# Patient Record
Sex: Female | Born: 1945 | Race: White | Hispanic: No | Smoking: Never smoker
Health system: Southern US, Community
[De-identification: ages and names within clinical notes are randomized; demographics above are authoritative.]

## PROBLEM LIST (undated history)

## (undated) DIAGNOSIS — F329 Major depressive disorder, single episode, unspecified: Secondary | ICD-10-CM

## (undated) DIAGNOSIS — R413 Other amnesia: Secondary | ICD-10-CM

## (undated) DIAGNOSIS — M1990 Unspecified osteoarthritis, unspecified site: Secondary | ICD-10-CM

## (undated) DIAGNOSIS — K7460 Unspecified cirrhosis of liver: Secondary | ICD-10-CM

## (undated) DIAGNOSIS — E6609 Other obesity due to excess calories: Secondary | ICD-10-CM

## (undated) DIAGNOSIS — I639 Cerebral infarction, unspecified: Secondary | ICD-10-CM

## (undated) DIAGNOSIS — K760 Fatty (change of) liver, not elsewhere classified: Secondary | ICD-10-CM

## (undated) DIAGNOSIS — D649 Anemia, unspecified: Secondary | ICD-10-CM

## (undated) DIAGNOSIS — F419 Anxiety disorder, unspecified: Secondary | ICD-10-CM

## (undated) DIAGNOSIS — F319 Bipolar disorder, unspecified: Secondary | ICD-10-CM

## (undated) DIAGNOSIS — I209 Angina pectoris, unspecified: Secondary | ICD-10-CM

## (undated) DIAGNOSIS — R188 Other ascites: Secondary | ICD-10-CM

## (undated) DIAGNOSIS — K449 Diaphragmatic hernia without obstruction or gangrene: Secondary | ICD-10-CM

## (undated) DIAGNOSIS — I10 Essential (primary) hypertension: Secondary | ICD-10-CM

## (undated) DIAGNOSIS — R0789 Other chest pain: Secondary | ICD-10-CM

## (undated) DIAGNOSIS — R0602 Shortness of breath: Secondary | ICD-10-CM

## (undated) DIAGNOSIS — F32A Depression, unspecified: Secondary | ICD-10-CM

## (undated) DIAGNOSIS — Z9289 Personal history of other medical treatment: Secondary | ICD-10-CM

## (undated) DIAGNOSIS — K219 Gastro-esophageal reflux disease without esophagitis: Secondary | ICD-10-CM

## (undated) DIAGNOSIS — K254 Chronic or unspecified gastric ulcer with hemorrhage: Secondary | ICD-10-CM

## (undated) HISTORY — DX: Unspecified osteoarthritis, unspecified site: M19.90

## (undated) HISTORY — DX: Bipolar disorder, unspecified: F31.9

## (undated) HISTORY — DX: Other chest pain: R07.89

## (undated) HISTORY — DX: Essential (primary) hypertension: I10

## (undated) HISTORY — DX: Anxiety disorder, unspecified: F41.9

## (undated) HISTORY — DX: Depression, unspecified: F32.A

## (undated) HISTORY — DX: Major depressive disorder, single episode, unspecified: F32.9

## (undated) HISTORY — DX: Other obesity due to excess calories: E66.09

## (undated) HISTORY — PX: KNEE ARTHROSCOPY W/ ACL RECONSTRUCTION: SHX1858

## (undated) HISTORY — DX: Other amnesia: R41.3

## (undated) HISTORY — DX: Diaphragmatic hernia without obstruction or gangrene: K44.9

---

## 1975-12-13 HISTORY — PX: TUBAL LIGATION: SHX77

## 1978-12-12 HISTORY — PX: VAGINAL HYSTERECTOMY: SUR661

## 1988-12-12 HISTORY — PX: CARDIAC CATHETERIZATION: SHX172

## 1989-08-12 HISTORY — PX: SALPINGOOPHORECTOMY: SHX82

## 1989-08-12 HISTORY — PX: CARPAL TUNNEL RELEASE: SHX101

## 1998-04-27 ENCOUNTER — Encounter: Admission: RE | Admit: 1998-04-27 | Discharge: 1998-07-26 | Payer: Self-pay | Admitting: Specialist

## 1998-11-04 ENCOUNTER — Encounter: Payer: Self-pay | Admitting: Emergency Medicine

## 1998-11-05 ENCOUNTER — Inpatient Hospital Stay (HOSPITAL_COMMUNITY): Admission: EM | Admit: 1998-11-05 | Discharge: 1998-11-07 | Payer: Self-pay

## 2000-04-25 ENCOUNTER — Encounter: Payer: Self-pay | Admitting: Cardiology

## 2000-04-25 ENCOUNTER — Encounter: Admission: RE | Admit: 2000-04-25 | Discharge: 2000-04-25 | Payer: Self-pay | Admitting: Cardiology

## 2001-01-21 ENCOUNTER — Encounter: Payer: Self-pay | Admitting: Emergency Medicine

## 2001-01-21 ENCOUNTER — Emergency Department (HOSPITAL_COMMUNITY): Admission: EM | Admit: 2001-01-21 | Discharge: 2001-01-21 | Payer: Self-pay | Admitting: Emergency Medicine

## 2001-01-23 ENCOUNTER — Other Ambulatory Visit: Admission: RE | Admit: 2001-01-23 | Discharge: 2001-01-23 | Payer: Self-pay | Admitting: Obstetrics and Gynecology

## 2001-04-26 ENCOUNTER — Encounter: Payer: Self-pay | Admitting: Obstetrics and Gynecology

## 2001-04-26 ENCOUNTER — Encounter: Admission: RE | Admit: 2001-04-26 | Discharge: 2001-04-26 | Payer: Self-pay | Admitting: Obstetrics and Gynecology

## 2001-07-03 ENCOUNTER — Encounter: Payer: Self-pay | Admitting: Gastroenterology

## 2001-07-03 ENCOUNTER — Ambulatory Visit (HOSPITAL_COMMUNITY): Admission: RE | Admit: 2001-07-03 | Discharge: 2001-07-03 | Payer: Self-pay | Admitting: Gastroenterology

## 2001-07-18 ENCOUNTER — Ambulatory Visit (HOSPITAL_COMMUNITY): Admission: RE | Admit: 2001-07-18 | Discharge: 2001-07-18 | Payer: Self-pay | Admitting: Gastroenterology

## 2001-07-18 ENCOUNTER — Encounter (INDEPENDENT_AMBULATORY_CARE_PROVIDER_SITE_OTHER): Payer: Self-pay | Admitting: Specialist

## 2002-02-12 ENCOUNTER — Other Ambulatory Visit: Admission: RE | Admit: 2002-02-12 | Discharge: 2002-02-12 | Payer: Self-pay | Admitting: Obstetrics and Gynecology

## 2002-07-09 ENCOUNTER — Encounter: Admission: RE | Admit: 2002-07-09 | Discharge: 2002-07-09 | Payer: Self-pay | Admitting: Obstetrics and Gynecology

## 2002-07-09 ENCOUNTER — Encounter: Payer: Self-pay | Admitting: Obstetrics and Gynecology

## 2002-07-12 ENCOUNTER — Ambulatory Visit (HOSPITAL_COMMUNITY): Admission: RE | Admit: 2002-07-12 | Discharge: 2002-07-12 | Payer: Self-pay | Admitting: Gastroenterology

## 2003-02-07 ENCOUNTER — Encounter: Payer: Self-pay | Admitting: Rheumatology

## 2003-02-07 ENCOUNTER — Encounter: Admission: RE | Admit: 2003-02-07 | Discharge: 2003-02-07 | Payer: Self-pay | Admitting: Rheumatology

## 2003-03-13 ENCOUNTER — Other Ambulatory Visit: Admission: RE | Admit: 2003-03-13 | Discharge: 2003-03-13 | Payer: Self-pay | Admitting: Obstetrics and Gynecology

## 2003-06-10 DIAGNOSIS — R0789 Other chest pain: Secondary | ICD-10-CM

## 2003-06-10 HISTORY — DX: Other chest pain: R07.89

## 2003-07-23 ENCOUNTER — Encounter: Payer: Self-pay | Admitting: Internal Medicine

## 2003-07-23 ENCOUNTER — Encounter: Admission: RE | Admit: 2003-07-23 | Discharge: 2003-07-23 | Payer: Self-pay | Admitting: Internal Medicine

## 2004-06-04 ENCOUNTER — Other Ambulatory Visit: Admission: RE | Admit: 2004-06-04 | Discharge: 2004-06-04 | Payer: Self-pay | Admitting: Obstetrics and Gynecology

## 2004-06-28 ENCOUNTER — Encounter: Admission: RE | Admit: 2004-06-28 | Discharge: 2004-06-28 | Payer: Self-pay | Admitting: Cardiology

## 2004-09-20 ENCOUNTER — Encounter: Admission: RE | Admit: 2004-09-20 | Discharge: 2004-09-20 | Payer: Self-pay | Admitting: Internal Medicine

## 2005-04-05 ENCOUNTER — Encounter (INDEPENDENT_AMBULATORY_CARE_PROVIDER_SITE_OTHER): Payer: Self-pay | Admitting: *Deleted

## 2005-04-05 ENCOUNTER — Ambulatory Visit (HOSPITAL_COMMUNITY): Admission: RE | Admit: 2005-04-05 | Discharge: 2005-04-05 | Payer: Self-pay | Admitting: Gastroenterology

## 2006-01-06 ENCOUNTER — Other Ambulatory Visit: Admission: RE | Admit: 2006-01-06 | Discharge: 2006-01-06 | Payer: Self-pay | Admitting: Obstetrics and Gynecology

## 2006-01-10 ENCOUNTER — Encounter: Admission: RE | Admit: 2006-01-10 | Discharge: 2006-01-10 | Payer: Self-pay | Admitting: Internal Medicine

## 2006-04-03 ENCOUNTER — Ambulatory Visit (HOSPITAL_COMMUNITY): Admission: RE | Admit: 2006-04-03 | Discharge: 2006-04-03 | Payer: Self-pay | Admitting: Internal Medicine

## 2006-05-30 ENCOUNTER — Ambulatory Visit (HOSPITAL_BASED_OUTPATIENT_CLINIC_OR_DEPARTMENT_OTHER): Admission: RE | Admit: 2006-05-30 | Discharge: 2006-05-30 | Payer: Self-pay | Admitting: Orthopedic Surgery

## 2007-01-12 ENCOUNTER — Encounter: Admission: RE | Admit: 2007-01-12 | Discharge: 2007-01-12 | Payer: Self-pay | Admitting: Internal Medicine

## 2007-03-22 ENCOUNTER — Ambulatory Visit (HOSPITAL_BASED_OUTPATIENT_CLINIC_OR_DEPARTMENT_OTHER): Admission: RE | Admit: 2007-03-22 | Discharge: 2007-03-22 | Payer: Self-pay | Admitting: Orthopedic Surgery

## 2010-02-09 ENCOUNTER — Encounter: Admission: RE | Admit: 2010-02-09 | Discharge: 2010-02-09 | Payer: Self-pay | Admitting: Gastroenterology

## 2010-03-30 ENCOUNTER — Ambulatory Visit (HOSPITAL_COMMUNITY): Admission: RE | Admit: 2010-03-30 | Discharge: 2010-03-30 | Payer: Self-pay | Admitting: Gastroenterology

## 2010-10-25 ENCOUNTER — Ambulatory Visit: Payer: Self-pay | Admitting: Cardiology

## 2011-03-27 ENCOUNTER — Other Ambulatory Visit: Payer: Self-pay | Admitting: Cardiology

## 2011-03-27 DIAGNOSIS — I10 Essential (primary) hypertension: Secondary | ICD-10-CM

## 2011-03-28 NOTE — Telephone Encounter (Signed)
Refill request

## 2011-04-29 NOTE — Procedures (Signed)
[facility]  Patient:    [name], [name]                  MRN: [id number] Proc. Date: 07/18/01 Adm. Date:  [id number] Attending:  Louie Bun CC:         Clovis Pu Patty Sermons, M.D.   Procedure Report  PROCEDURE:  Esophagogastroduodenoscopy.  SURGEON:  John C. Madilyn Fireman, M.D.  INDICATIONS FOR PROCEDURE:  Chronic diarrhea with negative workup to date with increasing refractory gastroesophageal reflux symptoms in a patient who is also undergoing colonoscopy today for rectal bleeding and as part of her diarrhea workup.  DESCRIPTION OF PROCEDURE:  The patient was placed in the left lateral decubitus position, and placed on the pulse monitor with continuous low flow oxygen delivered by nasal cannula.  She was sedated with ___ mg of IV Demerol and 7 mg of IV Versed.  The Olympus video endoscope was advanced under direct vision into the oropharynx and esophagus.  The esophagus was straight and of normal caliber with the squamocolumnar line at 38 cm.  There was a single erosion extended about 2 cm proximal to the Z-line.  There was no visible hiatal hernia, ring, or stricture.  The stomach was entered and a small amount of liquid secretions were suctioned from the fundus.  Retroflexed view of the cardia was unremarkable.  The fundus and the body appeared normal.  Within the antrum, there were multiple small elevated areas of erythema and granularity and some areas of central dimpling with small central erosions, none more than 2 mm in diameter.  The surrounding erythema was also generally erythematous and granular consistent with antral gastritis.  A CLOtest was obtained.  The duodenum was entered, and both the bulb and second portion were well-inspected, and appeared to be within normal limits.  The scope was advanced as far as possible down into the duodenum, and small bowel biopsies were obtained.  The scope was then withdrawn and the patient  returned to the recovery room in stable condition.  She tolerated the procedure well and there were no immediate complications.  IMPRESSION: 1. Moderate antral gastritis. 2. Mild esophagitis.  PLAN:  Await biopsy results and proceed with colonoscopy. DD:  07/18/01 TD:  07/19/01 Job: [id number]

## 2011-04-29 NOTE — Op Note (Signed)
[name], [name]                    ACCOUNT NO.:  [id number]   MEDICAL RECORD NO.:  [id number]                   PATIENT TYPE:  AMB   LOCATION:  ENDO                                 FACILITY:  [facility]   PHYSICIAN:  Barrie Folk, M.D.                  DATE OF BIRTH:  28-Nov-1946   DATE OF PROCEDURE:  07/12/2002  DATE OF DISCHARGE:                                 OPERATIVE REPORT   PROCEDURE:  Esophagogastroduodenoscopy with esophageal dilatation and  biopsy.   INDICATIONS FOR PROCEDURE:  Acid reflux regurgitation and dysphagia only  partially responsive to a three week trial of proton pump inhibitor.   DESCRIPTION OF PROCEDURE:  The patient was placed in the left lateral  decubitus position and placed on the pulse monitor with continuous low-flow  oxygen delivered by nasal cannula.  She was sedated with 80 mg of IV Demerol  and 8 mg of IV Versed.  The Olympus video endoscope was advanced under  direct vision into the oropharynx and esophagus.  The esophagus was slightly  tortuous but of normal caliber.  The squamocolumnar line at 36 cm above a 3-  cm hiatal hernia.  There were two or three discreet erosions with a small  amount of exudate consistent with a moderate esophagitis.  There did appear  to be a very thin, widely patent lower esophageal ring which did not present  any resistance to passage of the scope beyond it.  The stomach was entered  and a small amount of liquid secretions were suctioned from the fundus.  Retroflex view of the cardia confirmed a hiatal hernia and was otherwise  unremarkable.  The fundus and body appeared normal.  The antrum showed  streaks of erythema and granularity consistent with mild antral gastritis.  A CLOtest was obtained.  The duodenum was entered and both bulb and second  portion well inspected and appeared to be within normal limits.  The scope  was advanced as far as possible down the distal duodenum and a guidewire  passed through  the scope.  The scope was withdrawn and an 18-mm Savary  dilator was passed under fluoroscopic visualization with minimal resistance  and no blood was seen on withdrawal of the dilator.  The patient was then  returned to the recovery room in stable condition.  She tolerated the  procedure well and there were no immediate complications.   IMPRESSION:  1. Moderate-sized hiatal hernia.  2. Moderate esophagitis.  3. Widely patent lower esophageal ring.  4. Mild antral gastritis.   PLAN:  1. Continue double-dose proton pump inhibitor.  2. Await CLOtest and treat for eradication if helicobacter if positive.  3. Advance diet and observe response to dilatation in terms of dysphagia.  4. Follow up with me in a few weeks.  Barrie Folk, M.D.   JCH/MEDQ  D:  07/12/2002  T:  07/18/2002  Job:  [id number]   cc:   Thomas A. Patty Sermons, M.D.

## 2011-04-29 NOTE — Op Note (Signed)
NAMEMEOSHIA, BILLING           ACCOUNT NO.:  [id number]   MEDICAL RECORD NO.:  [id number]          PATIENT TYPE:  AMB   LOCATION:  DSC                          FACILITY:  [facility]   PHYSICIAN:  Cindee Salt, M.D.       DATE OF BIRTH:  10/19/1946   DATE OF PROCEDURE:  05/30/2006  DATE OF DISCHARGE:                                 OPERATIVE REPORT   PREOPERATIVE DIAGNOSIS:  Carpal tunnel syndrome, right hand.   POSTOPERATIVE DIAGNOSIS:  Carpal tunnel syndrome, right hand.   OPERATION:  Decompression, right median nerve.   SURGEON:  Cindee Salt, M.D.   ASSISTANT:  __________.   ANESTHESIA:  General.   HISTORY:  The patient is a 65 year old female with a history of carpal  tunnel syndrome.  EMG nerve conductions are positive.  This is not  responding to conservative treatment.  She is admitted now for carpal tunnel  release.  She is aware of risks and complications.  They have been discussed  at length with her.  Questions are encouraged and answered.   In the preoperative area, the arm was marked by both the patient and  surgeon.   PROCEDURE:  Patient was brought to the operating room, where a general  anesthetic was carried out without difficulty.  She was prepped using  DuraPrep in a supine position, right arm free.  The forearm tourniquet was  used.  The limb was exsanguinated with an esmarch bandage.  The tourniquet  placed on the forearm, was inflated to 250 mmHg.  A longitudinal incision  was made in the palm, carried down through subcutaneous tissue.  The  bleeders were electrocauterized.  Palmar fascia was split.  Superficial  palmar arteries were identified.  The flexor tendon to the ring and little  finger identified to the ulnar side of the median nerve.  The carpal  retinaculum was incised with sharp dissection.  A right angle Sewall  retractor was placed between the skin and forearm fascia.  The fascia was  released for approximately 1.5 cm proximal to the wrist  crease under direct  vision.  The canal was explored.  No further lesions were identified.  The  area of compression to the nerve was immediately apparent.  The wound was  irrigated, and skin was then closed with interrupted 5-0 nylon sutures.  Sterile compressive dressing and a splint was applied.  Patient  tolerated  the procedure well and was taken to the recovery room for observation in  satisfactory condition.  She is discharged home to return to [facility]  in [place] in one week, on Vicodin.           ______________________________  Cindee Salt, M.D.     GK/MEDQ  D:  05/30/2006  T:  05/30/2006  Job:  [id number]

## 2011-04-29 NOTE — Op Note (Signed)
[name], [name]           ACCOUNT NO.:  [id number]   MEDICAL RECORD NO.:  [id number]          PATIENT TYPE:  AMB   LOCATION:  DSC                          FACILITY:  [facility]   PHYSICIAN:  Cindee Salt, M.D.       DATE OF BIRTH:  Apr 21, 1946   DATE OF PROCEDURE:  03/22/2007  DATE OF DISCHARGE:                               OPERATIVE REPORT   PREOPERATIVE DIAGNOSIS:  Carpal tunnel syndrome, left hand.   POSTOPERATIVE DIAGNOSIS:  Carpal tunnel syndrome, left hand.   OPERATION:  Decompression, left median nerve.   SURGEON:  Cindee Salt, MD   ASSISTANT:  None.   ANESTHESIA:  General.   ANESTHESIOLOGIST:  Zenon Mayo, MD.   HISTORY:  The patient is a 65 year old female with a history of carpal  tunnel syndrome, EMG nerve conductions positive, which has not responded  to conservative treatment.  She is desirous of decompression.  Preoperative, perioperative and postoperative course are well-discussed  with the patient.  She is aware there is no guarantee with the surgery,  possibility of infection, recurrence, injury to arteries, nerves,  tendons, incomplete relief of symptoms, dystrophy.  She has elected to  proceed to have this done, questions are encouraged and answered in the  preoperative area.  She is seen, the extremity marked by both the  patient and surgeon, the antibiotic given.   PROCEDURE:  The patient was brought to the operating room, where a  general anesthetic was carried out without difficulty.  She was prepped  using DuraPrep, supine position, left arm free.  After 3-minute dry time  she was draped.  The limb was exsanguinated with an Esmarch bandage.  The tourniquet placed on the forearm was inflated to 220 mmHg.  A  longitudinal incision was made in the palm, carried down through  subcutaneous tissue.  Bleeders were electrocauterized.  Palmar fascia  was split, superficial palmar arch identified, the flexor tendon to the  ring and little finger  identified to the ulnar side of the median nerve.  The carpal retinaculum was incised with sharp dissection.  A right angle  and Sewell retractor were placed between skin and forearm fascia, the  fascia released for approximately a centimeter and half proximal to the  wrist crease under direct vision.  The canal was explored.  Tenosynovial  tissue was thickened.  An area of compression of the nerve was apparent.  No other abnormalities were noted.  The wound was irrigated.  The skin  was then closed with interrupted 5-0 Vicryl Rapide sutures.  A sterile  compressive dressing and splint was applied.  The patient tolerated the  procedure well and was taken to the recovery room for observation in  satisfactory condition.  She is discharged home to return to the [facility] in 1 week on Vicodin.          ______________________________  Cindee Salt, M.D.    GK/MEDQ  D:  03/22/2007  T:  03/22/2007  Job:  [id number]

## 2011-04-29 NOTE — Procedures (Signed)
[facility]  Patient:    [name], [name]                  MRN: [id number] Proc. Date: 07/18/01 Adm. Date:  [id number] Attending:  Louie Bun CC:         Clovis Pu Patty Sermons, M.D.   Procedure Report  PROCEDURE:  Colonoscopy with biopsy.  INDICATION FOR PROCEDURE:  Rectal bleeding and chronic diarrhea in a 65 year old patient with no previous complete colon screening.  DESCRIPTION OF PROCEDURE:  The patient was placed in the left lateral decubitus position and placed on the pulse monitor with continuous low flow oxygen delivered by nasal cannula. She was sedated with 50 mg IV Demerol and 3 mg IV Versed in addition to the 75 mg Demerol and 7 mg Versed given for the previous EGD . The Olympus video colonoscope was inserted into the rectum and advanced to the cecum, confirmed by transillumination at McBurneys point and visualization of the ileocecal valve and appendiceal orifice. The prep was excellent. The terminal ileum was intubated for several centimeters and appeared normal. The cecum, ascending, transverse, descending and sigmoid colon all appeared normal with no masses, polyps, diverticula or other mucosal abnormalities. The rectum likewise appeared normal and retroflexed view of the anus revealed some small internal hemorrhoids. Biopsies were taken to rule out collagenous colitis. The scope was then withdrawn and the patient returned to the recovery room in stable condition. The patient tolerated the procedure well and there were no immediate complications.  IMPRESSION:  Internal hemorrhoids otherwise normal colonoscopy.  PLAN:  Will await biopsies from upper and lower endoscopy and proceed with further workup and treatment as indicated for chronic diarrhea. DD:  07/18/01 TD:  07/19/01 Job: [id number]

## 2011-04-29 NOTE — Op Note (Signed)
[name], [name]           ACCOUNT NO.:  [id number]   MEDICAL RECORD NO.:  [id number]          PATIENT TYPE:  AMB   LOCATION:  ENDO                         FACILITY:  [facility]   PHYSICIAN:  John C. Madilyn Fireman, M.D.    DATE OF BIRTH:  04-29-1946   DATE OF PROCEDURE:  04/05/2005  DATE OF DISCHARGE:                                 OPERATIVE REPORT   PROCEDURE:  Esophagogastroduodenoscopy with biopsy.   INDICATIONS FOR PROCEDURE:  Epigastric abdominal pain and other atypical  dyspeptic symptoms with abdominal cramps and diarrhea.   DESCRIPTION OF PROCEDURE:  The patient was placed in the left lateral  decubitus position then placed on pulse monitor with continuous low-flow  oxygen delivered by nasal cannula. She was sedated with 75 mcg IV fentanyl  and 6 mg IV Versed. The Olympus video endoscope was advanced under direct  vision into the oropharynx and esophagus. The esophagus was straight and of  normal caliber with the squamocolumnar line at 36 cm above a 4 cm fixed  hiatal hernia. There was a little bit of fibrosis at the GE junction but no  significant restriction of the diameter at that point.  I did not appreciate  __________ was entered and a small amount of liquid secretions were  suctioned from the fundus. Retroflexed view of the cardia confirmed a hiatal  hernia and was otherwise unremarkable. The fundus and body appeared normal.  The antrum showed streaks and patches of erythema with 1 or 2 tiny erosions  without any exudate consistent with gastritis. A CLOtest was obtained. The  pylorus was nondeformed and easily allowed passage of the endoscope tip into  the duodenum. Both the bulb and second portion were well inspected appeared  to be within normal limits. Biopsies were taken of the duodenum to rule out  celiac disease light of her diarrhea and gas. The scope was then withdrawn  and the patient returned to the recovery room in stable condition. She  tolerated the procedure  well and there were no immediate complications.   IMPRESSION:  1.  Large hiatal hernia.  2.  Gastritis.   PLAN:  Will double her proton pump inhibitor dose and await CLOtest and  biopsies.      JCH/MEDQ  D:  04/05/2005  T:  04/05/2005  Job:  [id number]   cc:   Cassell Clement, M.D.  [address]  [place]  [place] [address]  Fax: [phone]   Gwen Pounds, MD  Fax: [phone]

## 2011-07-15 ENCOUNTER — Telehealth: Payer: Self-pay | Admitting: Cardiology

## 2011-07-15 NOTE — Telephone Encounter (Signed)
Called patient regarding appointment at [phone].  Number has been disconnected.

## 2011-08-01 ENCOUNTER — Other Ambulatory Visit: Payer: Self-pay | Admitting: Gastroenterology

## 2011-10-14 ENCOUNTER — Other Ambulatory Visit: Payer: Self-pay | Admitting: Cardiology

## 2011-10-14 NOTE — Telephone Encounter (Signed)
Refilled nadolol

## 2011-11-09 ENCOUNTER — Encounter: Payer: Self-pay | Admitting: Cardiology

## 2011-11-09 ENCOUNTER — Ambulatory Visit (INDEPENDENT_AMBULATORY_CARE_PROVIDER_SITE_OTHER): Payer: Medicare Other | Admitting: Cardiology

## 2011-11-09 DIAGNOSIS — R079 Chest pain, unspecified: Secondary | ICD-10-CM | POA: Insufficient documentation

## 2011-11-09 DIAGNOSIS — I119 Hypertensive heart disease without heart failure: Secondary | ICD-10-CM

## 2011-11-09 DIAGNOSIS — F411 Generalized anxiety disorder: Secondary | ICD-10-CM

## 2011-11-09 DIAGNOSIS — I10 Essential (primary) hypertension: Secondary | ICD-10-CM

## 2011-11-09 DIAGNOSIS — F419 Anxiety disorder, unspecified: Secondary | ICD-10-CM

## 2011-11-09 MED ORDER — DILTIAZEM HCL 60 MG PO TABS: 60.0000 mg | ORAL_TABLET | Freq: Two times a day (BID) | ORAL | Status: DC

## 2011-11-09 MED ORDER — NADOLOL 20 MG PO TABS: 20.0000 mg | ORAL_TABLET | Freq: Every day | ORAL | Status: DC

## 2011-11-09 NOTE — Progress Notes (Signed)
Rodolph Bong Date of Birth:  Dec 15, 1945 [facility] [address]   [address] [place], [place]  [address] [phone]           Fax   [phone]  HPI: This pleasant 65 year old woman is seen for a scheduled followup office visit she has a history of atypical chest pain.  She does not have any history of known coronary disease.  He had cardiac catheterization in 1990 showed normal coronary arteries and normal LV function.  Her last nuclear stress test was in 2004 and was a 2 day study showing no evidence of ischemia and showing normal LV function.  Patient has occasional chest tightness relieved by rest.  She has not had to take any sublingual nitroglycerin.  The patient has a history of exogenous obesity, anxiety and depression, and essential hypertension and palpitations.  Current Outpatient Prescriptions  Medication Sig Dispense Refill  . aspirin 81 MG tablet Take 81 mg by mouth daily. Takes three daily       . diltiazem (CARDIZEM) 60 MG tablet Take 1 tablet (60 mg total) by mouth 2 (two) times daily.  180 tablet  3  . doxepin (SINEQUAN) 75 MG capsule Take 75 mg by mouth.        Marland Kitchen FLUoxetine (PROZAC) 40 MG capsule Take 40 mg by mouth 2 (two) times daily.        Marland Kitchen LORAZEPAM PO Take by mouth. As needed       . Melatonin 5 MG TABS Take by mouth daily.        . metoCLOPramide (REGLAN) 10 MG tablet Take 10 mg by mouth daily.        . nadolol (CORGARD) 20 MG tablet Take 1 tablet (20 mg total) by mouth daily.  90 tablet  3  . DISCONTD: diltiazem (CARDIZEM) 60 MG tablet TAKE ONE TABLET BY MOUTH TWICE DAILY  60 tablet  11    Allergies  Allergen Reactions  . Sulfa Antibiotics     Patient Active Problem List  Diagnoses  . Benign hypertensive heart disease without heart failure  . Chest pain  . Anxiety    History  Smoking status  . Never Smoker   Smokeless tobacco  . Not on file    History  Alcohol Use: Not on file    No family history on  file.  Review of Systems: The patient denies any heat or cold intolerance.  No weight gain or weight loss.  The patient denies headaches or blurry vision.  There is no cough or sputum production.  The patient denies dizziness.  There is no hematuria or hematochezia.  The patient denies any muscle aches or arthritis.  The patient denies any rash.  The patient denies frequent falling or instability.  There is no history of depression or anxiety.  All other systems were reviewed and are negative.   Physical Exam: Filed Vitals:   11/09/11 1610  BP: 128/80  Pulse: 77   the general appearance reveals a large woman in no distress.  I rechecked her blood pressure in the right arm with a large cuff and it was 110/70.Pupils equal and reactive.   Extraocular Movements are full.  There is no scleral icterus.  The mouth and pharynx are normal.  The neck is supple.  The carotids reveal no bruits.  The jugular venous pressure is normal.  The thyroid is not enlarged.  There is no lymphadenopathy.  The chest is clear to percussion  and auscultation. There are no rales or rhonchi. Expansion of the chest is symmetrical.  The precordium is quiet.  The first heart sound is normal.  The second heart sound is physiologically split.  There is no murmur gallop rub or click.  There is no abnormal lift or heave.  The abdomen is soft and nontender. Bowel sounds are normal. The liver and spleen are not enlarged. There Are no abdominal masses. There are no bruits.  The pedal pulses are good.  There is no phlebitis or edema.  There is no cyanosis or clubbing. Neurologic is unremarkable.  Patient appears to be quite anxious.The skin is warm and dry.  There is no rash.  EKG shows normal sinus rhythm and is within normal limits    Assessment / Plan:  Continue same medication.  We sent her in new 90 day prescriptions for her diltiazem and her nadolol.  Recheck in 6 months for a followup office visit

## 2011-11-09 NOTE — Assessment & Plan Note (Signed)
The patient is on multiple psychotropic drugs and Dr. Timothy Lasso has been working with the patient to achieve the proper balance of medications.

## 2011-11-09 NOTE — Assessment & Plan Note (Signed)
The patient's blood pressure is remaining stable on current medication.  The patient is on a careful weight reduction diet and has lost 18 pounds since we last saw her.

## 2011-11-09 NOTE — Patient Instructions (Signed)
Your physician recommends that you continue on your current medications as directed. Please refer to the Current Medication list given to you today.  Your physician wants you to follow-up in: 6 months. You will receive a reminder letter in the mail two months in advance. If you don't receive a letter, please call our office to schedule the follow-up appointment.  

## 2011-11-09 NOTE — Assessment & Plan Note (Signed)
The patient has occasional chest tightness relieved by rest.  She has not had any discomfort severe enough to make her want to take her nitroglycerin.  Her electrocardiogram today is within normal limits

## 2012-05-03 ENCOUNTER — Encounter: Payer: Self-pay | Admitting: *Deleted

## 2012-05-03 DIAGNOSIS — F32A Depression, unspecified: Secondary | ICD-10-CM | POA: Insufficient documentation

## 2012-05-03 DIAGNOSIS — R413 Other amnesia: Secondary | ICD-10-CM | POA: Insufficient documentation

## 2012-05-03 DIAGNOSIS — M1990 Unspecified osteoarthritis, unspecified site: Secondary | ICD-10-CM | POA: Insufficient documentation

## 2012-05-03 DIAGNOSIS — F319 Bipolar disorder, unspecified: Secondary | ICD-10-CM | POA: Insufficient documentation

## 2012-05-03 DIAGNOSIS — E6609 Other obesity due to excess calories: Secondary | ICD-10-CM | POA: Insufficient documentation

## 2012-05-03 DIAGNOSIS — K449 Diaphragmatic hernia without obstruction or gangrene: Secondary | ICD-10-CM | POA: Insufficient documentation

## 2012-05-03 DIAGNOSIS — R0789 Other chest pain: Secondary | ICD-10-CM | POA: Insufficient documentation

## 2012-05-03 DIAGNOSIS — F329 Major depressive disorder, single episode, unspecified: Secondary | ICD-10-CM | POA: Insufficient documentation

## 2012-05-17 ENCOUNTER — Ambulatory Visit (INDEPENDENT_AMBULATORY_CARE_PROVIDER_SITE_OTHER): Payer: Medicare Other | Admitting: Cardiology

## 2012-05-17 ENCOUNTER — Encounter: Payer: Self-pay | Admitting: Cardiology

## 2012-05-17 VITALS — BP 118/82 | HR 80 | Ht 66.0 in | Wt 270.0 lb

## 2012-05-17 DIAGNOSIS — F329 Major depressive disorder, single episode, unspecified: Secondary | ICD-10-CM

## 2012-05-17 DIAGNOSIS — F341 Dysthymic disorder: Secondary | ICD-10-CM

## 2012-05-17 DIAGNOSIS — R079 Chest pain, unspecified: Secondary | ICD-10-CM

## 2012-05-17 DIAGNOSIS — I119 Hypertensive heart disease without heart failure: Secondary | ICD-10-CM

## 2012-05-17 NOTE — Patient Instructions (Signed)
Your physician recommends that you continue on your current medications as directed. Please refer to the Current Medication list given to you today.  Your physician wants you to follow-up in: 6 months. You will receive a reminder letter in the mail two months in advance. If you don't receive a letter, please call our office to schedule the follow-up appointment.  

## 2012-05-17 NOTE — Assessment & Plan Note (Signed)
Patient remains extremely anxious.  She is no longer seeing psychiatry.  Dr. Timothy Lasso is helping her with her nerve medication now.

## 2012-05-17 NOTE — Progress Notes (Signed)
[name] Date of Birth:  Dec 17, 1945 [facility] [address] [place], [place]  [address] [phone]  Fax   [phone]  HPI: This pleasant 66 year old woman is seen for a six-month followup office visit.  She has a past history of atypical chest pain.  She does not have any history of known coronary disease.  She did have a normal cardiac catheterization in 1990.  She had a normal nuclear stress test in 2004.  She has normal LV function.  Patient has a problem with severe anxiety and depression as well as exogenous obesity and she has a history of essential hypertension and palpitations.  Current Outpatient Prescriptions  Medication Sig Dispense Refill  . aspirin 81 MG tablet Take 81 mg by mouth daily. Takes three daily       . diltiazem (CARDIZEM) 60 MG tablet Take 1 tablet (60 mg total) by mouth 2 (two) times daily.  180 tablet  3  . doxepin (SINEQUAN) 75 MG capsule Take 75 mg by mouth.        Marland Kitchen FLUoxetine (PROZAC) 40 MG capsule Take 40 mg by mouth 2 (two) times daily. Per Dr.Pulis      . hydrochlorothiazide (HYDRODIURIL) 25 MG tablet Take 25 mg by mouth daily. As needed      . LORAZEPAM PO Take by mouth. As needed       . Melatonin 5 MG TABS Take by mouth daily.        . metoCLOPramide (REGLAN) 10 MG tablet Take 10 mg by mouth daily.        . Multiple Vitamin (MULTIVITAMIN) tablet Take 1 tablet by mouth daily.      . nadolol (CORGARD) 20 MG tablet Take 1 tablet (20 mg total) by mouth daily.  90 tablet  3    Allergies  Allergen Reactions  . Sulfa Antibiotics     Patient Active Problem List  Diagnoses  . Benign hypertensive heart disease without heart failure  . Chest pain  . Anxiety  . Exogenous obesity  . Anxiety and depression  . Hiatal hernia  . Osteoarthritis  . Atypical chest pain  . Memory changes  . Bipolar disorder    History  Smoking status  . Never Smoker   Smokeless tobacco  . Not on file    History  Alcohol Use:  Not on file    Family History  Problem Relation Age of Onset  . Heart attack Mother   . Heart disease Mother   . Heart disease Father   . Heart attack Father     Review of Systems: The patient denies any heat or cold intolerance.  No weight gain or weight loss.  The patient denies headaches or blurry vision.  There is no cough or sputum production.  The patient denies dizziness.  There is no hematuria or hematochezia.  The patient denies any muscle aches or arthritis.  The patient denies any rash.  The patient denies frequent falling or instability.  There is no history of depression or anxiety.  All other systems were reviewed and are negative.   Physical Exam: Filed Vitals:   05/17/12 1634  BP: 118/82  Pulse: 80   the general appearance reveals a large woman in no acute distress.The head and neck exam reveals pupils equal and reactive.  Extraocular movements are full.  There is no scleral icterus.  The mouth and pharynx are normal.  The neck is supple.  The carotids reveal no bruits.  The jugular venous pressure is normal.  The  thyroid is not enlarged.  There is no lymphadenopathy.  The chest is clear to percussion and auscultation.  There are no rales or rhonchi.  Expansion of the chest is symmetrical.  There is no localized chest wall tenderness.  The precordium is quiet.  The first heart sound is normal.  The second heart sound is physiologically split.  There is no murmur gallop rub or click.  There is no abnormal lift or heave.  The abdomen is soft and nontender.  The bowel sounds are normal.  The liver and spleen are not enlarged.  There are no abdominal masses.  There are no abdominal bruits.  Extremities reveal good pedal pulses.  There is no phlebitis or edema.  There is no cyanosis or clubbing.  Strength is normal and symmetrical in all extremities.  There is no lateralizing weakness.  There are no sensory deficits.  The skin is warm and dry.  There is no rash.      Assessment  / Plan: Noncardiac chest wall pain.  Patient was reassured.  She has gained 14 pounds since last visit and is going to try harder with her diet.  She'll return in 6 months for followup office visit.

## 2012-05-17 NOTE — Assessment & Plan Note (Signed)
The patient has a history of atypical chest pain.  Her pains often occur at rest and are not related to any particular activity.  The pain is localized under the left breast.  It lasts anywhere from 1 second to 1 minute and is relieved by lying down.  There is no arm radiation no diaphoresis and there is no increased shortness of breath.  She has not been having any dizziness or syncope.

## 2012-05-17 NOTE — Assessment & Plan Note (Signed)
Pressure has been remaining stable on current therapy.  No headaches.  No change of vision.  No dizziness or syncope.

## 2012-08-20 ENCOUNTER — Telehealth: Payer: Self-pay | Admitting: Cardiology

## 2012-08-20 DIAGNOSIS — I119 Hypertensive heart disease without heart failure: Secondary | ICD-10-CM

## 2012-08-20 NOTE — Telephone Encounter (Signed)
New problem.    Nadolol is too expensive. Please advise on alternative.

## 2012-08-20 NOTE — Telephone Encounter (Signed)
Left msg with pt home number that DR/Nurse out of office and will return call with recommendations, this is not a new med, pt has been on medication for a long time.

## 2012-08-21 ENCOUNTER — Telehealth: Payer: Self-pay | Admitting: Cardiology

## 2012-08-21 ENCOUNTER — Telehealth: Payer: Self-pay | Admitting: *Deleted

## 2012-08-21 MED ORDER — NADOLOL 20 MG PO TABS: 20.0000 mg | ORAL_TABLET | Freq: Every day | ORAL | Status: DC

## 2012-08-21 NOTE — Telephone Encounter (Signed)
Pt would like a return call to discuss possible med alternatives, she can be reached at [phone]

## 2012-08-21 NOTE — Telephone Encounter (Signed)
Left message at [facility], Rx called to another pharmacy

## 2012-08-21 NOTE — Telephone Encounter (Signed)
New Problem:    Patient called in wanting to speak with you about her prescription.  Patient would prefer to have her prescription filled at the [facility] on [address]. Please call back if you have any questions.

## 2012-08-21 NOTE — Telephone Encounter (Signed)
Left message to call [facility] she wants it to be filled and they can get it transferred (not sure which [facility] she wanted it filled at)

## 2012-08-21 NOTE — Telephone Encounter (Signed)
Pt pharmacy called wanting an alternate instead of nadolol please call back this number [phone].

## 2012-11-14 ENCOUNTER — Other Ambulatory Visit: Payer: Self-pay

## 2012-11-14 DIAGNOSIS — I119 Hypertensive heart disease without heart failure: Secondary | ICD-10-CM

## 2012-11-14 DIAGNOSIS — I10 Essential (primary) hypertension: Secondary | ICD-10-CM

## 2012-11-14 MED ORDER — DILTIAZEM HCL 60 MG PO TABS: 60.0000 mg | ORAL_TABLET | Freq: Two times a day (BID) | ORAL | Status: DC

## 2012-11-14 MED ORDER — METOPROLOL TARTRATE 25 MG PO TABS
25.0000 mg | ORAL_TABLET | Freq: Two times a day (BID) | ORAL | Status: DC
Start: 2012-11-14 — End: 2013-04-21

## 2012-11-14 NOTE — Telephone Encounter (Signed)
1) Nadolol is too expensive now would like alternative, will forward to  Dr. Patty Sermons for review  2)Needs refill of diltiazem

## 2012-11-14 NOTE — Telephone Encounter (Signed)
Advised patient

## 2012-11-14 NOTE — Telephone Encounter (Signed)
Stop nadolol and began generic Lopressor 25 mg twice a day.

## 2012-11-15 ENCOUNTER — Other Ambulatory Visit: Payer: Self-pay

## 2013-01-29 ENCOUNTER — Encounter: Payer: Self-pay | Admitting: Cardiology

## 2013-04-21 ENCOUNTER — Emergency Department (HOSPITAL_COMMUNITY): Payer: Medicare Other

## 2013-04-21 ENCOUNTER — Inpatient Hospital Stay (HOSPITAL_COMMUNITY)
Admission: EM | Admit: 2013-04-21 | Discharge: 2013-04-25 | DRG: 378 | Disposition: A | Discharge status: Skilled Nursing Facility | Payer: Medicare Other | Attending: Internal Medicine | Admitting: Internal Medicine

## 2013-04-21 ENCOUNTER — Encounter (HOSPITAL_COMMUNITY): Payer: Self-pay | Admitting: *Deleted

## 2013-04-21 DIAGNOSIS — I119 Hypertensive heart disease without heart failure: Secondary | ICD-10-CM | POA: Diagnosis present

## 2013-04-21 DIAGNOSIS — F319 Bipolar disorder, unspecified: Secondary | ICD-10-CM | POA: Diagnosis present

## 2013-04-21 DIAGNOSIS — K2960 Other gastritis without bleeding: Secondary | ICD-10-CM | POA: Diagnosis present

## 2013-04-21 DIAGNOSIS — M1710 Unilateral primary osteoarthritis, unspecified knee: Secondary | ICD-10-CM | POA: Diagnosis present

## 2013-04-21 DIAGNOSIS — S0990XA Unspecified injury of head, initial encounter: Secondary | ICD-10-CM

## 2013-04-21 DIAGNOSIS — Z6841 Body Mass Index (BMI) 40.0 and over, adult: Secondary | ICD-10-CM

## 2013-04-21 DIAGNOSIS — Z8249 Family history of ischemic heart disease and other diseases of the circulatory system: Secondary | ICD-10-CM

## 2013-04-21 DIAGNOSIS — R296 Repeated falls: Secondary | ICD-10-CM

## 2013-04-21 DIAGNOSIS — R531 Weakness: Secondary | ICD-10-CM

## 2013-04-21 DIAGNOSIS — Z79899 Other long term (current) drug therapy: Secondary | ICD-10-CM

## 2013-04-21 DIAGNOSIS — F329 Major depressive disorder, single episode, unspecified: Secondary | ICD-10-CM | POA: Diagnosis present

## 2013-04-21 DIAGNOSIS — D649 Anemia, unspecified: Secondary | ICD-10-CM

## 2013-04-21 DIAGNOSIS — Z8673 Personal history of transient ischemic attack (TIA), and cerebral infarction without residual deficits: Secondary | ICD-10-CM

## 2013-04-21 DIAGNOSIS — D62 Acute posthemorrhagic anemia: Secondary | ICD-10-CM | POA: Diagnosis present

## 2013-04-21 DIAGNOSIS — I639 Cerebral infarction, unspecified: Secondary | ICD-10-CM | POA: Insufficient documentation

## 2013-04-21 DIAGNOSIS — B3781 Candidal esophagitis: Secondary | ICD-10-CM | POA: Diagnosis present

## 2013-04-21 DIAGNOSIS — F411 Generalized anxiety disorder: Secondary | ICD-10-CM | POA: Diagnosis present

## 2013-04-21 DIAGNOSIS — K254 Chronic or unspecified gastric ulcer with hemorrhage: Principal | ICD-10-CM | POA: Diagnosis present

## 2013-04-21 DIAGNOSIS — F3289 Other specified depressive episodes: Secondary | ICD-10-CM | POA: Diagnosis present

## 2013-04-21 DIAGNOSIS — W19XXXA Unspecified fall, initial encounter: Secondary | ICD-10-CM

## 2013-04-21 DIAGNOSIS — K922 Gastrointestinal hemorrhage, unspecified: Secondary | ICD-10-CM

## 2013-04-21 DIAGNOSIS — Z9181 History of falling: Secondary | ICD-10-CM

## 2013-04-21 DIAGNOSIS — K5900 Constipation, unspecified: Secondary | ICD-10-CM | POA: Diagnosis present

## 2013-04-21 DIAGNOSIS — K449 Diaphragmatic hernia without obstruction or gangrene: Secondary | ICD-10-CM | POA: Diagnosis present

## 2013-04-21 DIAGNOSIS — Z90710 Acquired absence of both cervix and uterus: Secondary | ICD-10-CM

## 2013-04-21 HISTORY — DX: Cerebral infarction, unspecified: I63.9

## 2013-04-21 LAB — OCCULT BLOOD, POC DEVICE: Fecal Occult Bld: POSITIVE — AB

## 2013-04-21 LAB — CBC WITH DIFFERENTIAL/PLATELET
Eosinophils Absolute: 0.1 10*3/uL (ref 0.0–0.7)
Eosinophils Relative: 3 % (ref 0–5)
HCT: 27.7 % — ABNORMAL LOW (ref 36.0–46.0)
Hemoglobin: 8.5 g/dL — ABNORMAL LOW (ref 12.0–15.0)
Lymphocytes Relative: 19 % (ref 12–46)
Lymphs Abs: 0.9 10*3/uL (ref 0.7–4.0)
MCHC: 30.7 g/dL (ref 30.0–36.0)
Monocytes Relative: 9 % (ref 3–12)
Neutro Abs: 3 10*3/uL (ref 1.7–7.7)

## 2013-04-21 LAB — URINALYSIS, ROUTINE W REFLEX MICROSCOPIC
Glucose, UA: NEGATIVE mg/dL
Protein, ur: NEGATIVE mg/dL
Specific Gravity, Urine: 1.041 — ABNORMAL HIGH (ref 1.005–1.030)

## 2013-04-21 LAB — URINE MICROSCOPIC-ADD ON

## 2013-04-21 LAB — PREPARE RBC (CROSSMATCH)

## 2013-04-21 LAB — BASIC METABOLIC PANEL
BUN: 17 mg/dL (ref 6–23)
Chloride: 107 mEq/L (ref 96–112)
GFR calc non Af Amer: 87 mL/min — ABNORMAL LOW (ref >90)

## 2013-04-21 MED ORDER — ENOXAPARIN SODIUM 40 MG/0.4ML ~~LOC~~ SOLN
40.0000 mg | SUBCUTANEOUS | Status: DC
  Filled 2013-04-21 (×2): qty 0.4

## 2013-04-21 MED ORDER — ASPIRIN 81 MG PO CHEW
81.0000 mg | CHEWABLE_TABLET | Freq: Every day | ORAL | Status: DC
  Administered 2013-04-21: 81 mg via ORAL
  Filled 2013-04-21 (×2): qty 1

## 2013-04-21 MED ORDER — PANTOPRAZOLE SODIUM 40 MG PO TBEC: 40.0000 mg | DELAYED_RELEASE_TABLET | Freq: Two times a day (BID) | ORAL | Status: DC

## 2013-04-21 MED ORDER — SODIUM CHLORIDE 0.9 % IV BOLUS (SEPSIS): 1000.0000 mL | Freq: Once | INTRAVENOUS | Status: DC

## 2013-04-21 MED ORDER — DILTIAZEM HCL 60 MG PO TABS
60.0000 mg | ORAL_TABLET | Freq: Two times a day (BID) | ORAL | Status: DC
  Administered 2013-04-21 – 2013-04-25 (×8): 60 mg via ORAL
  Filled 2013-04-21 (×9): qty 1

## 2013-04-21 MED ORDER — NADOLOL 20 MG PO TABS
20.0000 mg | ORAL_TABLET | Freq: Every day | ORAL | Status: DC
  Administered 2013-04-21 – 2013-04-25 (×5): 20 mg via ORAL
  Filled 2013-04-21 (×5): qty 1

## 2013-04-21 MED ORDER — ACETAMINOPHEN 650 MG RE SUPP: 650.0000 mg | Freq: Four times a day (QID) | RECTAL | Status: DC | PRN

## 2013-04-21 MED ORDER — METOCLOPRAMIDE HCL 10 MG PO TABS
10.0000 mg | ORAL_TABLET | Freq: Every day | ORAL | Status: DC
  Administered 2013-04-21 – 2013-04-25 (×4): 10 mg via ORAL
  Filled 2013-04-21 (×5): qty 1

## 2013-04-21 MED ORDER — DOXEPIN HCL 75 MG PO CAPS
75.0000 mg | ORAL_CAPSULE | Freq: Every day | ORAL | Status: DC
  Administered 2013-04-21 – 2013-04-24 (×4): 75 mg via ORAL
  Filled 2013-04-21 (×5): qty 1

## 2013-04-21 MED ORDER — LORAZEPAM 1 MG PO TABS
1.0000 mg | ORAL_TABLET | Freq: Two times a day (BID) | ORAL | Status: DC
  Administered 2013-04-21 – 2013-04-24 (×6): 1 mg via ORAL
  Filled 2013-04-21 (×6): qty 1

## 2013-04-21 MED ORDER — POLYETHYLENE GLYCOL 3350 17 G PO PACK
17.0000 g | PACK | Freq: Every day | ORAL | Status: DC | PRN
  Administered 2013-04-24: 17 g via ORAL
  Filled 2013-04-21: qty 1

## 2013-04-21 MED ORDER — FLUOXETINE HCL 20 MG PO CAPS
40.0000 mg | ORAL_CAPSULE | Freq: Two times a day (BID) | ORAL | Status: DC
  Administered 2013-04-21 – 2013-04-25 (×8): 40 mg via ORAL
  Filled 2013-04-21 (×4): qty 2
  Filled 2013-04-21: qty 1
  Filled 2013-04-21 (×5): qty 2

## 2013-04-21 MED ORDER — ZOLPIDEM TARTRATE 5 MG PO TABS
5.0000 mg | ORAL_TABLET | Freq: Every evening | ORAL | Status: DC | PRN
  Filled 2013-04-21: qty 1

## 2013-04-21 MED ORDER — ADULT MULTIVITAMIN W/MINERALS CH
1.0000 | ORAL_TABLET | Freq: Every day | ORAL | Status: DC
  Administered 2013-04-21 – 2013-04-25 (×4): 1 via ORAL
  Filled 2013-04-21 (×5): qty 1

## 2013-04-21 MED ORDER — SODIUM CHLORIDE 0.9 % IV SOLN
INTRAVENOUS | Status: DC
  Administered 2013-04-23: 13:00:00 via INTRAVENOUS

## 2013-04-21 MED ORDER — ACETAMINOPHEN 325 MG PO TABS
650.0000 mg | ORAL_TABLET | Freq: Four times a day (QID) | ORAL | Status: DC | PRN
  Administered 2013-04-22: 650 mg via ORAL
  Filled 2013-04-21: qty 2

## 2013-04-21 MED ORDER — ASPIRIN 81 MG PO TABS: 81.0000 mg | ORAL_TABLET | Freq: Every day | ORAL | Status: DC

## 2013-04-21 MED ORDER — ONE-DAILY MULTI VITAMINS PO TABS: 1.0000 | ORAL_TABLET | Freq: Every day | ORAL | Status: DC

## 2013-04-21 MED ORDER — HYDROCODONE-ACETAMINOPHEN 5-325 MG PO TABS
2.0000 | ORAL_TABLET | Freq: Once | ORAL | Status: AC
  Administered 2013-04-21: 2 via ORAL
  Filled 2013-04-21: qty 2

## 2013-04-21 MED ORDER — FUROSEMIDE 10 MG/ML IJ SOLN
20.0000 mg | Freq: Once | INTRAMUSCULAR | Status: AC
  Administered 2013-04-21: 20 mg via INTRAVENOUS
  Filled 2013-04-21: qty 2

## 2013-04-21 NOTE — ED Notes (Signed)
Pt states she would prefer not to get an IV, MD Bednar aware.

## 2013-04-21 NOTE — ED Notes (Signed)
Pt in via EMS, per EMS, pt in after multiple falls in the last two days, pt fell last night around 10pm and didn't call anyone, neighbor found her this am. Pt is concerned that family will put her in a nursing home so she didn't call anyone last night. Pt states she hit her head but denies LOC. Pt was on the floor for approx 14 hours. Also c/o room spinning when she was sat up the first time and states that is a new sensation. Pt alert and oriented at this time. States she normally gets around at home with a walker but if she falls she is unable to get herself back up.

## 2013-04-21 NOTE — ED Provider Notes (Signed)
History     CSN: [id number]  Arrival date & time 04/21/13  1247   First MD Initiated Contact with Patient 04/21/13 1249      Chief Complaint  Patient presents with  . Fall    (Consider location/radiation/quality/duration/timing/severity/associated sxs/prior treatment) HPI This 67 year old female has baseline is morbidly obese and has generalized weakness with multiple falls, she has a walker at home, she wants to continue living alone, she does not have any life alert notification system but her neighbor checks on her once a day, last night her knees buckled when she was walking which is typical for her and she fell and did not injure herself except for hitting her head which caused some mild intermittent positional vertigo with only mild headache with no severe headache no amnesia no loss of consciousness no neck pain no change in her chronic low back pain no chest pain no shortness breath no pelvic pain no vomiting no diarrhea no change in speech vision swallowing or understanding and no new focal or lateralizing weakness numbness or incoordination but since she is too weak at baseline to get up from the floor when she falls she did weight on the floor all night until her neighbor checked on her today and to call EMS to pick the patient up because she is so heavy. When the patient sat up with EMS and moved her head she felt as if the room was spinning a little bit with some mild intermittent positional vertigo which she has had since hitting her head when she fell. She did not have vertigo before falling and hitting her head. She is no severe headache. There is no treatment prior to arrival. Past Medical History  Diagnosis Date  . Exogenous obesity   . Hypertension   . Anxiety and depression     chronic  . Hiatal hernia   . Osteoarthritis   . Atypical chest pain 06/10/03    normal 2 day adenosine cardiolite  . Memory changes   . Bipolar disorder     Past Surgical History  Procedure  Laterality Date  . Tubal ligation  1977  . Other surgical history  1980    hysterectomy  . Cardiac catheterization  1990    normal cath  . Esophagogastroduodenoscopy N/A 04/22/2013    Procedure: ESOPHAGOGASTRODUODENOSCOPY (EGD);  Surgeon: Shirley Friar, MD;  Location: [facility] ENDOSCOPY;  Service: Endoscopy;  Laterality: N/A;    Family History  Problem Relation Age of Onset  . Heart attack Mother   . Heart disease Mother   . Heart disease Father   . Heart attack Father     History  Substance Use Topics  . Smoking status: Never Smoker   . Smokeless tobacco: Never Used  . Alcohol Use: No    OB History   Grav Para Term Preterm Abortions TAB SAB Ect Mult Living                  Review of Systems 10 Systems reviewed and are negative for acute change except as noted in the HPI. Allergies  Codeine and Sulfa antibiotics  Home Medications   Current Outpatient Rx  Name  Route  Sig  Dispense  Refill  . diltiazem (CARDIZEM) 60 MG tablet   Oral   Take 1 tablet (60 mg total) by mouth 2 (two) times daily.   180 tablet   3   . doxepin (SINEQUAN) 75 MG capsule   Oral   Take 75 mg by mouth.           Marland Kitchen  FLUoxetine (PROZAC) 40 MG capsule   Oral   Take 40 mg by mouth 2 (two) times daily. Per Dr.Pulis         . Melatonin 3 MG CAPS   Oral   Take 1 capsule by mouth daily.         . metoCLOPramide (REGLAN) 10 MG tablet   Oral   Take 10 mg by mouth daily.           . Multiple Vitamin (MULTIVITAMIN) tablet   Oral   Take 1 tablet by mouth daily.         . nadolol (CORGARD) 20 MG tablet   Oral   Take 20 mg by mouth daily.         Marland Kitchen acetaminophen (TYLENOL) 325 MG tablet   Oral   Take 2 tablets (650 mg total) by mouth every 6 (six) hours as needed.         Marland Kitchen aspirin 81 MG tablet      Take 1 tablet (81 mg total) by mouth daily. Restart on June 10   30 tablet      . docusate sodium 100 MG CAPS   Oral   Take 100 mg by mouth daily.   10 capsule   0   .  nystatin (MYCOSTATIN) 100000 UNIT/ML suspension   Oral   Take 5 mLs (500,000 Units total) by mouth 4 (four) times daily.   60 mL   0   . pantoprazole (PROTONIX) 40 MG tablet   Oral   Take 1 tablet (40 mg total) by mouth 2 (two) times daily.         . polyethylene glycol (MIRALAX / GLYCOLAX) packet   Oral   Take 17 g by mouth daily as needed.   14 each   0     BP 127/68  Pulse 76  Temp(Src) 98.4 F (36.9 C) (Oral)  Resp 20  Ht 5\' 6"  (1.676 m)  Wt 280 lb (127.007 kg)  BMI 45.21 kg/m2  SpO2 94%  Physical Exam  Nursing note and vitals reviewed. Constitutional:  Awake, alert, nontoxic appearance with baseline speech for patient.  HENT:  Head: Atraumatic.  Mouth/Throat: No oropharyngeal exudate.  Oropharynx noninjected dry mucosal membranes which the patient states is chronic from her medications  Eyes: EOM are normal. Pupils are equal, round, and reactive to light. Right eye exhibits no discharge. Left eye exhibits no discharge.  No nystagmus noted with extraocular movements and no disconjugate gaze with negative test of skew  Neck: Neck supple.  Cervical spine nontender  Cardiovascular: Normal rate and regular rhythm.   No murmur heard. Pulmonary/Chest: Effort normal and breath sounds normal. No stridor. No respiratory distress. She has no wheezes. She has no rales. She exhibits no tenderness.  Abdominal: Soft. Bowel sounds are normal. She exhibits no mass. There is no tenderness. There is no rebound.  Musculoskeletal: She exhibits tenderness.  Baseline ROM, moves extremities with no obvious new focal weakness. Chronic right shoulder tenderness which is baseline for the patient, chronic diffuse lumbar tenderness which is baseline for the patient.  Lymphadenopathy:    She has no cervical adenopathy.  Neurological: She is alert.  Awake, alert, cooperative and aware of situation; motor strength 4/5 bilaterally; sensation normal to light touch bilaterally; peripheral visual  fields full to confrontation; no facial asymmetry; tongue midline; major cranial nerves appear intact; no pronator drift, normal finger to nose bilaterally  Skin: No rash noted.  Psychiatric: She has a  normal mood and affect.    ED Course  Procedures (including critical care time) Hgb 8.5 daughter states last Hgb 11 within last 2 months, no melena, stool brown, chaparone present for sample of stool from Pt's diaper sent for occult blood. GMA paged after stool heme positive. 1600 D/w Jacky Kindle who will check office records.1607 GMA to admit.1645 Patient / Family / Caregiver informed of clinical course, understand medical decision-making process, and agree with plan.  Labs Reviewed  URINALYSIS, ROUTINE W REFLEX MICROSCOPIC - Abnormal; Notable for the following:    Color, Urine ORANGE (*)    APPearance CLOUDY (*)    Specific Gravity, Urine 1.041 (*)    Bilirubin Urine SMALL (*)    Ketones, ur 15 (*)    Leukocytes, UA TRACE (*)    All other components within normal limits  CBC WITH DIFFERENTIAL - Abnormal; Notable for the following:    RBC 3.02 (*)    Hemoglobin 8.5 (*)    HCT 27.7 (*)    RDW 16.4 (*)    Platelets 132 (*)    All other components within normal limits  BASIC METABOLIC PANEL - Abnormal; Notable for the following:    Potassium 3.4 (*)    GFR calc non Af Amer 87 (*)    All other components within normal limits  URINE MICROSCOPIC-ADD ON - Abnormal; Notable for the following:    Crystals CA OXALATE CRYSTALS (*)    All other components within normal limits  COMPREHENSIVE METABOLIC PANEL - Abnormal; Notable for the following:    Total Protein 5.3 (*)    Albumin 2.4 (*)    AST 39 (*)    Total Bilirubin 3.0 (*)    GFR calc non Af Amer 88 (*)    All other components within normal limits  CBC - Abnormal; Notable for the following:    WBC 2.7 (*)    RBC 3.33 (*)    Hemoglobin 9.4 (*)    HCT 29.6 (*)    RDW 16.6 (*)    Platelets 94 (*)    All other components within  normal limits  CBC - Abnormal; Notable for the following:    WBC 3.2 (*)    RBC 3.43 (*)    Hemoglobin 9.6 (*)    HCT 30.7 (*)    RDW 16.7 (*)    Platelets 89 (*)    All other components within normal limits  BASIC METABOLIC PANEL - Abnormal; Notable for the following:    GFR calc non Af Amer 74 (*)    GFR calc Af Amer 86 (*)    All other components within normal limits  CBC - Abnormal; Notable for the following:    RBC 3.57 (*)    Hemoglobin 10.2 (*)    HCT 31.9 (*)    RDW 16.1 (*)    Platelets 89 (*)    All other components within normal limits  BASIC METABOLIC PANEL - Abnormal; Notable for the following:    Glucose, Bld 105 (*)    GFR calc non Af Amer 74 (*)    GFR calc Af Amer 86 (*)    All other components within normal limits  CBC - Abnormal; Notable for the following:    WBC 3.6 (*)    RBC 3.67 (*)    Hemoglobin 10.2 (*)    HCT 33.0 (*)    RDW 16.2 (*)    Platelets 93 (*)    All other components within normal limits  BASIC METABOLIC PANEL - Abnormal; Notable for the following:    Glucose, Bld 106 (*)    GFR calc non Af Amer 87 (*)    All other components within normal limits  OCCULT BLOOD, POC DEVICE - Abnormal; Notable for the following:    Fecal Occult Bld POSITIVE (*)    All other components within normal limits  CK  H. PYLORI ANTIBODY, IGG  GLUCOSE, CAPILLARY  PREPARE RBC (CROSSMATCH)  TYPE AND SCREEN  ABO/RH  CYTOLOGY - NON PAP   No results found.   1. Anemia   2. GI bleed   3. Weakness   4. Fall, initial encounter   5. Minor head injury without loss of consciousness, initial encounter       MDM  The patient appears reasonably stabilized for admission considering the current resources, flow, and capabilities available in the ED at this time, and I doubt any other [facility] requiring further screening and/or treatment in the ED prior to admission.        Hurman Horn, MD 05/06/13 [phone]

## 2013-04-21 NOTE — Progress Notes (Signed)
Received patient from ED via strecher , accompanied by daughters. Patient is alert and oriented, not in any distress, vitals signs stable, oriented to unit,bed alarm activated., plan of care discussed with patient and daughter. Will endorse appropriately .

## 2013-04-21 NOTE — H&P (Signed)
PCP:   No primary provider on file.   Chief Complaint:  Weakness and multiple falls  HPI: Patient is a pleasant 67 year old with essential hypertension, morbid obesity, osteoarthritis, depression and chronic anxiety/depression presenting at this time with multiple falls and weakness having been found on the floor for at least 12 hours. Daughters are at the bedside and she's had multiple falls on and off for quite some time according to them. Placement has indeed been urged by Dr. Timothy Lasso evidently she has declined this. She has been her baseline health but her mobility is impacted both by the osteoarthritis of flexing her knees as well as her morbid obesity. She arty sustained an injury falling off a porch within the last week injuring her right shoulder. At this point she sustained a fall last evening in her bedroom was on the floor all night long for at least 12 hours until her fiance summoned EMS. She had no loss of consciousness and no loss of bowel or bladder. She denies any chest pain or shortness of breath. She does have chronic knee problems because of arthritis. She's had no change in bowel or bladder and no nausea or vomiting. She denies any black tarry stools. Last lab work was in early April at [facility] with a hemoglobin documented at 11.3. Between being found in the floor and unable to get up with profound weakness, hemoglobin is markedly depressed and heme-positive stools she is being admitted for hydration, transfusion of packed red blood cells and at this point social worker will see her regarding potential placement of either assisted living or skilled short-term   Past Medical History: Past Medical History  Diagnosis Date  . Exogenous obesity   . Hypertension   . Anxiety and depression     chronic  . Hiatal hernia   . Osteoarthritis   . Atypical chest pain 06/10/03    normal 2 day adenosine cardiolite  . Memory changes   . Bipolar disorder    Past Surgical History   Procedure Laterality Date  . Tubal ligation  1977  . Other surgical history  1980    hysterectomy  . Cardiac catheterization  1990    normal cath    Medications: Prior to Admission medications   Medication Sig Start Date End Date Taking? Authorizing Provider  aspirin 81 MG tablet Take 81 mg by mouth daily. Takes three daily    Yes Historical Provider, MD  diltiazem (CARDIZEM) 60 MG tablet Take 1 tablet (60 mg total) by mouth 2 (two) times daily. 11/14/12  Yes Cassell Clement, MD  doxepin (SINEQUAN) 75 MG capsule Take 75 mg by mouth.     Yes Historical Provider, MD  FLUoxetine (PROZAC) 40 MG capsule Take 40 mg by mouth 2 (two) times daily. Per Dr.Pulis   Yes Historical Provider, MD  LORazepam (ATIVAN) 1 MG tablet Take 1 mg by mouth 2 (two) times daily.   Yes Historical Provider, MD  Melatonin 3 MG CAPS Take 1 capsule by mouth daily.   Yes Historical Provider, MD  metoCLOPramide (REGLAN) 10 MG tablet Take 10 mg by mouth daily.     Yes Historical Provider, MD  Multiple Vitamin (MULTIVITAMIN) tablet Take 1 tablet by mouth daily.   Yes Historical Provider, MD  nadolol (CORGARD) 20 MG tablet Take 20 mg by mouth daily.   Yes Historical Provider, MD    Allergies:   Allergies  Allergen Reactions  . Codeine     itching  . Sulfa Antibiotics  Pull my hair out    Social History:  reports that she has never smoked. She does not have any smokeless tobacco history on file. Her alcohol and drug histories are not on file.  Family History: Family History  Problem Relation Age of Onset  . Heart attack Mother   . Heart disease Mother   . Heart disease Father   . Heart attack Father     Physical Exam: Filed Vitals:   04/21/13 1257  BP: 100/38  Pulse: 86  Temp: 98 F (36.7 C)  TempSrc: Oral  Resp: 20  SpO2: 99%   General appearance: alert, cooperative and no distress Head: Normocephalic, without obvious abnormality, atraumatic Eyes: conjunctivae/corneas clear. PERRL, EOM's  intact.  Nose: Nares normal. Septum midline. Mucosa normal. No drainage or sinus tenderness. Throat: lips, mucosa, and tongue normal; teeth and gums normal Neck: no adenopathy, no carotid bruit, no JVD and thyroid not enlarged, symmetric, no tenderness/mass/nodules Resp: clear to auscultation bilaterally Cardio: regular rate and rhythm, S1, S2 normal, no murmur, click, rub or gallop GI: soft, non-tender; bowel sounds normal; no masses,  no organomegaly, morbidly obese Extremities: extremities normal, atraumatic, no cyanosis or edema Pulses: 2+ and symmetric Lymph nodes: Cervical adenopathy: no cervical lymphadenopathy Neurologic: Alert and oriented X 3, normal strength and tone. Normal symmetric reflexes.     Labs on Admission:   Recent Labs  04/21/13 1325  NA 139  K 3.4*  CL 107  CO2 23  GLUCOSE 91  BUN 17  CREATININE 0.74  CALCIUM 8.5   No results found for this basename: AST, ALT, ALKPHOS, BILITOT, PROT, ALBUMIN,  in the last 72 hours No results found for this basename: LIPASE, AMYLASE,  in the last 72 hours  Recent Labs  04/21/13 1325  WBC 4.5  NEUTROABS 3.0  HGB 8.5*  HCT 27.7*  MCV 91.7  PLT 132*    Recent Labs  04/21/13 1325  CKTOTAL 97   No results found for this basename: TSH, T4TOTAL, FREET3, T3FREE, THYROIDAB,  in the last 72 hours No results found for this basename: VITAMINB12, FOLATE, FERRITIN, TIBC, IRON, RETICCTPCT,  in the last 72 hours  Radiological Exams on Admission: Ct Head Wo Contrast  04/21/2013  *RADIOLOGY REPORT*  Clinical Data: Fall with head injury and vertigo.  CT HEAD WITHOUT CONTRAST  Technique:  Contiguous axial images were obtained from the base of the skull through the vertex without contrast.  Comparison: None  Findings: A left cerebellar infarct appears subacute - remote.  No acute intracranial abnormalities are identified, including mass lesion or mass effect, hydrocephalus, extra-axial fluid collection, midline shift, hemorrhage,  or acute infarction.  The visualized bony calvarium is unremarkable.  IMPRESSION: No evidence of acute intracranial abnormality.  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   Original Report Authenticated By: Harmon Pier, M.D.    Orders placed in visit on 11/09/11  . EKG 12-LEAD    Assessment/Plan #1 symptomatic anemia likely secondary to occult GI blood loss will transfuse and treat empirically with proton pump inhibitor  #2 multiple falls multifactorial to include osteoarthritis of the knees as well as morbid obesity.  #3 left cerebellar infarct appears to be more remote albeit she does have some minor dizziness may have contributed #2 unclear treatment will be symptomatic at this point  #4 essential hypertension  #5 morbid obesity  #6 osteoarthritis   Naila Elizondo A 04/21/2013, 4:56 PM

## 2013-04-22 ENCOUNTER — Encounter (HOSPITAL_COMMUNITY): Payer: Self-pay | Admitting: *Deleted

## 2013-04-22 ENCOUNTER — Encounter (HOSPITAL_COMMUNITY)
Admission: EM | Disposition: A | Discharge status: Skilled Nursing Facility | Payer: Self-pay | Source: Home / Self Care | Attending: Internal Medicine

## 2013-04-22 DIAGNOSIS — K922 Gastrointestinal hemorrhage, unspecified: Secondary | ICD-10-CM | POA: Diagnosis present

## 2013-04-22 DIAGNOSIS — K254 Chronic or unspecified gastric ulcer with hemorrhage: Secondary | ICD-10-CM

## 2013-04-22 HISTORY — DX: Chronic or unspecified gastric ulcer with hemorrhage: K25.4

## 2013-04-22 HISTORY — PX: ESOPHAGOGASTRODUODENOSCOPY: SHX5428

## 2013-04-22 LAB — CBC
Hemoglobin: 9.4 g/dL — ABNORMAL LOW (ref 12.0–15.0)
MCH: 28.2 pg (ref 26.0–34.0)
Platelets: 94 10*3/uL — ABNORMAL LOW (ref 150–400)
RBC: 3.33 MIL/uL — ABNORMAL LOW (ref 3.87–5.11)
WBC: 2.7 10*3/uL — ABNORMAL LOW (ref 4.0–10.5)

## 2013-04-22 LAB — COMPREHENSIVE METABOLIC PANEL
AST: 39 U/L — ABNORMAL HIGH (ref 0–37)
Albumin: 2.4 g/dL — ABNORMAL LOW (ref 3.5–5.2)
BUN: 16 mg/dL (ref 6–23)
CO2: 25 mEq/L (ref 19–32)
Calcium: 8.5 mg/dL (ref 8.4–10.5)
Creatinine, Ser: 0.71 mg/dL (ref 0.50–1.10)
GFR calc non Af Amer: 88 mL/min — ABNORMAL LOW (ref >90)

## 2013-04-22 SURGERY — EGD (ESOPHAGOGASTRODUODENOSCOPY)
Anesthesia: Moderate Sedation

## 2013-04-22 MED ORDER — SODIUM CHLORIDE 0.9 % IV SOLN
INTRAVENOUS | Status: DC
  Administered 2013-04-22: 500 mL via INTRAVENOUS

## 2013-04-22 MED ORDER — PANTOPRAZOLE SODIUM 40 MG IV SOLR
40.0000 mg | Freq: Once | INTRAVENOUS | Status: AC
  Administered 2013-04-22: 40 mg via INTRAVENOUS
  Filled 2013-04-22: qty 40

## 2013-04-22 MED ORDER — MIDAZOLAM HCL 5 MG/ML IJ SOLN
INTRAMUSCULAR | Status: AC
  Filled 2013-04-22: qty 2

## 2013-04-22 MED ORDER — FENTANYL CITRATE 0.05 MG/ML IJ SOLN
INTRAMUSCULAR | Status: AC
  Filled 2013-04-22: qty 2

## 2013-04-22 MED ORDER — MIDAZOLAM HCL 10 MG/2ML IJ SOLN
INTRAMUSCULAR | Status: DC | PRN
  Administered 2013-04-22: 2 mg via INTRAVENOUS
  Administered 2013-04-22: 1 mg via INTRAVENOUS

## 2013-04-22 MED ORDER — NYSTATIN 100000 UNIT/ML MT SUSP
5.0000 mL | Freq: Four times a day (QID) | OROMUCOSAL | Status: DC
  Administered 2013-04-22 – 2013-04-25 (×10): 500000 [IU] via ORAL
  Filled 2013-04-22 (×14): qty 5

## 2013-04-22 MED ORDER — SODIUM CHLORIDE 0.9 % IV SOLN
8.0000 mg/h | INTRAVENOUS | Status: DC
  Administered 2013-04-22 – 2013-04-23 (×2): 8 mg/h via INTRAVENOUS
  Filled 2013-04-22 (×5): qty 80

## 2013-04-22 MED ORDER — FENTANYL CITRATE 0.05 MG/ML IJ SOLN
INTRAMUSCULAR | Status: DC | PRN
  Administered 2013-04-22: 25 ug via INTRAVENOUS

## 2013-04-22 MED ORDER — BUTAMBEN-TETRACAINE-BENZOCAINE 2-2-14 % EX AERO
INHALATION_SPRAY | CUTANEOUS | Status: DC | PRN
  Administered 2013-04-22: 2 via TOPICAL

## 2013-04-22 NOTE — Evaluation (Signed)
Occupational Therapy Evaluation Patient Details Name: [name] MRN: [id number] DOB: 06-08-1946 Today's Date: 04/22/2013 Time: 1191-4782 OT Time Calculation (min): 24 min  OT Assessment / Plan / Recommendation Clinical Impression  Pt is a 67 yr old female admitted after multiple falls and history of bilateral knee arthritis and now injured right shoulder pain.  Overall presents with min assist for functional transfers and mod to max assist for simulated selfcare tasks.  Feel pt will benefit from acute care OT to help increase overall independence.  Since pt lives alone, feel she will need SNF for follow-up.      OT Assessment  Patient needs continued OT Services    Follow Up Recommendations  SNF    Barriers to Discharge Decreased caregiver support    Equipment Recommendations  3 in 1 bedside comode;Other (comment) (wide 3:1 after SNF)       Frequency  Min 2X/week    Precautions / Restrictions Precautions Precautions: Fall   Pertinent Vitals/Pain Vitals stable    ADL  Eating/Feeding: Simulated;Independent Where Assessed - Eating/Feeding: Edge of bed Grooming: Simulated;Supervision/safety Where Assessed - Grooming: Unsupported sitting Upper Body Bathing: Simulated;Minimal assistance Where Assessed - Upper Body Bathing: Unsupported sitting Lower Body Bathing: Simulated;Moderate assistance Where Assessed - Lower Body Bathing: Supported sit to stand Upper Body Dressing: Simulated;Moderate assistance Where Assessed - Upper Body Dressing: Unsupported sitting Lower Body Dressing: Simulated;Maximal assistance Where Assessed - Lower Body Dressing: Supported sit to stand Toilet Transfer: Minimal assistance;Simulated Statistician Method: Sit to Barista: Other (comment) (step up to the EOB) Toileting - Clothing Manipulation and Hygiene: Performed;+1 Total assistance Where Assessed - Glass blower/designer Manipulation and Hygiene: Other (comment) (sit  to stand from EOB) Tub/Shower Transfer Method: Not assessed Equipment Used: Rolling walker Transfers/Ambulation Related to ADLs: Pt able to stand with min assist using RW for support.  Took 2 small steps up the EOB with min assist. ADL Comments: Pt with history of falls and right arm in sling.  MRI performed on outpatient basis but results no stated yet.  Pt very fearful of falling secondary to having 4 falls in the last week and damaging her right arm.  Will need SNF for rehab secondary to not having any assistance at home.      OT Diagnosis: Generalized weakness;Acute pain  OT Problem List: Decreased strength;Decreased range of motion;Decreased activity tolerance;Impaired balance (sitting and/or standing);Impaired UE functional use;Decreased knowledge of use of DME or AE;Pain;Obesity OT Treatment Interventions: Self-care/ADL training;DME and/or AE instruction;Therapeutic activities;Patient/family education;Balance training;Therapeutic exercise;Manual therapy   OT Goals Acute Rehab OT Goals OT Goal Formulation: With patient/family Time For Goal Achievement: 05/06/13 Potential to Achieve Goals: Good ADL Goals Pt Will Perform Grooming: with supervision;Standing at sink;Supported ADL Goal: Grooming - Progress: Goal set today Pt Will Perform Lower Body Bathing: with supervision;Sit to stand from bed;with adaptive equipment;Supported ADL Goal: Lower Body Bathing - Progress: Goal set today Pt Will Perform Lower Body Dressing: with supervision;Sit to stand from bed ADL Goal: Lower Body Dressing - Progress: Goal set today Pt Will Transfer to Toilet: with supervision;with DME;Extra wide 3-in-1;Ambulation ADL Goal: Toilet Transfer - Progress: Goal set today Pt Will Perform Toileting - Hygiene: with supervision;with adaptive equipment;Sit to stand from 3-in-1/toilet ADL Goal: Toileting - Hygiene - Progress: Goal set today  Visit Information  Last OT Received On: 04/22/13 Assistance Needed:  +1 PT/OT Co-Evaluation/Treatment: Yes    Subjective Data  Subjective: I'm afraid of falling. Patient Stated Goal: Did not state but does  generally want to get stronger.   Prior Functioning     Home Living Lives With: Alone Available Help at Discharge: Skilled Nursing Facility Bathroom Shower/Tub: Tub/shower unit Bathroom Toilet: Standard Home Adaptive Equipment: Dan Humphreys - four wheeled;Tub transfer bench Prior Function Level of Independence: Independent with assistive device(s) Able to Take Stairs?: No Communication Communication: No difficulties Dominant Hand: Right         Vision/Perception Vision - History Baseline Vision: No visual deficits Patient Visual Report: No change from baseline Vision - Assessment Eye Alignment: Within Functional Limits Vision Assessment: Vision not tested Perception Perception: Within Functional Limits Praxis Praxis: Intact   Cognition  Cognition Arousal/Alertness: Awake/alert Behavior During Therapy: WFL for tasks assessed/performed Overall Cognitive Status: Within Functional Limits for tasks assessed    Extremity/Trunk Assessment Right Upper Extremity Assessment RUE ROM/Strength/Tone: Deficits RUE ROM/Strength/Tone Deficits: Pt with right arm in sling.  Able to demonstrate only 0-50 degrees AROM shoulder scaption, 0-150 with AAROM, elbow flexion AROM 0-90 degrees, AAROM 0-150 degrees.  Pt with shoulder pain with AROM and anterior arm pain with elbow flexion.   RUE Sensation: WFL - Light Touch RUE Coordination: WFL - fine motor;Deficits RUE Coordination Deficits: decreased gross motor coordination secondary to shoulder/ arm iinjury Left Upper Extremity Assessment LUE ROM/Strength/Tone: Within functional levels LUE Sensation: WFL - Light Touch LUE Coordination: WFL - gross/fine motor Right Lower Extremity Assessment RLE ROM/Strength/Tone: Deficits RLE ROM/Strength/Tone Deficits: grossly 4/5 Left Lower Extremity Assessment LLE  ROM/Strength/Tone: Deficits LLE ROM/Strength/Tone Deficits: grossly 4/5 Trunk Assessment Trunk Assessment: Normal     Mobility Bed Mobility Bed Mobility: Supine to Sit;Sitting - Scoot to [place] of Bed;Sit to Supine Supine to Sit: 4: Min assist;HOB elevated Sitting - Scoot to [place] of Bed: 4: Min assist Sit to Supine: 4: Min assist;HOB elevated Transfers Transfers: Sit to Stand Sit to Stand: 4: Min assist;With upper extremity assist;From bed Stand to Sit: 4: Min assist;With upper extremity assist;To bed        Balance Balance Balance Assessed: Yes Static Standing Balance Static Standing - Balance Support: Bilateral upper extremity supported Static Standing - Level of Assistance: 4: Min assist Static Standing - Comment/# of Minutes: Pt stood 3 mins while peri care performed   End of Session OT - End of Session Activity Tolerance: Patient tolerated treatment well Patient left: in bed;with call bell/phone within reach;with family/visitor present Nurse Communication: Mobility status  GO Functional Assessment Tool Used: clincial judgement Functional Limitation: Self care Self Care Current Status (W0981): At least 60 percent but less than 80 percent impaired, limited or restricted Self Care Goal Status (X9147): At least 1 percent but less than 20 percent impaired, limited or restricted   Meryl Ponder OTR/L Pager number [phone] 04/22/2013, 12:33 PM

## 2013-04-22 NOTE — Progress Notes (Addendum)
INITIAL NUTRITION ASSESSMENT  DOCUMENTATION CODES Per approved criteria  -Morbid Obesity   INTERVENTION: No nutrition interventions at this time ---> patient declined RD to follow for nutrition care plan  NUTRITION DIAGNOSIS: No nutrition diagnosis at this time  Goal: Oral intake with meals to meet >/= 90% of estimated nutrition needs  Monitor:  PO intake, weight, labs, I/O's  Reason for Assessment: Consult ---> assessment of nutrition requirements/status  67 y.o. female  Admitting Dx: GI bleed, heme positive stool, anemia   ASSESSMENT: Patient presented with multiple falls and weakness; hemoglobin was found to be markedly depressed and heme-positive stools ---> admitted for hydration, transfusion of packed red blood cells; GI consulted.  Patient s/p EGD; impression ---> distal Candida esophagitis, small gastric ulcers with bleeding and antral gastritis; reports she was eating well PTA; advanced to Heart Healthy diet this afternoon; declining supplements at this time.  Height: Ht Readings from Last 1 Encounters:  05/17/12 5\' 6"  (1.676 m)    Weight: Wt Readings from Last 1 Encounters:  05/17/12 270 lb (122.471 kg)    Ideal Body Weight: 130 lb  % Ideal Body Weight: 207%  Wt Readings from Last 10 Encounters:  05/17/12 270 lb (122.471 kg)  11/09/11 256 lb (116.121 kg)    Usual Body Weight: 256 lb  % Usual Body Weight: 105%  BMI:  43.8 kg/m2  Estimated Nutritional Needs: Kcal: 1600-1800 Protein: 90-100 gm Fluid: 1.6-1.8 L  Skin: Intact  Diet Order: NPO  EDUCATION NEEDS: -No education needs identified at this time   Intake/Output Summary (Last 24 hours) at 04/22/13 1431 Last data filed at 04/22/13 1400  Gross per 24 hour  Intake 972.08 ml  Output      0 ml  Net 972.08 ml    Last BM: 5/10  Labs:   Recent Labs Lab 04/21/13 1325 04/22/13 0650  NA 139 142  K 3.4* 3.7  CL 107 110  CO2 23 25  BUN 17 16  CREATININE 0.74 0.71  CALCIUM 8.5  8.5  GLUCOSE 91 90    Scheduled Meds: . [facility] HOLD] diltiazem  60 mg Oral BID  . [facility] HOLD] doxepin  75 mg Oral QHS  . [[facility] HOLD] FLUoxetine  40 mg Oral BID  . [MAR HOLD] LORazepam  1 mg Oral BID  . [facility] HOLD] metoCLOPramide  10 mg Oral Daily  . [MAR HOLD] multivitamin with minerals  1 tablet Oral Daily  . [facility] HOLD] nadolol  20 mg Oral Daily  . [MAR HOLD] pantoprazole (PROTONIX) IV  40 mg Intravenous Once  . [facility] HOLD] sodium chloride  1,000 mL Intravenous Once    Continuous Infusions: . sodium chloride    . sodium chloride 500 mL (04/22/13 1231)  . pantoprozole (PROTONIX) infusion      Past Medical History  Diagnosis Date  . Exogenous obesity   . Hypertension   . Anxiety and depression     chronic  . Hiatal hernia   . Osteoarthritis   . Atypical chest pain 06/10/03    normal 2 day adenosine cardiolite  . Memory changes   . Bipolar disorder     Past Surgical History  Procedure Laterality Date  . Tubal ligation  1977  . Other surgical history  1980    hysterectomy  . Cardiac catheterization  1990    normal cath    Maureen Chatters, RD, LDN Pager #: [phone] After-Hours Pager #: [phone]

## 2013-04-22 NOTE — Progress Notes (Signed)
Subjective: Admitted c Sxatic Anemia, Falls, Prolonged time on the floor, Could not get up, Heme + stools. S/p 2 units of PRBCs. R Shoulder in a sling 4 Falls in last week.  Objective: Vital signs in last 24 hours: Temp:  [97.8 F (36.6 C)-98.7 F (37.1 C)] 98.4 F (36.9 C) (05/12 0515) Pulse Rate:  [71-86] 74 (05/12 0515) Resp:  [16-20] 16 (05/12 0515) BP: (100-134)/(38-68) 120/54 mmHg (05/12 0515) SpO2:  [97 %-99 %] 97 % (05/12 0325) Weight change:  Last BM Date: 04/20/13  CBG (last 3)  No results found for this basename: GLUCAP,  in the last 72 hours  Intake/Output from previous day:  Intake/Output Summary (Last 24 hours) at 04/22/13 0830 Last data filed at 04/22/13 0515  Gross per 24 hour  Intake 722.08 ml  Output      0 ml  Net 722.08 ml   05/11 0701 - 05/12 0700 In: 722.1 [Blood:722.1] Out: -    Physical Exam General appearance: alert, cooperative and no distress  Head: Normocephalic, without obvious abnormality, atraumatic  Eyes: conjunctivae/corneas clear. PERRL, EOM's intact.  Nose: Nares normal. Septum midline. Mucosa normal. No drainage or sinus tenderness.  Throat: lips, mucosa, and tongue normal; teeth and gums normal  Neck: no adenopathy, no carotid bruit, no JVD and thyroid not enlarged, symmetric, no tenderness/mass/nodules  Resp: clear to auscultation bilaterally  Cardio: regular rate and rhythm, S1, S2 normal, no murmur, click, rub or gallop  GI: soft, non-tender; bowel sounds normal; no masses, no organomegaly, morbidly obese  Extremities: R shoulder in a sling. Pulses: 2+ and symmetric      Lab Results:  Recent Labs  04/21/13 1325  NA 139  K 3.4*  CL 107  CO2 23  GLUCOSE 91  BUN 17  CREATININE 0.74  CALCIUM 8.5    No results found for this basename: AST, ALT, ALKPHOS, BILITOT, PROT, ALBUMIN,  in the last 72 hours   Recent Labs  04/21/13 1325 04/22/13 0650  WBC 4.5 2.7*  NEUTROABS 3.0  --   HGB 8.5* 9.4*  HCT 27.7* 29.6*   MCV 91.7 88.9  PLT 132* PENDING    No results found for this basename: INR,  PROTIME     Recent Labs  04/21/13 1325  CKTOTAL 97    No results found for this basename: TSH, T4TOTAL, FREET3, T3FREE, THYROIDAB,  in the last 72 hours  No results found for this basename: VITAMINB12, FOLATE, FERRITIN, TIBC, IRON, RETICCTPCT,  in the last 72 hours  Micro Results: No results found for this or any previous visit (from the past 240 hour(s)).   Studies/Results: Ct Head Wo Contrast  04/21/2013  *RADIOLOGY REPORT*  Clinical Data: Fall with head injury and vertigo.  CT HEAD WITHOUT CONTRAST  Technique:  Contiguous axial images were obtained from the base of the skull through the vertex without contrast.  Comparison: None  Findings: A left cerebellar infarct appears subacute - remote.  No acute intracranial abnormalities are identified, including mass lesion or mass effect, hydrocephalus, extra-axial fluid collection, midline shift, hemorrhage, or acute infarction.  The visualized bony calvarium is unremarkable.  IMPRESSION: No evidence of acute intracranial abnormality.  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   Original Report Authenticated By: Harmon Pier, M.D.      Medications: Scheduled: . diltiazem  60 mg Oral BID  . doxepin  75 mg Oral QHS  . FLUoxetine  40 mg Oral BID  . LORazepam  1 mg Oral BID  .  metoCLOPramide  10 mg Oral Daily  . multivitamin with minerals  1 tablet Oral Daily  . nadolol  20 mg Oral Daily  . pantoprazole  40 mg Oral BID AC  . sodium chloride  1,000 mL Intravenous Once   Continuous: . sodium chloride       Assessment/Plan: Active Problems:   Anemia   Falls frequently  Sxatic Anemia - She is s/p 2 U PRBCs.  This ams labs show Hbg up 1 point with the 2 units. Hbg 11.7 on 03/14/13. Last EGD 03/05/10 and Colon 08/01/11 per Dr Madilyn Fireman - will consult them.  May need EGD?? Heme + Stools. - Protonix.  GI. Hold ASA for a few days.  Change Lovenox to  squeezers in case any active bleeding. Multiple falls is multifactorial and due to age/AFTT/osteoarthritis of the knees/morbid obesity/Deconditioning/Sxatic Anemia. As she was on ground for a period of time we are lucky no fracture and No Rhabdo.  Await PT/OT consults AFTT - PT/OT/CW - Will need SNF short term and ALF long term. HTN - BP fine. Obesity - Needs Weight loss. Depression/Anxiety - on meds CVA c Left cerebellar infarct which appears subacute to remote on CCT - Will continue RF reduction. Dizzy - No major issues DVT Prophylaxis.   R Shoulder in a sling - Had MRI Wed May 7th per Dr Janey Greaser and is yet to hear back.  She is in pain and loss of ability to R Shoulder contributed to current fall and inability to get up.  I called [place] [facility] for a consult to see @ what MRI showed and what the Rx plan is.  Will have them eval whether she is wearing correct sling.    LOS: 1 day   Cordale Manera M 04/22/2013, 8:30 AM

## 2013-04-22 NOTE — Evaluation (Signed)
Physical Therapy Evaluation Patient Details Name: [name] MRN: [id number] DOB: 30-Mar-1946 Today's Date: 04/22/2013 Time: 4098-1191 PT Time Calculation (min): 25 min  PT Assessment / Plan / Recommendation Clinical Impression  Pt adm with 4 falls over several days and with anemia. Pt also injured rt shoulder during fall and has had OP ortho appt.  Pt had MRI of shoulder but no results known.  Needs skilled PT to maximize I and safety so pt may eventually return home.      PT Assessment  Patient needs continued PT services    Follow Up Recommendations  SNF    Does the patient have the potential to tolerate intense rehabilitation      Barriers to Discharge Decreased caregiver support      Equipment Recommendations  None recommended by PT    Recommendations for Other Services     Frequency Min 3X/week    Precautions / Restrictions Precautions Precautions: Fall   Pertinent Vitals/Pain See flow sheet      Mobility  Bed Mobility Bed Mobility: Supine to Sit;Sitting - Scoot to Edge of Bed;Sit to Supine Supine to Sit: 4: Min assist;HOB elevated Sitting - Scoot to [place] of Bed: 4: Min assist Sit to Supine: 4: Min assist;HOB elevated Transfers Transfers: Sit to Stand;Stand to Sit Sit to Stand: 4: Min assist;With upper extremity assist;From bed Stand to Sit: 4: Min assist;With upper extremity assist;To bed Ambulation/Gait Ambulation/Gait Assistance: 4: Min assist Ambulation Distance (Feet):  (side step 2 feet) Assistive device: Rolling walker Ambulation/Gait Assistance Details: Side stepped up the side of bed.  Pt fearful of falling and wasn't  comfortable stepping away from bed.    Exercises     PT Diagnosis: Difficulty walking;Generalized weakness  PT Problem List: Decreased strength;Decreased activity tolerance;Decreased balance;Decreased mobility;Decreased knowledge of use of DME;Decreased knowledge of precautions PT Treatment Interventions: DME  instruction;Gait training;Patient/family education;Functional mobility training;Therapeutic activities;Therapeutic exercise;Balance training   PT Goals Acute Rehab PT Goals PT Goal Formulation: With patient Time For Goal Achievement: 04/29/13 Potential to Achieve Goals: Good Pt will go Supine/Side to Sit: with supervision PT Goal: Supine/Side to Sit - Progress: Goal set today Pt will go Sit to Supine/Side: with supervision PT Goal: Sit to Supine/Side - Progress: Goal set today Pt will go Sit to Stand: with supervision PT Goal: Sit to Stand - Progress: Goal set today Pt will go Stand to Sit: with supervision PT Goal: Stand to Sit - Progress: Goal set today Pt will Ambulate: 16 - 50 feet;with min assist PT Goal: Ambulate - Progress: Goal set today  Visit Information  Last PT Received On: 04/22/13 Assistance Needed: +1    Subjective Data  Subjective: Pt states she is scared to get up because she will fall. Patient Stated Goal: Not fall.   Prior Functioning  Home Living Lives With: Alone Available Help at Discharge: Skilled Nursing Facility Bathroom Shower/Tub: Tub/shower unit Bathroom Toilet: Standard Home Adaptive Equipment: Dan Humphreys - four wheeled;Tub transfer bench Prior Function Level of Independence: Independent with assistive device(s) Able to Take Stairs?: No Communication Communication: No difficulties Dominant Hand: Right    Cognition  Cognition Arousal/Alertness: Awake/alert Behavior During Therapy: WFL for tasks assessed/performed Overall Cognitive Status: Within Functional Limits for tasks assessed    Extremity/Trunk Assessment Right Upper Extremity Assessment RUE ROM/Strength/Tone: Deficits RUE ROM/Strength/Tone Deficits: Pt with right arm in sling.  Able to demonstrate only 0-50 degrees AROM shoulder scaption, 0-150 with AAROM, elbow flexion AROM 0-90 degrees, AAROM 0-150 degrees.  Pt with shoulder  pain with AROM and anterior arm pain with elbow flexion.    RUE Sensation: WFL - Light Touch RUE Coordination: WFL - fine motor;Deficits RUE Coordination Deficits: decreased gross motor coordination secondary to shoulder/ arm iinjury Left Upper Extremity Assessment LUE ROM/Strength/Tone: Within functional levels LUE Sensation: WFL - Light Touch LUE Coordination: WFL - gross/fine motor Right Lower Extremity Assessment RLE ROM/Strength/Tone: Deficits RLE ROM/Strength/Tone Deficits: grossly 4/5 Left Lower Extremity Assessment LLE ROM/Strength/Tone: Deficits LLE ROM/Strength/Tone Deficits: grossly 4/5 Trunk Assessment Trunk Assessment: Normal   Balance Balance Balance Assessed: Yes Static Standing Balance Static Standing - Balance Support: Bilateral upper extremity supported Static Standing - Level of Assistance: 4: Min assist Static Standing - Comment/# of Minutes: Pt stood 3 mins while peri care performed  End of Session PT - End of Session Activity Tolerance: Other (comment) (Pt fearful of falling.) Patient left: in bed Nurse Communication: Mobility status  GP Functional Assessment Tool Used: clinical judgement Functional Limitation: Mobility: Walking and moving around Mobility: Walking and Moving Around Current Status (Z6109): At least 20 percent but less than 40 percent impaired, limited or restricted Mobility: Walking and Moving Around Goal Status [phone]): At least 1 percent but less than 20 percent impaired, limited or restricted   [facility] 04/22/2013, 2:01 PM

## 2013-04-22 NOTE — Op Note (Signed)
[facility] [address] [place], [address]   ENDOSCOPY PROCEDURE REPORT  PATIENT: [name], [name]  MR#: [id number] BIRTHDATE: 04/02/1946 , 66  yrs. old GENDER: Female  ENDOSCOPIST: Charlott Rakes, MD REFERRED QI:ONGE Timothy Lasso, M.D.  PROCEDURE DATE:  04/22/2013 PROCEDURE:   EGD, diagnostic ASA CLASS:   Class III INDICATIONS:GI bleed.   Heme positive stool.   Anemia. MEDICATIONS: Fentanyl 25 mcg IV, Versed 3 mg IV, and Cetacaine spray x 2  TOPICAL ANESTHETIC:  DESCRIPTION OF PROCEDURE:   After the risks benefits and alternatives of the procedure were thoroughly explained, informed consent was obtained.  The Pentax Gastroscope [id number]  endoscope was introduced through the mouth and advanced to the second portion of the duodenum , limited by Without limitations.   The instrument was slowly withdrawn as the mucosa was fully examined.     FINDINGS: The endoscope was inserted into the oropharynx and esophagus was intubated.  The gastroesophageal junction was noted to be 38 cm from the incisors. There was a segmental area of whitish-yellow plaques in the distal esophagus. Plaques were circumferential at the GEJ with part of it being friable  Endoscope was advanced into the stomach, which revealed scattered erosions and superficial small ulcerations in the antrum with dark pigmented material seen over two of the ulcerations.   The endoscope was advanced to the duodenal bulb and second portion of duodenum which were unremarkable.  The endoscope was withdrawn back into the stomach and retroflexion revealed a normal proximal stomach except for the plaques previously seen at the GEJ. Esophageal brushing done.  COMPLICATIONS: None  ENDOSCOPIC IMPRESSION:     1. Distal Candida esophagitis - s/p brushings - will start Nystatin swish and swallow 2. Small gastric ulcers with bleeding stigmata - likely source of the melena and anemia 3. Antral  gastritis   RECOMMENDATIONS: PPI infusion today and change to IV PPI Q 12 hours tomorrow; F/U on brushings; Hold off on colonoscopy in light of these EGD findings unless black stools persist despite treatment; Clear liquids and advance as tolerated   REPEAT EXAM: N/A  _______________________________ Charlott Rakes, MD eSigned:  Charlott Rakes, MD 04/22/2013 1:36 PM    XB:MWUX Timothy Lasso, MD  PATIENT NAME:  [name], [name] MR#: [id number]

## 2013-04-22 NOTE — H&P (View-Only) (Signed)
Referring Provider: Dr. Timothy Lasso Primary Care Physician:  No primary provider on file. Primary Gastroenterologist:  Dr. Madilyn Fireman  Reason for Consultation:  GI bleed; Heme positive stool; Anemia  HPI: [name] is a 67 y.o. female admitted for symptomatic anemia who has been falling frequently and has her right arm in a sling. Family reports she fell off her porch last week and injured her right shoulder. BP 100/38 with HR 80 in ER. She denies black stools on admit but during my evaluation with her family (2 daughters and her sister) in the room she admits to having "black tarry stools for 6 to 7 weeks." Denies any hematochezia/N/V/hematemesis. No history of ulcers. EGD in 2011 that showed gastritis, hyperplastic polyp, and a hiatal hernia and a colonoscopy in 2012 that was done due to chronic diarrhea that was negative for microscopic colitis and a hyperplastic polyp. Occasional alcohol but none in a long time per her family. Takes 2 Aleve once a day for a long period of time and denies other NSAIDs. Hgb 8.5 down from 11.3 in April. S/P 2 U PRBCs and Hgb now 9.4. Reports chronic constipation where she will have small pellets of stool when she goes and that has occurred for the past several years. Occasional abdominal cramping and takes Metoclopramide for that. Delayed gastric emptying seen on study in 2011.    Past Medical History  Diagnosis Date  . Exogenous obesity   . Hypertension   . Anxiety and depression     chronic  . Hiatal hernia   . Osteoarthritis   . Atypical chest pain 06/10/03    normal 2 day adenosine cardiolite  . Memory changes   . Bipolar disorder     Past Surgical History  Procedure Laterality Date  . Tubal ligation  1977  . Other surgical history  1980    hysterectomy  . Cardiac catheterization  1990    normal cath    Prior to Admission medications   Medication Sig Start Date End Date Taking? Authorizing Provider  aspirin 81 MG tablet Take 81 mg by mouth  daily. Takes three daily    Yes Historical Provider, MD  diltiazem (CARDIZEM) 60 MG tablet Take 1 tablet (60 mg total) by mouth 2 (two) times daily. 11/14/12  Yes Cassell Clement, MD  doxepin (SINEQUAN) 75 MG capsule Take 75 mg by mouth.     Yes Historical Provider, MD  FLUoxetine (PROZAC) 40 MG capsule Take 40 mg by mouth 2 (two) times daily. Per Dr.Pulis   Yes Historical Provider, MD  LORazepam (ATIVAN) 1 MG tablet Take 1 mg by mouth 2 (two) times daily.   Yes Historical Provider, MD  Melatonin 3 MG CAPS Take 1 capsule by mouth daily.   Yes Historical Provider, MD  metoCLOPramide (REGLAN) 10 MG tablet Take 10 mg by mouth daily.     Yes Historical Provider, MD  Multiple Vitamin (MULTIVITAMIN) tablet Take 1 tablet by mouth daily.   Yes Historical Provider, MD  nadolol (CORGARD) 20 MG tablet Take 20 mg by mouth daily.   Yes Historical Provider, MD    Scheduled Meds: . diltiazem  60 mg Oral BID  . doxepin  75 mg Oral QHS  . FLUoxetine  40 mg Oral BID  . LORazepam  1 mg Oral BID  . metoCLOPramide  10 mg Oral Daily  . multivitamin with minerals  1 tablet Oral Daily  . nadolol  20 mg Oral Daily  . pantoprazole  40 mg Oral BID  AC  . sodium chloride  1,000 mL Intravenous Once   Continuous Infusions: . sodium chloride     PRN Meds:.acetaminophen, acetaminophen, polyethylene glycol, zolpidem  Allergies as of 04/21/2013 - Review Complete 04/21/2013  Allergen Reaction Noted  . Codeine  04/21/2013  . Sulfa antibiotics  11/09/2011    Family History  Problem Relation Age of Onset  . Heart attack Mother   . Heart disease Mother   . Heart disease Father   . Heart attack Father     History   Social History  . Marital Status: Widowed    Spouse Name: N/A    Number of Children: N/A  . Years of Education: N/A   Occupational History  . Not on file.   Social History Main Topics  . Smoking status: Never Smoker   . Smokeless tobacco: Not on file  . Alcohol Use: Not on file  . Drug Use:  Not on file  . Sexually Active: Not on file   Other Topics Concern  . Not on file   Social History Narrative  . No narrative on file    Review of Systems: All negative except as stated above in HPI.  Physical Exam: Vital signs: Filed Vitals:   04/22/13 0515  BP: 120/54  Pulse: 74  Temp: 98.4 F (36.9 C)  Resp: 16   Last BM Date: 04/20/13 General:   Alert,  Well-developed, well-nourished, pleasant and cooperative in NAD, morbidly obese HEENT: anicteric Lungs:  Clear throughout to auscultation.   No wheezes, crackles, or rhonchi. No acute distress. Heart:  Regular rate and rhythm; no murmurs, clicks, rubs,  or gallops. Abdomen: soft, nontender, nondistended, +BS, morbidly obese, scattered ecchymosis on abdominal wall  Rectal:  Deferred Ext: no edema  GI:  Lab Results:  Recent Labs  04/21/13 1325 04/22/13 0650  WBC 4.5 2.7*  HGB 8.5* 9.4*  HCT 27.7* 29.6*  PLT 132* 94*   BMET  Recent Labs  04/21/13 1325 04/22/13 0650  NA 139 142  K 3.4* 3.7  CL 107 110  CO2 23 25  GLUCOSE 91 90  BUN 17 16  CREATININE 0.74 0.71  CALCIUM 8.5 8.5   LFT  Recent Labs  04/22/13 0650  PROT 5.3*  ALBUMIN 2.4*  AST 39*  ALT 20  ALKPHOS 72  BILITOT 3.0*   PT/INR No results found for this basename: LABPROT, INR,  in the last 72 hours   Studies/Results: Ct Head Wo Contrast  04/21/2013  *RADIOLOGY REPORT*  Clinical Data: Fall with head injury and vertigo.  CT HEAD WITHOUT CONTRAST  Technique:  Contiguous axial images were obtained from the base of the skull through the vertex without contrast.  Comparison: None  Findings: A left cerebellar infarct appears subacute - remote.  No acute intracranial abnormalities are identified, including mass lesion or mass effect, hydrocephalus, extra-axial fluid collection, midline shift, hemorrhage, or acute infarction.  The visualized bony calvarium is unremarkable.  IMPRESSION: No evidence of acute intracranial abnormality.  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   Original Report Authenticated By: Harmon Pier, M.D.     Impression/Plan: 67 yo with melena X 7 weeks and resulting symptomatic anemia concerning for a peptic ulcer bleed. EGD today. Will start Protonix infusion. NPO. If EGD unrevealing then may need an updated colonoscopy but will need a 2 day prep for that due to her chronic constipation.    LOS: 1 day   Jovanka Westgate C.  04/22/2013, 11:23 AM

## 2013-04-22 NOTE — Interval H&P Note (Signed)
History and Physical Interval Note:  04/22/2013 1:05 PM  [name]  has presented today for surgery, with the diagnosis of anemia/hem.positive stool  The various methods of treatment have been discussed with the patient and family. After consideration of risks, benefits and other options for treatment, the patient has consented to  Procedure(s): ESOPHAGOGASTRODUODENOSCOPY (EGD) (N/A) as a surgical intervention .  The patient's history has been reviewed, patient examined, no change in status, stable for surgery.  I have reviewed the patient's chart and labs.  Questions were answered to the patient's satisfaction.     Deyci Gesell C.

## 2013-04-22 NOTE — Consult Note (Addendum)
Referring Provider: Dr. Russo Primary Care Physician:  No primary provider on file. Primary Gastroenterologist:  Dr. Hayes  Reason for Consultation:  GI bleed; Heme positive stool; Anemia  HPI: Eun D Lycan is a 66 y.o. female admitted for symptomatic anemia who has been falling frequently and has her right arm in a sling. Family reports she fell off her porch last week and injured her right shoulder. BP 100/38 with HR 80 in ER. She denies black stools on admit but during my evaluation with her family (2 daughters and her sister) in the room she admits to having "black tarry stools for 6 to 7 weeks." Denies any hematochezia/N/V/hematemesis. No history of ulcers. EGD in 2011 that showed gastritis, hyperplastic polyp, and a hiatal hernia and a colonoscopy in 2012 that was done due to chronic diarrhea that was negative for microscopic colitis and a hyperplastic polyp. Occasional alcohol but none in a long time per her family. Takes 2 Aleve once a day for a long period of time and denies other NSAIDs. Hgb 8.5 down from 11.3 in April. S/P 2 U PRBCs and Hgb now 9.4. Reports chronic constipation where she will have small pellets of stool when she goes and that has occurred for the past several years. Occasional abdominal cramping and takes Metoclopramide for that. Delayed gastric emptying seen on study in 2011.    Past Medical History  Diagnosis Date  . Exogenous obesity   . Hypertension   . Anxiety and depression     chronic  . Hiatal hernia   . Osteoarthritis   . Atypical chest pain 06/10/03    normal 2 day adenosine cardiolite  . Memory changes   . Bipolar disorder     Past Surgical History  Procedure Laterality Date  . Tubal ligation  1977  . Other surgical history  1980    hysterectomy  . Cardiac catheterization  1990    normal cath    Prior to Admission medications   Medication Sig Start Date End Date Taking? Authorizing Provider  aspirin 81 MG tablet Take 81 mg by mouth  daily. Takes three daily    Yes Historical Provider, MD  diltiazem (CARDIZEM) 60 MG tablet Take 1 tablet (60 mg total) by mouth 2 (two) times daily. 11/14/12  Yes Thomas Brackbill, MD  doxepin (SINEQUAN) 75 MG capsule Take 75 mg by mouth.     Yes Historical Provider, MD  FLUoxetine (PROZAC) 40 MG capsule Take 40 mg by mouth 2 (two) times daily. Per Dr.Pulis   Yes Historical Provider, MD  LORazepam (ATIVAN) 1 MG tablet Take 1 mg by mouth 2 (two) times daily.   Yes Historical Provider, MD  Melatonin 3 MG CAPS Take 1 capsule by mouth daily.   Yes Historical Provider, MD  metoCLOPramide (REGLAN) 10 MG tablet Take 10 mg by mouth daily.     Yes Historical Provider, MD  Multiple Vitamin (MULTIVITAMIN) tablet Take 1 tablet by mouth daily.   Yes Historical Provider, MD  nadolol (CORGARD) 20 MG tablet Take 20 mg by mouth daily.   Yes Historical Provider, MD    Scheduled Meds: . diltiazem  60 mg Oral BID  . doxepin  75 mg Oral QHS  . FLUoxetine  40 mg Oral BID  . LORazepam  1 mg Oral BID  . metoCLOPramide  10 mg Oral Daily  . multivitamin with minerals  1 tablet Oral Daily  . nadolol  20 mg Oral Daily  . pantoprazole  40 mg Oral BID   AC  . sodium chloride  1,000 mL Intravenous Once   Continuous Infusions: . sodium chloride     PRN Meds:.acetaminophen, acetaminophen, polyethylene glycol, zolpidem  Allergies as of 04/21/2013 - Review Complete 04/21/2013  Allergen Reaction Noted  . Codeine  04/21/2013  . Sulfa antibiotics  11/09/2011    Family History  Problem Relation Age of Onset  . Heart attack Mother   . Heart disease Mother   . Heart disease Father   . Heart attack Father     History   Social History  . Marital Status: Widowed    Spouse Name: N/A    Number of Children: N/A  . Years of Education: N/A   Occupational History  . Not on file.   Social History Main Topics  . Smoking status: Never Smoker   . Smokeless tobacco: Not on file  . Alcohol Use: Not on file  . Drug Use:  Not on file  . Sexually Active: Not on file   Other Topics Concern  . Not on file   Social History Narrative  . No narrative on file    Review of Systems: All negative except as stated above in HPI.  Physical Exam: Vital signs: Filed Vitals:   04/22/13 0515  BP: 120/54  Pulse: 74  Temp: 98.4 F (36.9 C)  Resp: 16   Last BM Date: 04/20/13 General:   Alert,  Well-developed, well-nourished, pleasant and cooperative in NAD, morbidly obese HEENT: anicteric Lungs:  Clear throughout to auscultation.   No wheezes, crackles, or rhonchi. No acute distress. Heart:  Regular rate and rhythm; no murmurs, clicks, rubs,  or gallops. Abdomen: soft, nontender, nondistended, +BS, morbidly obese, scattered ecchymosis on abdominal wall  Rectal:  Deferred Ext: no edema  GI:  Lab Results:  Recent Labs  04/21/13 1325 04/22/13 0650  WBC 4.5 2.7*  HGB 8.5* 9.4*  HCT 27.7* 29.6*  PLT 132* 94*   BMET  Recent Labs  04/21/13 1325 04/22/13 0650  NA 139 142  K 3.4* 3.7  CL 107 110  CO2 23 25  GLUCOSE 91 90  BUN 17 16  CREATININE 0.74 0.71  CALCIUM 8.5 8.5   LFT  Recent Labs  04/22/13 0650  PROT 5.3*  ALBUMIN 2.4*  AST 39*  ALT 20  ALKPHOS 72  BILITOT 3.0*   PT/INR No results found for this basename: LABPROT, INR,  in the last 72 hours   Studies/Results: Ct Head Wo Contrast  04/21/2013  *RADIOLOGY REPORT*  Clinical Data: Fall with head injury and vertigo.  CT HEAD WITHOUT CONTRAST  Technique:  Contiguous axial images were obtained from the base of the skull through the vertex without contrast.  Comparison: None  Findings: A left cerebellar infarct appears subacute - remote.  No acute intracranial abnormalities are identified, including mass lesion or mass effect, hydrocephalus, extra-axial fluid collection, midline shift, hemorrhage, or acute infarction.  The visualized bony calvarium is unremarkable.  IMPRESSION: No evidence of acute intracranial abnormality.  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   Original Report Authenticated By: Jeffrey Hu, M.D.     Impression/Plan: 66 yo with melena X 7 weeks and resulting symptomatic anemia concerning for a peptic ulcer bleed. EGD today. Will start Protonix infusion. NPO. If EGD unrevealing then may need an updated colonoscopy but will need a 2 day prep for that due to her chronic constipation.    LOS: 1 day   Sejla Marzano C.  04/22/2013, 11:23 AM   

## 2013-04-22 NOTE — Brief Op Note (Addendum)
See endopro note for details. Antral gastritis and small gastric ulcers; Candida esophagitis. See recs in EGD report. Patient reported 3-4 weeks of intermittent solid food dysphagia and no trouble with liquids prior to starting the procedure and her Candida esophagitis is the likely source of this dysphagia.

## 2013-04-23 ENCOUNTER — Encounter (HOSPITAL_COMMUNITY): Payer: Self-pay | Admitting: Gastroenterology

## 2013-04-23 LAB — CBC
HCT: 30.7 % — ABNORMAL LOW (ref 36.0–46.0)
Hemoglobin: 9.6 g/dL — ABNORMAL LOW (ref 12.0–15.0)
MCHC: 31.3 g/dL (ref 30.0–36.0)

## 2013-04-23 LAB — BASIC METABOLIC PANEL
BUN: 16 mg/dL (ref 6–23)
GFR calc non Af Amer: 74 mL/min — ABNORMAL LOW (ref >90)
Glucose, Bld: 87 mg/dL (ref 70–99)
Potassium: 3.9 mEq/L (ref 3.5–5.1)

## 2013-04-23 LAB — H. PYLORI ANTIBODY, IGG: H Pylori IgG: <0.4 {ISR}

## 2013-04-23 LAB — TYPE AND SCREEN
ABO/RH(D): A POS
Antibody Screen: NEGATIVE
Unit division: 0

## 2013-04-23 MED ORDER — ONDANSETRON HCL 4 MG/2ML IJ SOLN
4.0000 mg | Freq: Four times a day (QID) | INTRAMUSCULAR | Status: DC | PRN
  Administered 2013-04-24: 4 mg via INTRAVENOUS
  Filled 2013-04-23: qty 2

## 2013-04-23 MED ORDER — ONDANSETRON HCL 4 MG PO TABS
4.0000 mg | ORAL_TABLET | Freq: Four times a day (QID) | ORAL | Status: DC | PRN
  Administered 2013-04-23 – 2013-04-25 (×2): 4 mg via ORAL
  Filled 2013-04-23 (×2): qty 1

## 2013-04-23 MED ORDER — PANTOPRAZOLE SODIUM 40 MG PO TBEC
40.0000 mg | DELAYED_RELEASE_TABLET | Freq: Two times a day (BID) | ORAL | Status: DC
  Administered 2013-04-23 – 2013-04-25 (×5): 40 mg via ORAL
  Filled 2013-04-23 (×5): qty 1

## 2013-04-23 NOTE — Progress Notes (Signed)
Subjective: Tolerating diet. No bleeding.  Objective: Vital signs in last 24 hours: Temp:  [98.2 F (36.8 C)-99.2 F (37.3 C)] 98.2 F (36.8 C) (05/13 0541) Pulse Rate:  [72-75] 73 (05/13 0541) Resp:  [18-27] 20 (05/13 0541) BP: (102-127)/(54-88) 105/54 mmHg (05/13 0541) SpO2:  [91 %-96 %] 94 % (05/13 0541) Weight:  [127.007 kg (280 lb)] 127.007 kg (280 lb) (05/12 1500) Weight change:  Last BM Date: 04/20/13  PE: GEN:  Overweight, right shoulder in sling, non-toxic appearing ABD:  Protuberant, soft  Lab Results: CBC    Component Value Date/Time   WBC 3.2* 04/23/2013 0505   RBC 3.43* 04/23/2013 0505   HGB 9.6* 04/23/2013 0505   HCT 30.7* 04/23/2013 0505   PLT 89* 04/23/2013 0505   MCV 89.5 04/23/2013 0505   MCH 28.0 04/23/2013 0505   MCHC 31.3 04/23/2013 0505   RDW 16.7* 04/23/2013 0505   LYMPHSABS 0.9 04/21/2013 1325   MONOABS 0.4 04/21/2013 1325   EOSABS 0.1 04/21/2013 1325   BASOSABS 0.0 04/21/2013 1325   Assessment:  1.  Anemia with melena.  Likely ulcer-related. 2.  Presumed candida esophagitis, brushings pending.  Plan:  1. Advance diet, as you are doing. 2. Change pantoprazole to 40 mg po bid, and continue bid dosing for 8 weeks, then transition to once-a-day dosing indefinitely thereafter. 3.  10-day course of nystatin for presumed candida esophagitis. 4.  Will sign-off; patient should call [phone] to schedule follow-up with Dr. Madilyn Fireman, her primary gastroenterologist.   Freddy Jaksch 04/23/2013, 10:06 AM

## 2013-04-23 NOTE — Progress Notes (Signed)
Subjective: Admitted c Sxatic Anemia S/p 2 units of PRBCs and GI consult Dr Bosie Clos did EGD on her yesterday and found Distal Candida esophagitis - s/p brushings - started on Nystatin swish and swallow; Small gastric ulcers with bleeding stigmata - likely source of the melena and anemia; Antral gastritis. Did PPI infusion yesterday and changed to IV PPI Q 12 hours today.  As far as Falls, AFTT, Generalized weakness and R Shoulder in a sling we are awaiting PT/OT/CW and Ortho evals.  She will need SNF on D/C  No new c/o  Objective: Vital signs in last 24 hours: Temp:  [98.2 F (36.8 C)-99.2 F (37.3 C)] 98.2 F (36.8 C) (05/13 0541) Pulse Rate:  [72-75] 73 (05/13 0541) Resp:  [18-27] 20 (05/13 0541) BP: (102-127)/(54-88) 105/54 mmHg (05/13 0541) SpO2:  [91 %-96 %] 94 % (05/13 0541) Weight:  [127.007 kg (280 lb)] 127.007 kg (280 lb) (05/12 1500) Weight change:  Last BM Date: 04/20/13  CBG (last 3)   Recent Labs  04/22/13 2135  GLUCAP 98    Intake/Output from previous day:  Intake/Output Summary (Last 24 hours) at 04/23/13 0710 Last data filed at 04/23/13 0500  Gross per 24 hour  Intake    820 ml  Output      0 ml  Net    820 ml   05/12 0701 - 05/13 0700 In: 820 [P.O.:100; I.V.:720] Out: -    Physical Exam General appearance: alert, cooperative and no distress. Snores. Head: Normocephalic, without obvious abnormality, atraumatic  Eyes: conjunctivae/corneas clear. PERRL, EOM's intact.  Nose: Nares normal. Septum midline. Mucosa normal. No drainage or sinus tenderness.  Throat: lips, mucosa, and tongue normal; teeth and gums normal  Neck: no adenopathy, no carotid bruit, no JVD and thyroid not enlarged, symmetric, no tenderness/mass/nodules  Resp: clear to auscultation bilaterally  Cardio: regular rate and rhythm, S1, S2 normal, no murmur, click, rub or gallop  GI: soft, non-tender; bowel sounds normal; no masses, no organomegaly, morbidly obese  Extremities: R  shoulder in a sling. Pulses: 2+ and symmetric      Lab Results:  Recent Labs  04/22/13 0650 04/23/13 0505  NA 142 141  K 3.7 3.9  CL 110 109  CO2 25 26  GLUCOSE 90 87  BUN 16 16  CREATININE 0.71 0.81  CALCIUM 8.5 8.4     Recent Labs  04/22/13 0650  AST 39*  ALT 20  ALKPHOS 72  BILITOT 3.0*  PROT 5.3*  ALBUMIN 2.4*     Recent Labs  04/21/13 1325 04/22/13 0650 04/23/13 0505  WBC 4.5 2.7* 3.2*  NEUTROABS 3.0  --   --   HGB 8.5* 9.4* 9.6*  HCT 27.7* 29.6* 30.7*  MCV 91.7 88.9 89.5  PLT 132* 94* 89*    No results found for this basename: INR,  PROTIME     Recent Labs  04/21/13 1325  CKTOTAL 97    No results found for this basename: TSH, T4TOTAL, FREET3, T3FREE, THYROIDAB,  in the last 72 hours  No results found for this basename: VITAMINB12, FOLATE, FERRITIN, TIBC, IRON, RETICCTPCT,  in the last 72 hours  Micro Results: No results found for this or any previous visit (from the past 240 hour(s)).   Studies/Results: Ct Head Wo Contrast  04/21/2013  *RADIOLOGY REPORT*  Clinical Data: Fall with head injury and vertigo.  CT HEAD WITHOUT CONTRAST  Technique:  Contiguous axial images were obtained from the base of the skull through the vertex without  contrast.  Comparison: None  Findings: A left cerebellar infarct appears subacute - remote.  No acute intracranial abnormalities are identified, including mass lesion or mass effect, hydrocephalus, extra-axial fluid collection, midline shift, hemorrhage, or acute infarction.  The visualized bony calvarium is unremarkable.  IMPRESSION: No evidence of acute intracranial abnormality.  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   Original Report Authenticated By: Harmon Pier, Jacobson.D.      Medications: Scheduled: . diltiazem  60 mg Oral BID  . doxepin  75 mg Oral QHS  . FLUoxetine  40 mg Oral BID  . LORazepam  1 mg Oral BID  . metoCLOPramide  10 mg Oral Daily  . multivitamin with minerals  1 tablet  Oral Daily  . nadolol  20 mg Oral Daily  . nystatin  5 mL Oral QID  . sodium chloride  1,000 mL Intravenous Once   Continuous: . sodium chloride    . pantoprozole (PROTONIX) infusion 8 mg/hr (04/23/13 0320)     Assessment/Plan: Active Problems:   Anemia   Falls frequently   GI bleed  Sxatic Anemia from UGI Bleed due to Antral Ulcers -- She is s/p 2 U PRBCs and continues on IV PPI.  Hbg and HD stable. ASA and Lovenox D/ced  Candida Esophagitis - On Rx.  Multiple falls is multifactorial and due to age/AFTT/osteoarthritis of the knees/morbid obesity/Deconditioning/Sxatic Anemia. As she was on ground for a period of time we are lucky no fracture and No Rhabdo.  PT recs SNF/OT does also.  AFTT - PT/OT/CW - Will need SNF short term and ALF long term.  HTN - BP fine.  Obesity - Needs Weight loss. She declined Nutritional services.  Depression/Anxiety - on meds  CVA c Left cerebellar infarct which appears subacute to remote on CCT - Will continue RF reduction.  Dizzy - No major issues identified x the GIB and now she is stable.  Monitor  DVT Prophylaxis.  SCDs only for now.  R Shoulder in a sling - Had MRI Wed May 7th per Dr Janey Greaser and is yet to hear back.  She is in pain and loss of ability to R Shoulder contributed to current fall and inability to get up.  I called [place] [facility] for a consult to see @ what MRI showed and what the Rx plan is.  Will have them eval whether she is wearing correct sling.  I changed her to full admit.   LOS: 2 days   [name] 04/23/2013, 7:10 AM

## 2013-04-23 NOTE — Plan of Care (Signed)
Problem: Phase II Progression Outcomes Goal: Progress activity as tolerated unless otherwise ordered Outcome: Not Progressing Patient having difficulty sitting on edge of bed . Refused to sit up in chair .

## 2013-04-23 NOTE — Clinical Documentation Improvement (Signed)
Anemia Documentation Clarification Query  THIS DOCUMENT IS NOT A PERMANENT PART OF THE MEDICAL RECORD  RESPOND TO THE THIS QUERY, FOLLOW THE INSTRUCTIONS BELOW:  1. If needed, update documentation for the patient's encounter via the notes activity.  2. Access this query again and click edit on the In [employer].  3. After updating, or not, click F2 to complete all highlighted (required) fields concerning your review. Select "additional documentation in the medical record" OR "no additional documentation provided".  4. Click Sign note button.  5. The deficiency will fall out of your In Basket *Please let us know if you are not able to complete this workflow by phone or e-mail (listed below).          04/23/13  Dear Dr. Creola Corn Marton Redwood  In an effort to better capture your patient's severity of illness, reflect appropriate length of stay and utilization of resources, a review of the patient medical record has revealed the following indicators.    Based on your clinical judgment, please clarify and document in a progress note and/or discharge summary the clinical condition associated with the following supporting information:  In responding to this query please exercise your independent judgment.  The fact that a query is asked, does not imply that any particular answer is desired or expected.   Patients Hgb 11.7 on (03/14/13) presents with H/H 8.5/27.7 on (04/21/13) patient has been transfused with 2 units PRBC since admission,  please clarify type of a "anemia" being documented if known.  Thank you   Possible Clinical Conditions?  " Iron deficiency anemia  " Pernicious anemia  " Acute Blood Loss Anemia  " Other Condition  " Cannot Clinically Determine     Transfusion: 2 units PRBC   IV fluids NS @ 40 ml/hr  Serial H&H monitoring: CBC's daily   You may use possible, probable, or suspect with inpatient documentation. Possible, probable, suspected diagnoses MUST be  documented at the time of discharge.  Reviewed: case coded no response from md Thank You,  Lavonda Jumbo  Clinical Documentation Specialist: Pager [phone]  Health Information Management [place]

## 2013-04-23 NOTE — Clinical Social Work Psychosocial (Signed)
     Clinical Social Work Department BRIEF PSYCHOSOCIAL ASSESSMENT 04/23/2013  Patient:  [name], [name]     Account Number:  [id number]     Admit date:  04/21/2013  Clinical Social Worker:  Hulan Fray  Date/Time:  04/23/2013 11:21 AM  Referred by:  Physician  Date Referred:  04/21/2013 Referred for  SNF Placement   Other Referral:   Interview type:  Patient Other interview type:    PSYCHOSOCIAL DATA Living Status:  ALONE Admitted from facility:   Level of care:   Primary support name:  Orson Gear Primary support relationship to patient:  CHILD, ADULT Degree of support available:   supportive    CURRENT CONCERNS Current Concerns  Post-Acute Placement   Other Concerns:    SOCIAL WORK ASSESSMENT / PLAN Clinical Social Worker received referral for patient needing short term SNF placement at discharge. CSW introduced self and explained reason for visit. CSW explained SNF process and patient reported that her daughter will assist her in making the decision on which SNF to proceed with. CSW provided SNF packet to patient. Patient reported that her daughter mentioned [facility] SNF as a potential option.    CSW will complete FL2 for MD's signature and continue to follow and update patient and family when bed offers are made.   Assessment/plan status:  Psychosocial Support/Ongoing Assessment of Needs Other assessment/ plan:   Information/referral to community resources:   SNF packet    PATIENTS/FAMILYS RESPONSE TO PLAN OF CARE: Patient appeared agreeable for SNF placement at discharge. Patient was appreciative of CSW's visit and assistance with SNF process.

## 2013-04-24 LAB — CBC
Hemoglobin: 10.2 g/dL — ABNORMAL LOW (ref 12.0–15.0)
MCH: 28.6 pg (ref 26.0–34.0)
MCV: 89.4 fL (ref 78.0–100.0)
Platelets: 89 10*3/uL — ABNORMAL LOW (ref 150–400)
RBC: 3.57 MIL/uL — ABNORMAL LOW (ref 3.87–5.11)
WBC: 4.6 10*3/uL (ref 4.0–10.5)

## 2013-04-24 LAB — BASIC METABOLIC PANEL
CO2: 29 mEq/L (ref 19–32)
Calcium: 8.5 mg/dL (ref 8.4–10.5)
Chloride: 110 mEq/L (ref 96–112)
Glucose, Bld: 105 mg/dL — ABNORMAL HIGH (ref 70–99)
Sodium: 143 mEq/L (ref 135–145)

## 2013-04-24 MED ORDER — NYSTATIN 100000 UNIT/ML MT SUSP: 5.0000 mL | Freq: Four times a day (QID) | OROMUCOSAL | Status: DC

## 2013-04-24 MED ORDER — ASPIRIN 81 MG PO TABS: ORAL_TABLET | ORAL | Status: DC

## 2013-04-24 MED ORDER — PANTOPRAZOLE SODIUM 40 MG PO TBEC: 40.0000 mg | DELAYED_RELEASE_TABLET | Freq: Two times a day (BID) | ORAL | Status: DC

## 2013-04-24 MED ORDER — POLYETHYLENE GLYCOL 3350 17 G PO PACK: 17.0000 g | PACK | Freq: Every day | ORAL | Status: DC | PRN

## 2013-04-24 MED ORDER — ACETAMINOPHEN 325 MG PO TABS: 650.0000 mg | ORAL_TABLET | Freq: Four times a day (QID) | ORAL | Status: DC | PRN

## 2013-04-24 NOTE — Clinical Social Work Note (Addendum)
Clinical Social Worker gave patient list of bed offers received. CSW informed patient of Medicare guidelines regarding SNF placement and 3 midnight inpatient stay. CSW informed patient that this was not met during this admission and that SNF placement will be private pay.  Patient is currently reviewing SNF list with responses with private pay. CSW will continue to follow.   16:15pm CSW spoke with patient again and she is going to discuss with her daughter this evening regarding private pay for SNF vs home health options. CSW is awaiting pasarr for patient which is needed for patient to be transferred to SNF.   [name] MSW, [employer] [phone]

## 2013-04-24 NOTE — Progress Notes (Signed)
Physical Therapy Treatment Patient Details Name: [name] MRN: [id number] DOB: [birth year] Today's Date: 04/24/2013 Time: 1200-1223 PT Time Calculation (min): 23 min  PT Assessment / Plan / Recommendation Comments on Treatment Session  Pt is 67 yo female with anemia and multiple falls who continues to be limited by knee weakness and fear of falling again. She transferred our of bed to chair today with min A and RW. Continue to recommend SNF and PT will continue to follow acutely.    Follow Up Recommendations  SNF     Does the patient have the potential to tolerate intense rehabilitation     Barriers to Discharge        Equipment Recommendations  None recommended by PT    Recommendations for Other Services    Frequency Min 3X/week   Plan Discharge plan remains appropriate;Frequency remains appropriate    Precautions / Restrictions Precautions Precautions: Fall Precaution Comments: bilateral knees buckle Restrictions Weight Bearing Restrictions: No   Pertinent Vitals/Pain No c/o pain    Mobility  Bed Mobility Bed Mobility: Supine to Sit;Sitting - Scoot to Edge of Bed Supine to Sit: 4: Min assist;HOB elevated;With rails Sitting - Scoot to Edge of Bed: 3: Mod assist Details for Bed Mobility Assistance: pt has difficulty scooting due to body mass Transfers Transfers: Sit to Stand;Stand to Sit;Stand Pivot Transfers Sit to Stand: 4: Min assist;With upper extremity assist;From bed Stand to Sit: 4: Min assist;To chair/3-in-1;With upper extremity assist Stand Pivot Transfers: 4: Min assist Details for Transfer Assistance: pt needed max encouragement to get through fear to transfer out of bed. Pt shuffles feet rather than full steps, vc's needed to sequence and assist needed to move RW. vc's also for breathing to decrease panic Ambulation/Gait Ambulation/Gait Assistance: Not tested (comment) Assistive device: Rolling walker Stairs: No Wheelchair Mobility Wheelchair  Mobility: No    Exercises General Exercises - Lower Extremity Long Arc Quad: AROM;5 reps;Both;Strengthening;Seated (with 3 sec hold and slow descent)   PT Diagnosis:    PT Problem List:   PT Treatment Interventions:     PT Goals Acute Rehab PT Goals PT Goal Formulation: With patient Time For Goal Achievement: 04/29/13 Potential to Achieve Goals: Good Pt will go Supine/Side to Sit: with supervision PT Goal: Supine/Side to Sit - Progress: Progressing toward goal Pt will go Sit to Supine/Side: with supervision PT Goal: Sit to Supine/Side - Progress: Progressing toward goal Pt will go Sit to Stand: with supervision PT Goal: Sit to Stand - Progress: Progressing toward goal Pt will go Stand to Sit: with supervision PT Goal: Stand to Sit - Progress: Progressing toward goal Pt will Transfer Bed to Chair/Chair to Bed: with supervision PT Transfer Goal: Bed to Chair/Chair to Bed - Progress: Goal set today Pt will Ambulate: 16 - 50 feet;with min assist PT Goal: Ambulate - Progress: Not progressing  Visit Information  Last PT Received On: 04/24/13 Assistance Needed: +2    Subjective Data  Subjective: pt continues to be VERY fearful of mobilty b/c of falling Patient Stated Goal: Not fall.   Cognition  Cognition Arousal/Alertness: Awake/alert Behavior During Therapy: WFL for tasks assessed/performed Overall Cognitive Status: Within Functional Limits for tasks assessed (though pt slightly slow to process)    Balance  Balance Balance Assessed: Yes Static Standing Balance Static Standing - Balance Support: Bilateral upper extremity supported Static Standing - Level of Assistance: 4: Min assist Static Standing - Comment/# of Minutes: 3 min  End of Session PT - End of Session  Equipment Utilized During Treatment: Gait belt Activity Tolerance: Treatment limited secondary to agitation Patient left: in chair;with call bell/phone within reach Nurse Communication: Mobility status   GP    Lyanne Co, PT  Acute Rehab Services  [phone]   Lyanne Co 04/24/2013, 1:31 PM

## 2013-04-24 NOTE — Progress Notes (Signed)
Spoke with Dr. Timothy Lasso concerning pt still here in hospital.

## 2013-04-24 NOTE — Discharge Summary (Addendum)
Physician Discharge Summary  DISCHARGE SUMMARY   Patient ID: MEESHA SEK MR#: [id number] DOB/AGE: 67-Dec-1947 67 y.o.   Attending Physician:Jazia Faraci M  Patient's PCP:No primary provider on file.  Consults:  GI - Dr Bosie Clos  Admit date: 04/21/2013 Discharge date: 04/24/2013  Discharge Diagnoses:  Active Problems:   Anemia   Falls frequently   GI bleed   Patient Active Problem List   Diagnosis Date Noted  . GI bleed 04/22/2013  . Anemia 04/21/2013  . Falls frequently 04/21/2013  . Exogenous obesity   . Anxiety and depression   . Hiatal hernia   . Osteoarthritis   . Atypical chest pain   . Memory changes   . Bipolar disorder   . Benign hypertensive heart disease without heart failure 11/09/2011  . Chest pain 11/09/2011  . Anxiety 11/09/2011   Past Medical History  Diagnosis Date  . Exogenous obesity   . Hypertension   . Anxiety and depression     chronic  . Hiatal hernia   . Osteoarthritis   . Atypical chest pain 06/10/03    normal 2 day adenosine cardiolite  . Memory changes   . Bipolar disorder     Discharged Condition: stable  Discharge Medications:   Medication List    STOP taking these medications       LORazepam 1 MG tablet  Commonly known as:  ATIVAN      TAKE these medications       acetaminophen 325 MG tablet  Commonly known as:  TYLENOL  Take 2 tablets (650 mg total) by mouth every 6 (six) hours as needed.     aspirin 81 MG tablet  Take 1 tablet (81 mg total) by mouth daily. Restart on June 10     diltiazem 60 MG tablet  Commonly known as:  CARDIZEM  Take 1 tablet (60 mg total) by mouth 2 (two) times daily.     doxepin 75 MG capsule  Commonly known as:  SINEQUAN  Take 75 mg by mouth.     FLUoxetine 40 MG capsule  Commonly known as:  PROZAC  Take 40 mg by mouth 2 (two) times daily. Per Dr.Pulis     Melatonin 3 MG Caps  Take 1 capsule by mouth daily.     metoCLOPramide 10 MG tablet  Commonly known as:  REGLAN   Take 10 mg by mouth daily.     multivitamin tablet  Take 1 tablet by mouth daily.     nadolol 20 MG tablet  Commonly known as:  CORGARD  Take 20 mg by mouth daily.     nystatin 100000 UNIT/ML suspension  Commonly known as:  MYCOSTATIN  Take 5 mLs (500,000 Units total) by mouth 4 (four) times daily.     pantoprazole 40 MG tablet  Commonly known as:  PROTONIX  Take 1 tablet (40 mg total) by mouth 2 (two) times daily.     polyethylene glycol packet  Commonly known as:  MIRALAX / GLYCOLAX  Take 17 g by mouth daily as needed.        Hospital Procedures: Ct Head Wo Contrast  04/21/2013   *RADIOLOGY REPORT*  Clinical Data: Fall with head injury and vertigo.  CT HEAD WITHOUT CONTRAST  Technique:  Contiguous axial images were obtained from the base of the skull through the vertex without contrast.  Comparison: None  Findings: A left cerebellar infarct appears subacute - remote.  No acute intracranial abnormalities are identified, including mass lesion or mass effect, hydrocephalus,  extra-axial fluid collection, midline shift, hemorrhage, or acute infarction.  The visualized bony calvarium is unremarkable.  IMPRESSION: No evidence of acute intracranial abnormality.  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   Original Report Authenticated By: Harmon Pier, M.D.    History of Present Illness: 67 year old F with essential hypertension, morbid obesity, osteoarthritis, depression and chronic anxiety/depression presenting 04/21/13 with multiple falls and weakness having been found on the floor for at least 12 hours. Recent multiple falls on and off for quite some time.  One of the falls injured her R Shoulder and she saw Ortho/Had MRI and has been maintained in a sling.  Placement has indeed been urged by me several times in the past. She has declined this. She has been her baseline health but her mobility is impacted both by the osteoarthritis of flexing her knees as well as her morbid  obesity and recent shoulder issues. After falling and not being able to get up EMS was called and she presented to the ED.  She denied any chest pain or shortness of breath. She's had no change in bowel or bladder and no nausea or vomiting. She denied any black tarry stools. Last lab work was in early April at [facility] medical with a hemoglobin documented at 11.3. Between being found in the floor and unable to get up with profound weakness, hemoglobin is markedly depressed and heme-positive stools she was admitted for hydration, transfusion of packed red blood cells and social work for eval of higher level of care    Hospital Course: Admitted c Sxatic Anemia S/p 2 units of PRBCs and GI consult Dr Bosie Clos did EGD on her 5/12 and found Distal Candida esophagitis - s/p brushings - started on Nystatin swish and swallow; Small gastric ulcers with bleeding stigmata - likely source of the melena and anemia; Antral gastritis. Did PPI infusion initially then changed to IV PPI Q 12 hours and now oral PPI. As far as Falls, AFTT, Generalized weakness and R Shoulder in a sling we are awaiting PT/OT/CW and Ortho evals. She will need SNF on D/C    Sxatic Anemia from UGI Bleed due to Antral Ulcers -- She is s/p 2 U PRBCs and continues on PPI-Protonix 40 BID dosing for 8 weeks, then transition to once-a-day dosing indefinitely thereafter. Hbg and HD stable. ASA and Lovenox D/ced.  OK to restart ASA @ June 10.  No NSAIDs.  Patient should call [phone] to schedule follow-up with Dr. Madilyn Fireman, her primary gastroenterologist.    Candida Esophagitis - On Rx - per GI - 10-day course of nystatin for presumed candida esophagitis  Multiple falls are multifactorial and due to age/AFTT/osteoarthritis of the knees/morbid obesity/Deconditioning/Sxatic Anemia and shoulder issues. As she was on ground for a period of time we are lucky no fracture and No Rhabdo. PT OT recs SNF and she can go today.   AFTT - PT/OT/CW - Will need SNF  short term and ALF long term. FL-2 Signed.  HTN - BP fine.   Obesity - Needs Weight loss. She declined Nutritional services.   Depression/Anxiety - on meds.  She is off Ativan  CVA c Left cerebellar infarct which appears subacute to remote on CCT - Will continue RF reduction.   Dizzy - No major issues identified x the GIB and now she is stable. Monitor   DVT SCDs provided in the hospital.   R Shoulder in a sling - Had MRI Wed May 7th per Dr Janey Greaser and is yet to  hear back. She is in pain and loss of mobility to R Shoulder contributed to current fall and inability to get up. I called [facility] for a consult on Monday and they are yet to see her.  i called back today and told them of her pending D/c.  They were confused as she missed her appt with Dr Janey Greaser to go over the MRI.  I told them that there must have been some miscommunication as per my phone call on Monday they should have known she would not make it do to the hospitalization.  She still does not know what MRI showed and what the Rx plan is. I called [facility] again at [phone] and asked to have someone see her prior to D/c - They will do their best.  If not an appt will need to be made at their office.  I did talk with Dr Janey Greaser and MRI did not show any Fxs or teas but severe edema and bruising - May need Cortisone injection in 2-4 weeks.  If Ortho does not recommend a specific sling - PT at facility may need to recommend.  She was seen and evaluated 04/24/13 and she is in good sprits without any new issues and CBC is stable.  Platelets can be followed.   Day of Discharge Exam BP 122/61  Pulse 84  Temp(Src) 98.3 F (36.8 C) (Oral)  Resp 20  Ht 5\' 6"  (1.676 m)  Wt 127.007 kg (280 lb)  BMI 45.21 kg/m2  SpO2 95%  Physical Exam: General appearance: alert, cooperative and no distress. Snores.  Head: Normocephalic, without obvious abnormality, atraumatic  Eyes: conjunctivae/corneas clear. PERRL, EOM's intact.  Nose: Nares normal.  Septum midline. Mucosa normal. No drainage or sinus tenderness.  Throat: lips, mucosa, and tongue normal; teeth and gums normal  Neck: no adenopathy, no carotid bruit, no JVD and thyroid not enlarged, symmetric, no tenderness/mass/nodules  Resp: clear to auscultation bilaterally  Cardio: regular rate and rhythm, S1, S2 normal, no murmur, click, rub or gallop  GI: soft, non-tender; bowel sounds normal; no masses, no organomegaly, morbidly obese  Extremities: R shoulder supposed to be in a sling.  Pulses: 2+ and symmetric   Discharge Labs:  Recent Labs  04/23/13 0505 04/24/13 0525  NA 141 143  K 3.9 4.1  CL 109 110  CO2 26 29  GLUCOSE 87 105*  BUN 16 14  CREATININE 0.81 0.81  CALCIUM 8.4 8.5    Recent Labs  04/22/13 0650  AST 39*  ALT 20  ALKPHOS 72  BILITOT 3.0*  PROT 5.3*  ALBUMIN 2.4*    Recent Labs  04/21/13 1325  04/23/13 0505 04/24/13 0525  WBC 4.5  < > 3.2* 4.6  NEUTROABS 3.0  --   --   --   HGB 8.5*  < > 9.6* 10.2*  HCT 27.7*  < > 30.7* 31.9*  MCV 91.7  < > 89.5 89.4  PLT 132*  < > 89* 89*  < > = values in this interval not displayed.  Recent Labs  04/21/13 1325  CKTOTAL 97   No results found for this basename: TSH, T4TOTAL, FREET3, T3FREE, THYROIDAB,  in the last 72 hours No results found for this basename: VITAMINB12, FOLATE, FERRITIN, TIBC, IRON, RETICCTPCT,  in the last 72 hours No results found for this basename: INR,  PROTIME       Discharge instructions: Discharge Orders   Future Orders Complete By Expires     Ambulatory referral to  Home Health  As directed     Comments:      Please evaluate KAYLIA WINBORNE for admission to [facility].  Disciplines requested: Nursing  Services to provide: Evaluate  Physician to follow patient's care (the person listed here will be responsible for signing ongoing orders): PCP  Requested Start of Care Date: Tomorrow  Special Instructions:  Call daughter Barrie Dunker at cell [phone]        Follow-up Information   Follow up with Gwen Pounds, MD In 4 weeks. (2 weeks post D/C from SNF)    Contact information:   [address] [facility] [place] [address] [phone]       Follow up with Barrie Folk, MD.   Contact information:   [address] [place]                         [place] [address] [phone]        Disposition: SNF  Follow-up Appts: Follow-up with Dr. Timothy Lasso at [facility] 1-2 weeks post D/C from SNF - Hopefully will be d/ced to ALF.  Call for appointment.  Condition on Discharge: good  Tests Needing Follow-up: CBC and BMET  Time spent in discharge (includes decision making & examination of pt): *35 min  Signed: Kimberly Coye M 04/24/2013, 12:47 PM

## 2013-04-25 LAB — BASIC METABOLIC PANEL
BUN: 13 mg/dL (ref 6–23)
CO2: 23 mEq/L (ref 19–32)
Calcium: 8.4 mg/dL (ref 8.4–10.5)
Chloride: 109 mEq/L (ref 96–112)
Creatinine, Ser: 0.73 mg/dL (ref 0.50–1.10)
Glucose, Bld: 106 mg/dL — ABNORMAL HIGH (ref 70–99)

## 2013-04-25 LAB — CBC
HCT: 33 % — ABNORMAL LOW (ref 36.0–46.0)
MCH: 27.8 pg (ref 26.0–34.0)
MCV: 89.9 fL (ref 78.0–100.0)
Platelets: 93 10*3/uL — ABNORMAL LOW (ref 150–400)
RBC: 3.67 MIL/uL — ABNORMAL LOW (ref 3.87–5.11)
WBC: 3.6 10*3/uL — ABNORMAL LOW (ref 4.0–10.5)

## 2013-04-25 MED ORDER — DSS 100 MG PO CAPS: 100.0000 mg | ORAL_CAPSULE | Freq: Every day | ORAL | Status: DC

## 2013-04-25 MED ORDER — BISACODYL 10 MG RE SUPP
10.0000 mg | Freq: Once | RECTAL | Status: AC
  Administered 2013-04-25: 10 mg via RECTAL
  Filled 2013-04-25: qty 1

## 2013-04-25 MED ORDER — DOCUSATE SODIUM 100 MG PO CAPS
100.0000 mg | ORAL_CAPSULE | Freq: Every day | ORAL | Status: DC
  Administered 2013-04-25: 100 mg via ORAL
  Filled 2013-04-25: qty 1

## 2013-04-25 NOTE — Discharge Summary (Addendum)
Physician Discharge Summary  DISCHARGE SUMMARY   Patient ID: [name] MR#: [id number] DOB/AGE: [birth year] 67 y.o.   Attending Physician:Gina Leblond M  Patient's PCP:No primary provider on file.  Consults:  GI - Dr Bosie Clos  Admit date: 04/21/2013 Discharge date: 04/25/2013  Discharge Diagnoses:  Active Problems:   Anemia   Falls frequently   GI bleed   Patient Active Problem List   Diagnosis Date Noted  . GI bleed 04/22/2013  . Anemia 04/21/2013  . Falls frequently 04/21/2013  . Exogenous obesity   . Anxiety and depression   . Hiatal hernia   . Osteoarthritis   . Atypical chest pain   . Memory changes   . Bipolar disorder   . Benign hypertensive heart disease without heart failure 11/09/2011  . Chest pain 11/09/2011  . Anxiety 11/09/2011   Past Medical History  Diagnosis Date  . Exogenous obesity   . Hypertension   . Anxiety and depression     chronic  . Hiatal hernia   . Osteoarthritis   . Atypical chest pain 06/10/03    normal 2 day adenosine cardiolite  . Memory changes   . Bipolar disorder     Discharged Condition: stable  Discharge Medications:   Medication List    STOP taking these medications       LORazepam 1 MG tablet  Commonly known as:  ATIVAN      TAKE these medications       acetaminophen 325 MG tablet  Commonly known as:  TYLENOL  Take 2 tablets (650 mg total) by mouth every 6 (six) hours as needed.     aspirin 81 MG tablet  Take 1 tablet (81 mg total) by mouth daily. Restart on June 10     diltiazem 60 MG tablet  Commonly known as:  CARDIZEM  Take 1 tablet (60 mg total) by mouth 2 (two) times daily.     doxepin 75 MG capsule  Commonly known as:  SINEQUAN  Take 75 mg by mouth.     DSS 100 MG Caps  Take 100 mg by mouth daily.     FLUoxetine 40 MG capsule  Commonly known as:  PROZAC  Take 40 mg by mouth 2 (two) times daily. Per Dr.Pulis     Melatonin 3 MG Caps  Take 1 capsule by mouth daily.      metoCLOPramide 10 MG tablet  Commonly known as:  REGLAN  Take 10 mg by mouth daily.     multivitamin tablet  Take 1 tablet by mouth daily.     nadolol 20 MG tablet  Commonly known as:  CORGARD  Take 20 mg by mouth daily.     nystatin 100000 UNIT/ML suspension  Commonly known as:  MYCOSTATIN  Take 5 mLs (500,000 Units total) by mouth 4 (four) times daily.     pantoprazole 40 MG tablet  Commonly known as:  PROTONIX  Take 1 tablet (40 mg total) by mouth 2 (two) times daily.     polyethylene glycol packet  Commonly known as:  MIRALAX / GLYCOLAX  Take 17 g by mouth daily as needed.        Hospital Procedures: Ct Head Wo Contrast  04/21/2013   *RADIOLOGY REPORT*  Clinical Data: Fall with head injury and vertigo.  CT HEAD WITHOUT CONTRAST  Technique:  Contiguous axial images were obtained from the base of the skull through the vertex without contrast.  Comparison: None  Findings: A left cerebellar infarct appears subacute -  remote.  No acute intracranial abnormalities are identified, including mass lesion or mass effect, hydrocephalus, extra-axial fluid collection, midline shift, hemorrhage, or acute infarction.  The visualized bony calvarium is unremarkable.  IMPRESSION: No evidence of acute intracranial abnormality.  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   Original Report Authenticated By: Harmon Pier, M.D.    History of Present Illness: 67 year old F with essential hypertension, morbid obesity, osteoarthritis, depression and chronic anxiety/depression presenting 04/21/13 with multiple falls and weakness having been found on the floor for at least 12 hours. Recent multiple falls on and off for quite some time.  One of the falls injured her R Shoulder and she saw Ortho/Had MRI and has been maintained in a sling.  Placement has indeed been urged by me several times in the past. She has declined this. She has been her baseline health but her mobility is impacted both by the  osteoarthritis of flexing her knees as well as her morbid obesity and recent shoulder issues. After falling and not being able to get up EMS was called and she presented to the ED.  She denied any chest pain or shortness of breath. She's had no change in bowel or bladder and no nausea or vomiting. She denied any black tarry stools. Last lab work was in early April at [facility] medical with a hemoglobin documented at 11.3. Between being found in the floor and unable to get up with profound weakness, hemoglobin is markedly depressed and heme-positive stools she was admitted for hydration, transfusion of packed red blood cells and social work for eval of higher level of care    Hospital Course: Admitted c Sxatic Anemia S/p 2 units of PRBCs and GI consult Dr Bosie Clos did EGD on her 5/12 and found Distal Candida esophagitis - s/p brushings - started on Nystatin swish and swallow; Small gastric ulcers with bleeding stigmata - likely source of the melena and anemia; Antral gastritis. Did PPI infusion initially then changed to IV PPI Q 12 hours and now oral PPI. As far as Falls, AFTT, Generalized weakness and R Shoulder in a sling we are s/p PT/OT/CW and Ortho evals. She will need SNF on D/C.   Sxatic Anemia from UGI Bleed due to Antral Ulcers -- She is s/p 2 U PRBCs and continues on PPI-Protonix 40 BID dosing for 8 weeks, then transition to once-a-day dosing indefinitely thereafter. Hbg and HD stable. ASA and Lovenox D/ced.  OK to restart ASA @ June 10.  No NSAIDs.  Patient should call [phone] to schedule follow-up with Dr. Madilyn Fireman, her primary gastroenterologist in mid June.    Candida Esophagitis - On Rx - per GI - 10-day course of nystatin for presumed candida esophagitis  Multiple falls are multifactorial and due to age/AFTT/osteoarthritis of the knees/morbid obesity/Deconditioning/Sxatic Anemia and shoulder issues. As she was on the ground for a period of time we are lucky no fracture and No Rhabdo. PT OT  recs SNF and she could have gone yesterday but will go today.   AFTT - PT/OT/CW - Will need SNF short term and ALF long term. FL-2 Signed. < 30 day letter signed due to psychiatric history to get PASSAR #.  She is motivated and will progress toward Home with HH or ALF or live with one of her daughters.  HTN - BP fine on current meds.   Obesity - Needs Weight loss. She declined Nutritional services.   Depression/Anxiety - on meds.  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   Dizzy - No major issues identified x the GIB and now she is stable. Monitor.  No further dizzy.  DVT proph - SCDs provided in the hospital.   R Shoulder in a sling - Had MRI Wed May 7th per Dr Janey Greaser at [facility].  Dr Thomasena Edis is her primary ortho regarding her knees which are in bad shape.  She is in pain c loss of mobility to R Shoulder which contributed to current fall and inability to get up.  I did talk with Dr Janey Greaser 04/24/13 and MRI did not show any Fxs or tears but severe edema and bruising - May need Cortisone injection in 2-4 weeks.  If Ortho does not recommend a specific sling - PT at facility may need to recommend.  The sling she is wearing is a poor fit and store bought by her daughter.  She was seen and evaluated 04/25/13 and she is in good sprits without any new issues and CBC is stable again with Hbg 10.2.  Platelets can be followed and stable 89-93.  Iron can be added when she is more mobile as for fear of constipation.  Clinical Social Worker gave patient list of bed offers received on 04/24/13. CSW informed patient of Medicare guidelines regarding SNF placement and 3 midnight inpatient stay - she was admitted on Sunday afternoon so she has already spent 4 Midnights in the hospital. They were told she will now be private pay for the SNF?  The CW was going to discuss with the pts daughter regarding private pay for SNF vs home health  options. CSW is awaiting pasarr for patient which is needed for patient to be transferred to SNF. I will discuss these options later with pt/family and since she was a full admit and has skillable needs I do not see why SNF will not be covered.       Day of Discharge Exam BP 137/75  Pulse 83  Temp(Src) 98 F (36.7 C) (Oral)  Resp 17  Ht 5\' 6"  (1.676 m)  Wt 127.007 kg (280 lb)  BMI 45.21 kg/m2  SpO2 96%  Physical Exam: General appearance: alert, cooperative and no distress. Snores.  Head: Normocephalic, without obvious abnormality, atraumatic  Eyes: conjunctivae/corneas clear. PERRL, EOM's intact.  Nose: Nares normal. Septum midline. Mucosa normal. No drainage or sinus tenderness.  Throat: lips, mucosa, and tongue normal; teeth and gums normal  Neck: no adenopathy, no carotid bruit, no JVD and thyroid not enlarged, symmetric, no tenderness/mass/nodules  Resp: clear to auscultation bilaterally  Cardio: regular rate and rhythm, S1, S2 normal, no murmur, click, rub or gallop  GI: soft, non-tender; bowel sounds normal; no masses, no organomegaly, morbidly obese  Extremities: R shoulder supposed to be in a sling.  Pulses: 2+ and symmetric   Discharge Labs:  Recent Labs  04/24/13 0525 04/25/13 0455  NA 143 141  K 4.1 4.1  CL 110 109  CO2 29 23  GLUCOSE 105* 106*  BUN 14 13  CREATININE 0.81 0.73  CALCIUM 8.5 8.4   No results found for this basename: AST, ALT, ALKPHOS, BILITOT, PROT, ALBUMIN,  in the last 72 hours  Recent Labs  04/24/13 0525 04/25/13 0455  WBC 4.6 3.6*  HGB 10.2* 10.2*  HCT 31.9* 33.0*  MCV 89.4 89.9  PLT 89* 93*   No results found for this basename: CKTOTAL, CKMB, CKMBINDEX, TROPONINI,  in the last 72  hours No results found for this basename: TSH, T4TOTAL, FREET3, T3FREE, THYROIDAB,  in the last 72 hours No results found for this basename: VITAMINB12, FOLATE, FERRITIN, TIBC, IRON, RETICCTPCT,  in the last 72 hours No results found for this  basename: INR,  PROTIME       Discharge instructions: Discharge Orders   Future Orders Complete By Expires     Ambulatory referral to Home Health  As directed     Comments:      Please evaluate BRET STAMOUR for admission to [facility].  Disciplines requested: Nursing  Services to provide: Evaluate  Physician to follow patient's care (the person listed here will be responsible for signing ongoing orders): PCP  Requested Start of Care Date: Tomorrow  Special Instructions:  Call daughter Barrie Dunker at cell [phone]       Follow-up Information   Follow up with Gwen Pounds, MD In 4 weeks. (2 weeks post D/C from SNF)    Contact information:   [address] [facility] [place] [address] [phone]       Follow up with Barrie Folk, MD.   Contact information:   [address] [place] [place]                         [place] [address] [phone]        Disposition: SNF  Follow-up Appts: Follow-up with Dr. Timothy Lasso at [facility] 1-2 weeks post D/C from SNF - Hopefully will be d/ced to ALF.  Call for appointment.  Condition on Discharge: good  Tests Needing Follow-up: CBC and BMET  Time spent in discharge (includes decision making & examination of pt): *35 min  Signed: Jailyn Langhorst M 04/25/2013, 8:07 AM

## 2013-04-25 NOTE — Clinical Social Work Placement (Signed)
     Clinical Social Work Department CLINICAL SOCIAL WORK PLACEMENT NOTE 04/25/2013  Patient:  [name], [name]  Account Number:  [id number] Admit date:  04/21/2013  Clinical Social Worker:  Hulan Fray  Date/time:  04/25/2013 10:24 AM  Clinical Social Work is seeking post-discharge placement for this patient at the following level of care:   SKILLED NURSING   (*CSW will update this form in Epic as items are completed)   04/25/2013  Patient/family provided with [facility] Department of Clinical Social Works list of facilities offering this level of care within the geographic area requested by the patient (or if unable, by the patients family).  04/25/2013  Patient/family informed of their freedom to choose among providers that offer the needed level of care, that participate in Medicare, Medicaid or managed care program needed by the patient, have an available bed and are willing to accept the patient.  04/25/2013  Patient/family informed of [facility] ownership interest in [facility], as well as of the fact that they are under no obligation to receive care at this facility.  PASARR submitted to EDS on 04/24/2014 PASARR number received from EDS on 04/25/2013  FL2 transmitted to all facilities in geographic area requested by pt/family on  04/24/2013 FL2 transmitted to all facilities within larger geographic area on   Patient informed that his/her managed care company has contracts with or will negotiate with  certain facilities, including the following:     Patient/family informed of bed offers received:  04/24/2013 Patient chooses bed at [facility] Physician recommends and patient chooses bed at    Patient to be transferred to [facility] on  04/25/2013 Patient to be transferred to facility by [facility] triad ambulance  The following physician request were entered in Epic:   Additional Comments:

## 2013-04-25 NOTE — Clinical Social Work Note (Signed)
Clinical Social Worker followed up with patient regarding decision of SNF placement and patient requested CSW to call her daughter. CSW spoke with daughter who confirms [facility] and understands the private pay. CSW will send the information to the facility and facilitate discharge. Patient will be transported via ambulance. Patient's discharge packet will be placed with shadow chart. CSW will sign off, as social work intervention is no longer needed.   Rozetta Nunnery MSW, [employer] [phone]

## 2013-04-25 NOTE — Progress Notes (Signed)
DC to [facility] by ambulance. Daughter aware.

## 2013-05-12 DIAGNOSIS — Z9289 Personal history of other medical treatment: Secondary | ICD-10-CM

## 2013-05-12 HISTORY — DX: Personal history of other medical treatment: Z92.89

## 2013-08-07 ENCOUNTER — Other Ambulatory Visit (HOSPITAL_COMMUNITY): Payer: Medicare Other

## 2013-08-08 ENCOUNTER — Emergency Department (HOSPITAL_COMMUNITY)
Admission: EM | Admit: 2013-08-08 | Discharge: 2013-08-08 | Disposition: A | Discharge status: Home / Self Care | Payer: Medicare Other | Attending: Emergency Medicine | Admitting: Emergency Medicine

## 2013-08-08 ENCOUNTER — Encounter (HOSPITAL_COMMUNITY): Payer: Self-pay | Admitting: *Deleted

## 2013-08-08 ENCOUNTER — Emergency Department (HOSPITAL_COMMUNITY): Payer: Medicare Other

## 2013-08-08 DIAGNOSIS — R609 Edema, unspecified: Secondary | ICD-10-CM | POA: Insufficient documentation

## 2013-08-08 DIAGNOSIS — R0609 Other forms of dyspnea: Secondary | ICD-10-CM | POA: Insufficient documentation

## 2013-08-08 DIAGNOSIS — M7989 Other specified soft tissue disorders: Secondary | ICD-10-CM | POA: Insufficient documentation

## 2013-08-08 DIAGNOSIS — Z79899 Other long term (current) drug therapy: Secondary | ICD-10-CM | POA: Insufficient documentation

## 2013-08-08 DIAGNOSIS — Z7982 Long term (current) use of aspirin: Secondary | ICD-10-CM | POA: Insufficient documentation

## 2013-08-08 DIAGNOSIS — R0989 Other specified symptoms and signs involving the circulatory and respiratory systems: Secondary | ICD-10-CM | POA: Insufficient documentation

## 2013-08-08 DIAGNOSIS — R059 Cough, unspecified: Secondary | ICD-10-CM | POA: Insufficient documentation

## 2013-08-08 DIAGNOSIS — R062 Wheezing: Secondary | ICD-10-CM | POA: Insufficient documentation

## 2013-08-08 DIAGNOSIS — Z8659 Personal history of other mental and behavioral disorders: Secondary | ICD-10-CM | POA: Insufficient documentation

## 2013-08-08 DIAGNOSIS — R05 Cough: Secondary | ICD-10-CM | POA: Insufficient documentation

## 2013-08-08 DIAGNOSIS — R0602 Shortness of breath: Secondary | ICD-10-CM

## 2013-08-08 DIAGNOSIS — R0789 Other chest pain: Secondary | ICD-10-CM | POA: Insufficient documentation

## 2013-08-08 DIAGNOSIS — F341 Dysthymic disorder: Secondary | ICD-10-CM | POA: Insufficient documentation

## 2013-08-08 DIAGNOSIS — I10 Essential (primary) hypertension: Secondary | ICD-10-CM | POA: Insufficient documentation

## 2013-08-08 DIAGNOSIS — M1990 Unspecified osteoarthritis, unspecified site: Secondary | ICD-10-CM | POA: Insufficient documentation

## 2013-08-08 LAB — BASIC METABOLIC PANEL
BUN: 16 mg/dL (ref 6–23)
CO2: 30 mEq/L (ref 19–32)
Chloride: 104 mEq/L (ref 96–112)
Creatinine, Ser: 0.78 mg/dL (ref 0.50–1.10)
GFR calc Af Amer: >90 mL/min (ref >90)
Glucose, Bld: 118 mg/dL — ABNORMAL HIGH (ref 70–99)

## 2013-08-08 LAB — TROPONIN I: Troponin I: <0.3 ng/mL (ref <0.30)

## 2013-08-08 LAB — CBC
Platelets: 105 10*3/uL — ABNORMAL LOW (ref 150–400)
RBC: 3.79 MIL/uL — ABNORMAL LOW (ref 3.87–5.11)
RDW: 19.5 % — ABNORMAL HIGH (ref 11.5–15.5)

## 2013-08-08 MED ORDER — FUROSEMIDE 10 MG/ML IJ SOLN
20.0000 mg | Freq: Once | INTRAMUSCULAR | Status: AC
  Administered 2013-08-08: 20 mg via INTRAVENOUS
  Filled 2013-08-08: qty 2

## 2013-08-08 NOTE — ED Notes (Signed)
Nanavati, MD at bedside. 

## 2013-08-08 NOTE — ED Notes (Signed)
Pt denies SOB at this time.

## 2013-08-08 NOTE — ED Notes (Addendum)
Patient states chest pain/pressure starting today at approx 1230, patient with one nitro at home and asa 324mg , nitro x 1 per ems .  Patient states pressure down to 6/10 at this time.  Patient also states increasing edema over past few days and cellulitis type infection starting in legs x 1 week

## 2013-08-08 NOTE — ED Provider Notes (Addendum)
CSN: [id number]     Arrival date & time 08/08/13  1432 History   First MD Initiated Contact with Patient 08/08/13 1519     Chief Complaint  Patient presents with  . Chest Pain   (Consider location/radiation/quality/duration/timing/severity/associated sxs/prior Treatment) HPI Comments: 67 year old F with essential hypertension, morbid obesity, no underlying lung disease, no known CAD, CHF comes in with cc of dib, chest pain. Pt states that she has been having intermittent chest pain and dyspnea since May. She was started on lasix, lost 30 lbs, and feeling better, but for the past 1 week - she has been having intermittent chest pain - left sided, pressure like and some dyspnea. The dyspnea is exertional, and also comes at rest. She has no new orthopnea, PND. No hx of PE, DVT. She feels like she has picked up 2-3 lbs over the dry weight.  Patient is a 67 y.o. female presenting with chest pain. The history is provided by the patient.  Chest Pain Associated symptoms: cough and shortness of breath   Associated symptoms: no abdominal pain, no fever, no headache, no nausea, no palpitations and not vomiting     Past Medical History  Diagnosis Date  . Exogenous obesity   . Hypertension   . Anxiety and depression     chronic  . Hiatal hernia   . Osteoarthritis   . Atypical chest pain 06/10/03    normal 2 day adenosine cardiolite  . Memory changes   . Bipolar disorder    Past Surgical History  Procedure Laterality Date  . Tubal ligation  1977  . Other surgical history  1980    hysterectomy  . Cardiac catheterization  1990    normal cath  . Esophagogastroduodenoscopy N/A 04/22/2013    Procedure: ESOPHAGOGASTRODUODENOSCOPY (EGD);  Surgeon: Shirley Friar, MD;  Location: [facility] ENDOSCOPY;  Service: Endoscopy;  Laterality: N/A;   Family History  Problem Relation Age of Onset  . Heart attack Mother   . Heart disease Mother   . Heart disease Father   . Heart attack Father    History   Substance Use Topics  . Smoking status: Never Smoker   . Smokeless tobacco: Never Used  . Alcohol Use: No   OB History   Grav Para Term Preterm Abortions TAB SAB Ect Mult Living                 Review of Systems  Constitutional: Positive for activity change. Negative for fever and chills.  HENT: Negative for neck pain.   Respiratory: Positive for cough, chest tightness, shortness of breath and wheezing.   Cardiovascular: Positive for chest pain and leg swelling. Negative for palpitations.  Gastrointestinal: Negative for nausea, vomiting and abdominal pain.  Genitourinary: Negative for dysuria.  Neurological: Negative for headaches.  Hematological: Does not bruise/bleed easily.    Allergies  Codeine and Sulfa antibiotics  Home Medications   Current Outpatient Rx  Name  Route  Sig  Dispense  Refill  . acetaminophen (TYLENOL) 325 MG tablet   Oral   Take 650 mg by mouth every 6 (six) hours as needed for pain.         Marland Kitchen albuterol (PROVENTIL) (2.5 MG/3ML) 0.083% nebulizer solution   Nebulization   Take 2.5 mg by nebulization 4 (four) times daily as needed for wheezing.         Marland Kitchen aspirin 81 MG tablet      Take 1 tablet (81 mg total) by mouth daily. Restart on June  10   30 tablet      . diltiazem (CARDIZEM) 60 MG tablet   Oral   Take 1 tablet (60 mg total) by mouth 2 (two) times daily.   180 tablet   3   . doxepin (SINEQUAN) 75 MG capsule   Oral   Take 75 mg by mouth at bedtime.          Marland Kitchen FLUoxetine (PROZAC) 40 MG capsule   Oral   Take 40 mg by mouth 2 (two) times daily. Per Dr.Pulis         . furosemide (LASIX) 40 MG tablet   Oral   Take 40 mg by mouth daily.         Marland Kitchen LORazepam (ATIVAN) 0.5 MG tablet   Oral   Take 0.5 mg by mouth 2 (two) times daily as needed for anxiety.         . Melatonin 3 MG CAPS   Oral   Take 1 capsule by mouth at bedtime.          . metoCLOPramide (REGLAN) 5 MG tablet   Oral   Take 5 mg by mouth 2 (two) times  daily.         . Multiple Vitamins-Minerals (CERTA-VITE PO)   Oral   Take 1 tablet by mouth daily.         . nadolol (CORGARD) 20 MG tablet   Oral   Take 20 mg by mouth daily.         . pantoprazole (PROTONIX) 40 MG tablet   Oral   Take 1 tablet (40 mg total) by mouth 2 (two) times daily.         Marland Kitchen docusate sodium (COLACE) 100 MG capsule   Oral   Take 100 mg by mouth daily as needed for constipation.         . nitroGLYCERIN (NITROSTAT) 0.4 MG SL tablet   Sublingual   Place 0.4 mg under the tongue every 5 (five) minutes as needed for chest pain.         . polyethylene glycol (MIRALAX / GLYCOLAX) packet   Oral   Take 17 g by mouth daily as needed (constipation).          BP 112/63  Pulse 74  Temp(Src) 98.2 F (36.8 C) (Oral)  Resp 14  Ht 5\' 6"  (1.676 m)  Wt 302 lb (136.986 kg)  BMI 48.77 kg/m2  SpO2 95% Physical Exam  Nursing note and vitals reviewed. Constitutional: She is oriented to person, place, and time. She appears well-developed and well-nourished.  HENT:  Head: Normocephalic and atraumatic.  Eyes: EOM are normal. Pupils are equal, round, and reactive to light.  Neck: Neck supple. No JVD present.  Cardiovascular: Normal rate and regular rhythm.   Murmur heard. Pulmonary/Chest: Effort normal. No respiratory distress.  Abdominal: Soft. She exhibits no distension. There is no tenderness. There is no rebound and no guarding.  Musculoskeletal: She exhibits edema. She exhibits no tenderness.  3+ pitting edema  Neurological: She is alert and oriented to person, place, and time.  Skin: Skin is warm and dry.    ED Course  Procedures (including critical care time) Labs Review Labs Reviewed  TROPONIN I  PRO B NATRIURETIC PEPTIDE  CBC  BASIC METABOLIC PANEL   Imaging Review Dg Chest Port 1 View  08/08/2013   *RADIOLOGY REPORT*  Clinical Data: Chest pain.  PORTABLE CHEST - 1 VIEW  Comparison: None.  Findings: Single view of the  chest demonstrates  a retrocardiac density that may represent a large hiatal hernia. CT report from 02/07/2003 described a small-moderate sized hiatal hernia. Otherwise, the lungs are clear.  Slightly prominent vascular structures without pulmonary edema.  Bony thorax is intact.  IMPRESSION: Retrocardiac density likely represents a hiatal hernia based on previous CT report.  No acute chest findings.   Original Report Authenticated By: Richarda Overlie, M.D.    MDM  No diagnosis found.   Date: 08/08/2013  Rate: 77  Rhythm: normal sinus rhythm  QRS Axis: normal  Intervals: QT prolonged  ST/T Wave abnormalities: nonspecific ST/T changes  Conduction Disutrbances:none  Narrative Interpretation:   Old EKG Reviewed: unchanged  Pt comes in with cc of chest pain and dib.  Differential diagnosis includes: ACS syndrome CHF exacerbation Valvular disorder Myocarditis Pericarditis Pericardial effusion Pneumonia Pleural effusion Pulmonary edema PE Anemia Musculoskeletal pain  Cardiac risk factors include HTN and age. She has no echo in our system, but comes in with 3+ pitting edema. EKG shows nothing acite, mildly low voltage. We have ordered cardiac labs, CXR.  Pt is morbidly obese, relatively sedentary - so PE also considered, but given the 3+ pitting edema, pretest probability for CHF or right sided failure, or valvular pathology higher.  Will likely need admission.    Derwood Kaplan, MD 08/08/13 1819  9:09 PM Pt's CXR and BNP are not very impressive. Spoke with Dr. Josephine Igo, [facility], and also patient - and it appears that her Dr. Timothy Lasso already has scheduled an echo and Cards follow up in September. Plan is to give her a dose of lasix here, and she will take double dose tomorrow - and PCP team to call her with follow up plans. Since holiday weekend, patient and family advised to return to the ER if the symptoms are getting worse.   Derwood Kaplan, MD 08/08/13 2110

## 2013-08-14 ENCOUNTER — Other Ambulatory Visit (HOSPITAL_COMMUNITY): Payer: Medicare Other

## 2013-08-15 ENCOUNTER — Other Ambulatory Visit (HOSPITAL_COMMUNITY): Payer: Medicare Other

## 2013-08-15 ENCOUNTER — Other Ambulatory Visit (HOSPITAL_COMMUNITY): Payer: Self-pay | Admitting: Internal Medicine

## 2013-08-15 DIAGNOSIS — R609 Edema, unspecified: Secondary | ICD-10-CM

## 2013-08-21 ENCOUNTER — Other Ambulatory Visit (HOSPITAL_COMMUNITY): Payer: Self-pay | Admitting: Internal Medicine

## 2013-08-21 DIAGNOSIS — R609 Edema, unspecified: Secondary | ICD-10-CM

## 2013-08-22 ENCOUNTER — Ambulatory Visit (HOSPITAL_COMMUNITY): Payer: Medicare Other | Attending: Internal Medicine

## 2013-08-22 DIAGNOSIS — R079 Chest pain, unspecified: Secondary | ICD-10-CM | POA: Insufficient documentation

## 2013-08-22 DIAGNOSIS — I509 Heart failure, unspecified: Secondary | ICD-10-CM | POA: Insufficient documentation

## 2013-08-22 DIAGNOSIS — R0609 Other forms of dyspnea: Secondary | ICD-10-CM | POA: Insufficient documentation

## 2013-08-22 DIAGNOSIS — I10 Essential (primary) hypertension: Secondary | ICD-10-CM | POA: Insufficient documentation

## 2013-08-22 DIAGNOSIS — R0989 Other specified symptoms and signs involving the circulatory and respiratory systems: Secondary | ICD-10-CM | POA: Insufficient documentation

## 2013-08-22 DIAGNOSIS — R609 Edema, unspecified: Secondary | ICD-10-CM | POA: Insufficient documentation

## 2013-08-22 NOTE — Progress Notes (Signed)
Echocardiogram performed.  

## 2013-09-12 ENCOUNTER — Other Ambulatory Visit (HOSPITAL_COMMUNITY): Payer: Self-pay | Admitting: Internal Medicine

## 2013-09-12 DIAGNOSIS — R188 Other ascites: Secondary | ICD-10-CM

## 2013-09-13 ENCOUNTER — Ambulatory Visit (HOSPITAL_COMMUNITY)
Admission: RE | Admit: 2013-09-13 | Discharge: 2013-09-13 | Disposition: A | Discharge status: Home / Self Care | Payer: Medicare Other | Source: Ambulatory Visit | Attending: Internal Medicine | Admitting: Internal Medicine

## 2013-09-13 DIAGNOSIS — R188 Other ascites: Secondary | ICD-10-CM | POA: Insufficient documentation

## 2013-09-13 HISTORY — PX: PARACENTESIS: SHX844

## 2013-09-13 LAB — BODY FLUID CELL COUNT WITH DIFFERENTIAL

## 2013-09-13 NOTE — Procedures (Signed)
Successful US guided paracentesis from LLQ.  Yielded 3.5 liters of clear yellow fluid.  No immediate complications.  Pt tolerated well.   Specimen was sent for labs.  Pattricia Boss D PA-C 09/13/2013 2:38 PM

## 2013-09-16 ENCOUNTER — Telehealth (HOSPITAL_COMMUNITY): Payer: Self-pay

## 2013-09-17 LAB — BODY FLUID CULTURE

## 2013-09-19 ENCOUNTER — Emergency Department (HOSPITAL_COMMUNITY): Payer: Medicare Other

## 2013-09-19 ENCOUNTER — Inpatient Hospital Stay (HOSPITAL_COMMUNITY)
Admission: EM | Admit: 2013-09-19 | Discharge: 2013-09-25 | DRG: 433 | Disposition: A | Discharge status: Nursing Home (Includes ICF) | Payer: Medicare Other | Attending: Internal Medicine | Admitting: Internal Medicine

## 2013-09-19 ENCOUNTER — Encounter (HOSPITAL_COMMUNITY): Payer: Self-pay | Admitting: Emergency Medicine

## 2013-09-19 DIAGNOSIS — Z8673 Personal history of transient ischemic attack (TIA), and cerebral infarction without residual deficits: Secondary | ICD-10-CM

## 2013-09-19 DIAGNOSIS — H8110 Benign paroxysmal vertigo, unspecified ear: Secondary | ICD-10-CM | POA: Diagnosis present

## 2013-09-19 DIAGNOSIS — Z6841 Body Mass Index (BMI) 40.0 and over, adult: Secondary | ICD-10-CM

## 2013-09-19 DIAGNOSIS — F411 Generalized anxiety disorder: Secondary | ICD-10-CM | POA: Diagnosis present

## 2013-09-19 DIAGNOSIS — Z7982 Long term (current) use of aspirin: Secondary | ICD-10-CM

## 2013-09-19 DIAGNOSIS — E46 Unspecified protein-calorie malnutrition: Secondary | ICD-10-CM | POA: Diagnosis present

## 2013-09-19 DIAGNOSIS — R0602 Shortness of breath: Secondary | ICD-10-CM

## 2013-09-19 DIAGNOSIS — F319 Bipolar disorder, unspecified: Secondary | ICD-10-CM | POA: Diagnosis present

## 2013-09-19 DIAGNOSIS — D72819 Decreased white blood cell count, unspecified: Secondary | ICD-10-CM | POA: Diagnosis present

## 2013-09-19 DIAGNOSIS — R188 Other ascites: Secondary | ICD-10-CM | POA: Diagnosis present

## 2013-09-19 DIAGNOSIS — E6609 Other obesity due to excess calories: Secondary | ICD-10-CM | POA: Diagnosis present

## 2013-09-19 DIAGNOSIS — D649 Anemia, unspecified: Secondary | ICD-10-CM | POA: Diagnosis present

## 2013-09-19 DIAGNOSIS — E876 Hypokalemia: Secondary | ICD-10-CM

## 2013-09-19 DIAGNOSIS — M1990 Unspecified osteoarthritis, unspecified site: Secondary | ICD-10-CM | POA: Diagnosis present

## 2013-09-19 DIAGNOSIS — I10 Essential (primary) hypertension: Secondary | ICD-10-CM | POA: Diagnosis present

## 2013-09-19 DIAGNOSIS — Z79899 Other long term (current) drug therapy: Secondary | ICD-10-CM

## 2013-09-19 DIAGNOSIS — J189 Pneumonia, unspecified organism: Secondary | ICD-10-CM

## 2013-09-19 DIAGNOSIS — R627 Adult failure to thrive: Secondary | ICD-10-CM | POA: Diagnosis present

## 2013-09-19 DIAGNOSIS — K7460 Unspecified cirrhosis of liver: Principal | ICD-10-CM | POA: Diagnosis present

## 2013-09-19 DIAGNOSIS — K449 Diaphragmatic hernia without obstruction or gangrene: Secondary | ICD-10-CM | POA: Diagnosis present

## 2013-09-19 DIAGNOSIS — K219 Gastro-esophageal reflux disease without esophagitis: Secondary | ICD-10-CM | POA: Diagnosis present

## 2013-09-19 DIAGNOSIS — K5900 Constipation, unspecified: Secondary | ICD-10-CM | POA: Diagnosis present

## 2013-09-19 DIAGNOSIS — D696 Thrombocytopenia, unspecified: Secondary | ICD-10-CM | POA: Diagnosis present

## 2013-09-19 DIAGNOSIS — E662 Morbid (severe) obesity with alveolar hypoventilation: Secondary | ICD-10-CM | POA: Diagnosis present

## 2013-09-19 HISTORY — DX: Chronic or unspecified gastric ulcer with hemorrhage: K25.4

## 2013-09-19 HISTORY — DX: Major depressive disorder, single episode, unspecified: F32.9

## 2013-09-19 HISTORY — DX: Cerebral infarction, unspecified: I63.9

## 2013-09-19 HISTORY — DX: Personal history of other medical treatment: Z92.89

## 2013-09-19 HISTORY — DX: Angina pectoris, unspecified: I20.9

## 2013-09-19 HISTORY — DX: Unspecified osteoarthritis, unspecified site: M19.90

## 2013-09-19 HISTORY — DX: Depression, unspecified: F32.A

## 2013-09-19 HISTORY — DX: Anxiety disorder, unspecified: F41.9

## 2013-09-19 HISTORY — DX: Fatty (change of) liver, not elsewhere classified: K76.0

## 2013-09-19 HISTORY — DX: Shortness of breath: R06.02

## 2013-09-19 HISTORY — DX: Gastro-esophageal reflux disease without esophagitis: K21.9

## 2013-09-19 HISTORY — DX: Other ascites: R18.8

## 2013-09-19 HISTORY — DX: Anemia, unspecified: D64.9

## 2013-09-19 HISTORY — DX: Unspecified cirrhosis of liver: K74.60

## 2013-09-19 LAB — COMPREHENSIVE METABOLIC PANEL
ALT: 26 U/L (ref 0–35)
Alkaline Phosphatase: 83 U/L (ref 39–117)
BUN: 10 mg/dL (ref 6–23)
Chloride: 102 mEq/L (ref 96–112)
GFR calc Af Amer: >90 mL/min (ref >90)
Glucose, Bld: 90 mg/dL (ref 70–99)
Potassium: 3.1 mEq/L — ABNORMAL LOW (ref 3.5–5.1)
Sodium: 142 mEq/L (ref 135–145)
Total Bilirubin: 1.1 mg/dL (ref 0.3–1.2)

## 2013-09-19 LAB — MRSA PCR SCREENING: MRSA by PCR: NEGATIVE

## 2013-09-19 LAB — AMMONIA: Ammonia: 44 umol/L (ref 11–60)

## 2013-09-19 LAB — POCT I-STAT TROPONIN I: Troponin i, poc: 0 ng/mL (ref 0.00–0.08)

## 2013-09-19 LAB — CBC
MCHC: 32.5 g/dL (ref 30.0–36.0)
MCV: 87.4 fL (ref 78.0–100.0)
Platelets: 116 10*3/uL — ABNORMAL LOW (ref 150–400)
RDW: 18.6 % — ABNORMAL HIGH (ref 11.5–15.5)
WBC: 3.8 10*3/uL — ABNORMAL LOW (ref 4.0–10.5)

## 2013-09-19 LAB — PRO B NATRIURETIC PEPTIDE: Pro B Natriuretic peptide (BNP): 83.5 pg/mL (ref 0–125)

## 2013-09-19 MED ORDER — MELATONIN 3 MG PO CAPS: 1.0000 | ORAL_CAPSULE | Freq: Every day | ORAL | Status: DC

## 2013-09-19 MED ORDER — ONDANSETRON HCL 4 MG PO TABS: 4.0000 mg | ORAL_TABLET | Freq: Four times a day (QID) | ORAL | Status: DC | PRN

## 2013-09-19 MED ORDER — ACETAMINOPHEN 325 MG PO TABS
650.0000 mg | ORAL_TABLET | Freq: Two times a day (BID) | ORAL | Status: DC | PRN
  Administered 2013-09-20 – 2013-09-24 (×6): 650 mg via ORAL
  Filled 2013-09-19 (×6): qty 2

## 2013-09-19 MED ORDER — ACETAMINOPHEN 650 MG RE SUPP: 650.0000 mg | Freq: Two times a day (BID) | RECTAL | Status: DC | PRN

## 2013-09-19 MED ORDER — NADOLOL 20 MG PO TABS
20.0000 mg | ORAL_TABLET | Freq: Every day | ORAL | Status: DC
  Administered 2013-09-20 – 2013-09-25 (×6): 20 mg via ORAL
  Filled 2013-09-19 (×6): qty 1

## 2013-09-19 MED ORDER — POTASSIUM CHLORIDE CRYS ER 20 MEQ PO TBCR
40.0000 meq | EXTENDED_RELEASE_TABLET | Freq: Once | ORAL | Status: AC
  Administered 2013-09-19: 40 meq via ORAL
  Filled 2013-09-19: qty 2

## 2013-09-19 MED ORDER — ONDANSETRON HCL 4 MG/2ML IJ SOLN
4.0000 mg | Freq: Four times a day (QID) | INTRAMUSCULAR | Status: DC | PRN
  Administered 2013-09-23: 4 mg via INTRAVENOUS
  Filled 2013-09-19: qty 2

## 2013-09-19 MED ORDER — FLUOXETINE HCL 40 MG PO CAPS: 40.0000 mg | ORAL_CAPSULE | Freq: Two times a day (BID) | ORAL | Status: DC

## 2013-09-19 MED ORDER — PANTOPRAZOLE SODIUM 40 MG PO TBEC
40.0000 mg | DELAYED_RELEASE_TABLET | Freq: Two times a day (BID) | ORAL | Status: DC
  Administered 2013-09-19 – 2013-09-25 (×12): 40 mg via ORAL
  Filled 2013-09-19 (×11): qty 1

## 2013-09-19 MED ORDER — METOCLOPRAMIDE HCL 5 MG PO TABS
5.0000 mg | ORAL_TABLET | Freq: Two times a day (BID) | ORAL | Status: DC
  Administered 2013-09-19 – 2013-09-25 (×12): 5 mg via ORAL
  Filled 2013-09-19 (×13): qty 1

## 2013-09-19 MED ORDER — SODIUM CHLORIDE 0.9 % IJ SOLN
3.0000 mL | Freq: Two times a day (BID) | INTRAMUSCULAR | Status: DC
  Administered 2013-09-19 – 2013-09-25 (×12): 3 mL via INTRAVENOUS

## 2013-09-19 MED ORDER — VANCOMYCIN HCL 10 G IV SOLR
2500.0000 mg | Freq: Once | INTRAVENOUS | Status: AC
  Administered 2013-09-19: 2500 mg via INTRAVENOUS
  Filled 2013-09-19: qty 2500

## 2013-09-19 MED ORDER — DEXTROSE 5 % IV SOLN
1.0000 g | Freq: Once | INTRAVENOUS | Status: AC
  Administered 2013-09-19: 1 g via INTRAVENOUS
  Filled 2013-09-19: qty 1

## 2013-09-19 MED ORDER — DOXEPIN HCL 75 MG PO CAPS
75.0000 mg | ORAL_CAPSULE | Freq: Every day | ORAL | Status: DC
  Administered 2013-09-19 – 2013-09-24 (×6): 75 mg via ORAL
  Filled 2013-09-19 (×7): qty 1

## 2013-09-19 MED ORDER — DOCUSATE SODIUM 100 MG PO CAPS
100.0000 mg | ORAL_CAPSULE | Freq: Every day | ORAL | Status: DC | PRN
  Filled 2013-09-19: qty 1

## 2013-09-19 MED ORDER — NITROGLYCERIN 0.4 MG SL SUBL: 0.4000 mg | SUBLINGUAL_TABLET | SUBLINGUAL | Status: DC | PRN

## 2013-09-19 MED ORDER — LORAZEPAM 0.5 MG PO TABS
0.5000 mg | ORAL_TABLET | Freq: Two times a day (BID) | ORAL | Status: DC | PRN
  Administered 2013-09-20 – 2013-09-24 (×3): 0.5 mg via ORAL
  Filled 2013-09-19 (×3): qty 1

## 2013-09-19 MED ORDER — SODIUM CHLORIDE 0.9 % IJ SOLN
3.0000 mL | Freq: Two times a day (BID) | INTRAMUSCULAR | Status: DC
  Administered 2013-09-19 – 2013-09-23 (×6): 3 mL via INTRAVENOUS

## 2013-09-19 MED ORDER — FLUOXETINE HCL 20 MG PO CAPS
40.0000 mg | ORAL_CAPSULE | Freq: Two times a day (BID) | ORAL | Status: DC
  Administered 2013-09-19 – 2013-09-25 (×12): 40 mg via ORAL
  Filled 2013-09-19 (×13): qty 2

## 2013-09-19 MED ORDER — ASPIRIN EC 81 MG PO TBEC
81.0000 mg | DELAYED_RELEASE_TABLET | Freq: Every day | ORAL | Status: DC
  Administered 2013-09-20 – 2013-09-25 (×6): 81 mg via ORAL
  Filled 2013-09-19 (×6): qty 1

## 2013-09-19 MED ORDER — POTASSIUM CHLORIDE CRYS ER 20 MEQ PO TBCR
40.0000 meq | EXTENDED_RELEASE_TABLET | Freq: Two times a day (BID) | ORAL | Status: DC
  Administered 2013-09-19 – 2013-09-25 (×12): 40 meq via ORAL
  Filled 2013-09-19 (×14): qty 2

## 2013-09-19 MED ORDER — VANCOMYCIN HCL 10 G IV SOLR
1250.0000 mg | Freq: Two times a day (BID) | INTRAVENOUS | Status: DC
  Filled 2013-09-19: qty 1250

## 2013-09-19 MED ORDER — IOHEXOL 350 MG/ML SOLN
100.0000 mL | Freq: Once | INTRAVENOUS | Status: AC | PRN
  Administered 2013-09-19: 100 mL via INTRAVENOUS

## 2013-09-19 MED ORDER — FUROSEMIDE 10 MG/ML IJ SOLN
60.0000 mg | Freq: Two times a day (BID) | INTRAMUSCULAR | Status: DC
  Administered 2013-09-19 – 2013-09-22 (×7): 60 mg via INTRAVENOUS
  Filled 2013-09-19 (×9): qty 6

## 2013-09-19 MED ORDER — LACTULOSE 10 GM/15ML PO SOLN
10.0000 g | Freq: Every day | ORAL | Status: DC | PRN
  Administered 2013-09-21: 10 g via ORAL
  Filled 2013-09-19 (×2): qty 15

## 2013-09-19 MED ORDER — SPIRONOLACTONE 12.5 MG HALF TABLET
12.5000 mg | ORAL_TABLET | Freq: Every day | ORAL | Status: DC
  Administered 2013-09-19 – 2013-09-22 (×4): 12.5 mg via ORAL
  Filled 2013-09-19 (×5): qty 1

## 2013-09-19 MED ORDER — SODIUM CHLORIDE 0.9 % IJ SOLN: 3.0000 mL | INTRAMUSCULAR | Status: DC | PRN

## 2013-09-19 MED ORDER — SODIUM CHLORIDE 0.9 % IV SOLN
250.0000 mL | INTRAVENOUS | Status: DC | PRN
Start: 2013-09-19 — End: 2013-09-25

## 2013-09-19 NOTE — H&P (Signed)
[name] is an 67 y.o. female.   PCP:   Gwen Pounds, MD   Chief Complaint:  Volume Overload, Ascites, SOB  HPI: 72 F pt c known Cirrhosis from NASH S/p recent 3.5 L Paracentesis (-) Cx and (-) Cytology and also maintained on chronic Diuretic therapy.  Recent ECHO showed EF 55-65%, Mild LVH, Mild AS.  She resides AL due to AFTT.  The AL called this am with worsening LE Edema, Weaping, tense abdomen, Increasing SOB.  We sent her to the ED. In ED CXR  C/w Cardiac enlargement with vascular congestion but no pulmonary edema. Focal left lower lobe consolidation suspected although this area is poorly defined.  EDP suspected HCAP and Multiple Abx given despite no Fever or WBC or cough or sputum.  Ab US showed Large volume of ascites is present. This is identified in all 4 quadrants.  I was called for inpt admission.  D dimer was high so CTPA ordered - I suspect no PNA but ATX due to pressure from the Volume.  Will need to get GI involved to help me treat her.  09/06/13 ! Ammonia, Plasma      [H]  95 ug/dL                    16-10  CTPA reviewed.  Pt looks good and is not hupoxic. No clinical PNA. All Volume issues     Past Medical History:  Past Medical History  Diagnosis Date  . Exogenous obesity   . Hypertension   . Anxiety and depression     chronic  . Hiatal hernia   . Osteoarthritis   . Atypical chest pain 06/10/03    normal 2 day adenosine cardiolite  . Memory changes   . Bipolar disorder   . Ascites   . Cirrhosis    Obesity, HTN, Fatty Liver based on Elevated LFTs and US findings 4/07,  Osteoarthritis - Dr Collins/Dr Aluisio, IBS,  hemorrhoids, GERD/Large hiatal hernia w/gastritis - Last EGD 03/05/10, Gastroparesis  Dr Madilyn Fireman, allergic rhinitis.  Chronic fatigueFibromyalgia/ H/O PMR per Dr Corliss Skains.  Depression/Bipolar.  Insomnia.  Disabled.  Lactose intol CVA Subacute seen on 04/2013 CCT Cirrhosis/Ascites.   Past Surgical History  Procedure Laterality Date  . Tubal  ligation  1977  . Other surgical history  1980    hysterectomy  . Cardiac catheterization  1990    normal cath  . Esophagogastroduodenoscopy N/A 04/22/2013    Procedure: ESOPHAGOGASTRODUODENOSCOPY (EGD);  Surgeon: Shirley Friar, MD;  Location: [facility] ENDOSCOPY;  Service: Endoscopy;  Laterality: N/A;    Right carpel tunnel release (2007), total abdominal hysterectomy (1979), tubal ligation (9604), ACL repairs (1992), R & L Knee reconstruction 1996  Allergies:   Allergies  Allergen Reactions  . Codeine     itching  . Sulfa Antibiotics     Pull my hair out     Medications: Prior to Admission medications   Medication Sig Start Date End Date Taking? Authorizing Provider  acetaminophen (TYLENOL) 325 MG tablet Take 650 mg by mouth every 6 (six) hours as needed for pain.   Yes Historical Provider, MD  aspirin EC 81 MG tablet Take 81 mg by mouth daily.   Yes Historical Provider, MD  diltiazem (CARDIZEM) 60 MG tablet Take 60 mg by mouth 2 (two) times daily.   Yes Historical Provider, MD  docusate sodium (COLACE) 100 MG capsule Take 100 mg by mouth daily as needed for constipation.   Yes Historical Provider,  MD  doxepin (SINEQUAN) 75 MG capsule Take 75 mg by mouth at bedtime.    Yes Historical Provider, MD  FLUoxetine (PROZAC) 40 MG capsule Take 40 mg by mouth 2 (two) times daily. Per Dr.Pulis   Yes Historical Provider, MD  furosemide (LASIX) 40 MG tablet Take 60 mg by mouth 2 (two) times daily.    Yes Historical Provider, MD  LORazepam (ATIVAN) 0.5 MG tablet Take 0.5 mg by mouth 2 (two) times daily as needed for anxiety.   Yes Historical Provider, MD  Melatonin 3 MG CAPS Take 1 capsule by mouth at bedtime.    Yes Historical Provider, MD  metoCLOPramide (REGLAN) 5 MG tablet Take 5 mg by mouth 2 (two) times daily.   Yes Historical Provider, MD  Multiple Vitamins-Minerals (CERTA-VITE PO) Take 1 tablet by mouth daily.   Yes Historical Provider, MD  nadolol (CORGARD) 20 MG tablet Take 20 mg by  mouth daily.   Yes Historical Provider, MD  nitroGLYCERIN (NITROSTAT) 0.4 MG SL tablet Place 0.4 mg under the tongue every 5 (five) minutes as needed for chest pain.   Yes Historical Provider, MD  pantoprazole (PROTONIX) 40 MG tablet Take 40 mg by mouth 2 (two) times daily.   Yes Historical Provider, MD  polyethylene glycol (MIRALAX / GLYCOLAX) packet Take 17 g by mouth daily as needed (constipation).   Yes Historical Provider, MD  albuterol (PROVENTIL) (2.5 MG/3ML) 0.083% nebulizer solution Take 2.5 mg by nebulization 4 (four) times daily as needed for wheezing.    Historical Provider, MD   Complete Medication List: 1)  Zofran 4 Mg Tabs (Ondansetron hcl) .Marland Kitchen.. 1 q6h prn 2)  Certavite/antioxidants Tabs (Multiple vitamins-minerals) .Marland Kitchen.. 1 qd 3)  Colace 100 Mg Caps (Docusate sodium) .... One po daily prn constipation 4)  Nitrostat 0.4 Mg Subl (Nitroglycerin) .... Take 1 tab sublinguly q as needed for chest pain 5)  Furosemide 40 Mg Tabs (Furosemide) .... One and a half = 60mg  po daily 6)  Reglan 5 Mg Tabs (Metoclopramide hcl) .... One po bid c meals (breakfast and dinner) 7)  Albuterol Sulfate (2.5 Mg/22ml) 0.083% Nebu (Albuterol sulfate) .... Q 8 hours prn 8)  Miralax Powd (Polyethylene glycol 3350) .Marland Kitchen.. 17gm qd prn constipation 9)  Melatonin 3 Mg Tabs (Melatonin) .Marland Kitchen.. 1 po qhs 10)  Pantoprazole Sodium 40 Mg Tbec (Pantoprazole sodium) .Marland Kitchen.. 1 po bid 11)  Aspirin 81 Mg Ec Tab (Aspirin) .... Take one (1) tablet by mouth daily 12)  Prozac 40 Mg Caps (Fluoxetine hcl) .... Take (2) tablet by mouth every day 13)  Diltiazem Hcl 60 Mg Tabs (Diltiazem hcl) .... Take one tablet by mouth twice daily 14)  Nadolol 20 Mg Tabs (Nadolol) .Marland Kitchen.. 1 po qd 15)  Ativan 0.5 Mg Tabs (Lorazepam) .... 1/2 po q 12 hours prn 16)  Doxepin Hcl 75 Mg Caps (Doxepin hcl) .Marland Kitchen.. 1 qhs 17)  Prenatal Formula Tabs (Prenatal vit-fe fumarate-fa tabs) .Marland Kitchen.. 1 po qd      (Not in a hospital admission)   Social History:   reports that she has never smoked. She has never used smokeless tobacco. She reports that she does not drink alcohol or use illicit drugs.  Amabel is widowed with 3 children 4 children.  Disability. AL  Family History: Family History  Problem Relation Age of Onset  . Heart attack Mother   . Heart disease Mother   . Heart disease Father   . Heart attack Father   Father and mother are both  deceased.  Significant for anxiety, osteoarthritis, CAD, hypertension, alzheimers, myocardial infarction, allergies, COPD.  She has a sister with hypertension.  Review of Systems:  Review of Systems - +SOB, + Ededma, + Ascites, + Weight gain. All ROS obtained. No Cp No bowel or bladder issues x goes often. Mentally stable   Physical Exam:  Blood pressure 113/67, pulse 73, temperature 97.9 F (36.6 C), temperature source Oral, resp. rate 11, SpO2 96.00%. Filed Vitals:   09/19/13 1311 09/19/13 1345 09/19/13 1615 09/19/13 1700  BP: 104/72 136/48 102/70 113/67  Pulse: 73 73 73 73  Temp: 97.9 F (36.6 C)     TempSrc: Oral     Resp: 18 13 11 11   SpO2: 97% 97% 96% 96%   General appearance: Obese, swollen, NAD Head: Normocephalic, without obvious abnormality, atraumatic Eyes: conjunctivae/corneas clear. PERRL, EOM's intact.  Nose: Nares normal. Septum midline. Mucosa normal. No drainage or sinus tenderness. Throat: lips, mucosa, and tongue normal; teeth and gums normal Neck: no adenopathy, no carotid bruit, no JVD and thyroid not enlarged, symmetric, no tenderness/mass/nodules Resp: Distant, Clear Cardio: reg, PVCs on monitor, No m GI: Tense.  No Pain.  + Fluid wave Extremities: 2-3 + Edema thighs.  LE Wrapped. Pulses: 2+ and symmetric Lymph nodes:no cervical lymphadenopathy Neurologic: Alert and oriented . Gen Weakness no focal.    Labs on Admission:   Recent Labs  09/19/13 1337  NA 142  K 3.1*  CL 102  CO2 31  GLUCOSE 90  BUN 10  CREATININE 0.74  CALCIUM 8.6    Recent Labs   09/19/13 1337  AST 76*  ALT 26  ALKPHOS 83  BILITOT 1.1  PROT 6.8  ALBUMIN 2.3*   No results found for this basename: LIPASE, AMYLASE,  in the last 72 hours  Recent Labs  09/19/13 1337  WBC 3.8*  HGB 11.5*  HCT 35.4*  MCV 87.4  PLT 116*   No results found for this basename: CKTOTAL, CKMB, CKMBINDEX, TROPONINI,  in the last 72 hours No results found for this basename: INR,  PROTIME     LAB RESULT POCT:  Results for orders placed during the hospital encounter of 09/19/13  COMPREHENSIVE METABOLIC PANEL      Result Value Range   Sodium 142  135 - 145 mEq/L   Potassium 3.1 (*) 3.5 - 5.1 mEq/L   Chloride 102  96 - 112 mEq/L   CO2 31  19 - 32 mEq/L   Glucose, Bld 90  70 - 99 mg/dL   BUN 10  6 - 23 mg/dL   Creatinine, Ser 4.54  0.50 - 1.10 mg/dL   Calcium 8.6  8.4 - 09.8 mg/dL   Total Protein 6.8  6.0 - 8.3 g/dL   Albumin 2.3 (*) 3.5 - 5.2 g/dL   AST 76 (*) 0 - 37 U/L   ALT 26  0 - 35 U/L   Alkaline Phosphatase 83  39 - 117 U/L   Total Bilirubin 1.1  0.3 - 1.2 mg/dL   GFR calc non Af Amer 86 (*) >90 mL/min   GFR calc Af Amer >90  >90 mL/min  CBC      Result Value Range   WBC 3.8 (*) 4.0 - 10.5 K/uL   RBC 4.05  3.87 - 5.11 MIL/uL   Hemoglobin 11.5 (*) 12.0 - 15.0 g/dL   HCT 11.9 (*) 14.7 - 82.9 %   MCV 87.4  78.0 - 100.0 fL   MCH 28.4  26.0 - 34.0  pg   MCHC 32.5  30.0 - 36.0 g/dL   RDW 16.1 (*) 09.6 - 04.5 %   Platelets 116 (*) 150 - 400 K/uL  D-DIMER, QUANTITATIVE      Result Value Range   D-Dimer, Quant 2.98 (*) 0.00 - 0.48 ug/mL-FEU  PRO B NATRIURETIC PEPTIDE      Result Value Range   Pro B Natriuretic peptide (BNP) 83.5  0 - 125 pg/mL  POCT I-STAT TROPONIN I      Result Value Range   Troponin i, poc 0.00  0.00 - 0.08 ng/mL   Comment 3               Radiological Exams on Admission: Dg Chest 2 View  09/19/2013   CLINICAL DATA:  Shortness of breath, hypertension  EXAM: CHEST  2 VIEW  COMPARISON:  08/08/2013  FINDINGS: Moderate cardiac enlargement. Mild  aortic arch calcifications. Mild vascular congestion with no evidence of pulmonary edema or consolidation. Lateral view of limited diagnostic utility due to patient body habitus. Some degree of left lower lobe retrocardiac consolidation is suspected.  IMPRESSION: Limited study. Cardiac enlargement with vascular congestion but no pulmonary edema. Focal left lower lobe consolidation suspected although this area is poorly defined.   Electronically Signed   By: Esperanza Heir M.D.   On: 09/19/2013 14:18   Ct Angio Chest W/cm &/or Wo Cm  09/19/2013   CLINICAL DATA:  Shortness of breath.  EXAM: CT ANGIOGRAPHY CHEST WITH CONTRAST  TECHNIQUE: Multidetector CT imaging of the chest was performed using the standard protocol during bolus administration of intravenous contrast. Multiplanar CT image reconstructions including MIPs were obtained to evaluate the vascular anatomy.  CONTRAST:  OMNIPAQUE IOHEXOL 350 MG/ML SOLN  COMPARISON:  Chest radiograph 09/19/2013  FINDINGS: This is a technically good evaluation of the pulmonary arterial tree. No focal filling defects are identified in the pulmonary arteries to suggest pulmonary embolism. Evaluation of the subsegmental pulmonary artery branches in the left lower lobe is somewhat limited by collapse/consolidation of that portion of the left lower lobe.  The heart is enlarged.  There is a small left pleural effusion.  There is a moderate-sized hiatal hernia. The wall of the herniated portion of the stomach appears thickened, but it could be accentuated by decompression.  The thoracic aorta is normal in caliber and enhancement.  No lymphadenopathy is detected in the thorax. Lung windows demonstrate collapse/ consolidation of the basilar portion of the left lower lobe. No pulmonary nodule or mass is detected. There is mild atelectasis in the right lower lobe.  The bones appear osteopenic. No acute bony abnormality is identified.  Upper abdomen: A moderate amount of abdominal  ascites is seen in the upper abdomen. The liver contour appears nodular, consistent with hepatic cirrhosis. The spleen is not entirely visualized. The imaged portion appears within normal limits.  Review of the MIP images confirms the above findings.  IMPRESSION: 1. Collapse and/or consolidation of the basilar portion of the left lower lobe. Airspace disease related to infection or aspiration cannot be excluded in the left lower lobe.  2. Small left pleural effusion.  3. Moderate hiatal hernia. The herniated portion of the stomach appears to have a thickened wall, for which gastritis or malignancy cannot be excluded.  4. Hepatic cirrhosis and abdominal ascites  5. No evidence of pulmonary embolism  6. Cardiomegaly   Electronically Signed   By: Britta Mccreedy M.D.   On: 09/19/2013 18:00   US Abdomen Limited  09/19/2013   CLINICAL DATA:  Abdominal distention. Ascites.  EXAM: US ABDOMEN LIMITED  COMPARISON:  09/13/2013.  FINDINGS: Large volume of ascites is present. This is identified in all 4 quadrants.  IMPRESSION:  Large volume ascites.   Electronically Signed   By: Andreas Newport M.D.   On: 09/19/2013 15:01      Orders placed during the hospital encounter of 09/19/13  . EKG 12-LEAD  . EKG 12-LEAD     Assessment/Plan Principal Problem:   Ascites Active Problems:   Exogenous obesity   Cirrhosis   Shortness of breath   Progressive Vol Overload in pt with Cirrhosis.  Cirrhosis from NASH. S/P recent Paracentesis.  Will Admit.  Aggressive Diurese.  Paracentesis tomorrow.  Get Dr Madilyn Fireman to consult.  Pt already on BB and Lasix as outpt.  Re-check Ammonia and consider start of lactulose.  Albumin Low. AST elevated.  Check PT/INR.  Protein malnutrition - Will get Nutrition to help. Encouraged protein consumption. Boost/Ensure.  SOB from Vol Overload. No Clinical PNA.  Discussed c pt and daughter - hold Abx for now.  Recent ECHO was better than expected.  No Pulm HTN noted although it may have been  present as Obesity Hypoventilation certainly may be playing a role.  Sats are 97% RA, No Fever, nno wbc - Infection is low likely.    Adult failure to thrive - I think she will stay AL forever. PT/OT/CW ordered  HTN - BP is fine  Constipation -consider change to Lactulose.  Check Ammonia  B LE Edema - less where she is wrapped.  Nausea - prn Zofran 4 Mg prn  DVT proph c squeezers until post Procedure.  Replete K  Numa Schroeter M 09/19/2013, 6:08 PM

## 2013-09-19 NOTE — Progress Notes (Signed)
ANTIBIOTIC CONSULT NOTE - INITIAL  Pharmacy Consult for vancomycin  Indication: rule out pneumonia  Allergies  Allergen Reactions  . Codeine     itching  . Sulfa Antibiotics     Pull my hair out   Vital Signs: Temp: 97.9 F (36.6 C) (10/09 1311) Temp src: Oral (10/09 1311) BP: 104/72 mmHg (10/09 1311) Pulse Rate: 73 (10/09 1311) Intake/Output from previous day:   Intake/Output from this shift:    Labs: No results found for this basename: WBC, HGB, PLT, LABCREA, CREATININE,  in the last 72 hours The CrCl is unknown because both a height and weight (above a minimum accepted value) are required for this calculation. No results found for this basename: VANCOTROUGH, Leodis Binet, VANCORANDOM, GENTTROUGH, GENTPEAK, GENTRANDOM, TOBRATROUGH, TOBRAPEAK, TOBRARND, AMIKACINPEAK, AMIKACINTROU, AMIKACIN,  in the last 72 hours   Microbiology: Recent Results (from the past 720 hour(s))  BODY FLUID CULTURE     Status: None   Collection Time    09/13/13  1:54 PM      Result Value Range Status   Specimen Description ASCITIC ABDOMEN FLUID   Final   Special Requests FLUID   Final   Gram Stain     Final   Value: NO WBC SEEN     NO ORGANISMS SEEN     Performed at [facility]   Culture     Final   Value: NO GROWTH 3 DAYS     Performed at [facility]   Report Status 09/17/2013 FINAL   Final    Medical History: Past Medical History  Diagnosis Date  . Exogenous obesity   . Hypertension   . Anxiety and depression     chronic  . Hiatal hernia   . Osteoarthritis   . Atypical chest pain 06/10/03    normal 2 day adenosine cardiolite  . Memory changes   . Bipolar disorder    Assessment: 67 year old female with known cirrhosis from nash with ascites presents to [facility] with shortness of breath. Empiric antibiotics started for possible pneumonia. No fevers noted, wbc low at 3.8, renal function is normal.  Goal of Therapy:  Vancomycin trough level 15-20 mcg/ml  Plan:   Measure antibiotic drug levels at steady state Follow up culture results Vancomycin 2.5g IV x1 then 1.2g q12 hours Cefepime 1g x1 ordered in ED - will follow for need of additional dosing.  Severiano Gilbert 09/19/2013,3:11 PM

## 2013-09-19 NOTE — ED Notes (Signed)
Contacted lab about result time.

## 2013-09-19 NOTE — ED Notes (Signed)
MD at bedside. 

## 2013-09-19 NOTE — Progress Notes (Signed)
Admitted pt from ED AAOx3. Assisted to bed oriented room and call bell. Tele monitor on. SRup x3. Had BM small amount. o2 in use . No SOB noted. VS taken and recorded Kept npo as ordered. Awaiting meds for initial administration. Admitting nurse notified.

## 2013-09-19 NOTE — ED Provider Notes (Signed)
I saw and evaluated the patient, reviewed the resident's note and I agree with the findings and plan.  EKG Normal sinus rhythm rate 76 Borderline right axis deviation Low Voltage precordial leads Nonspecific T wave changes anterolateral leads When compared to prior EKG borderline right axis deviation is new  On exam pt appears to be in no distress.  Morbidly obese.  Does not feel worse lying flat.  Abdomen no definite fluid wave however exam limited by her body habitus.  Lungs CTA  Will check labs, cxr.  Proceed with further evaluation.  ?Obesity hypovent syndrome, ?PE, CHF? Ascites?    Celene Kras, MD 09/19/13 [phone]

## 2013-09-19 NOTE — ED Notes (Addendum)
From Nursing home and last week drained 4 liters from abdomen. Since then developed shortness of breath worsening overtime. One day ago bilateral lower extremity edema weeping. Constipated for 4 - 5 days.

## 2013-09-19 NOTE — ED Notes (Signed)
Admitting MD at bedside.

## 2013-09-19 NOTE — Progress Notes (Signed)
Report has been taken from ED nurse.  Awaiting pt arrival to unit. Nino Glow RN

## 2013-09-19 NOTE — ED Notes (Signed)
Attempted to call report x 1  

## 2013-09-19 NOTE — ED Provider Notes (Signed)
CSN: [id number]     Arrival date & time 09/19/13  1257 History   First MD Initiated Contact with Patient 09/19/13 1258     Chief Complaint  Patient presents with  . Shortness of Breath   (Consider location/radiation/quality/duration/timing/severity/associated sxs/prior Treatment) Patient is a 67 y.o. female presenting with shortness of breath.  Shortness of Breath Associated symptoms: no abdominal pain, no chest pain, no vomiting and no wheezing    Patient is a 67 yo female with a history of HTN, morbid obesity, OA, and cirrhosis (per patient) who presents with one week of shortness of breath. States last Friday she had about 4 liters of ascites removed via paracentesis which the patient states is due to her fatty liver disease. Notes shortness of breath is worse on sitting up. She states is not short of breath on laying flat. Denies chest pain, nausea, vomiting, constipation, diarrhea, abdominal pain, fevers. Notably had an echo 08/22/13 that revealed mild LVH, EF 55-65%, and no diastolic dysfunction. Patient has been ambulatory over the past week and notes she has been participating in PT at her assisted living center. Has been on lasix 60 mg PO BID to help with volume status.  Past Medical History  Diagnosis Date  . Exogenous obesity   . Hypertension   . Anxiety and depression     chronic  . Hiatal hernia   . Osteoarthritis   . Atypical chest pain 06/10/03    normal 2 day adenosine cardiolite  . Memory changes   . Bipolar disorder    Past Surgical History  Procedure Laterality Date  . Tubal ligation  1977  . Other surgical history  1980    hysterectomy  . Cardiac catheterization  1990    normal cath  . Esophagogastroduodenoscopy N/A 04/22/2013    Procedure: ESOPHAGOGASTRODUODENOSCOPY (EGD);  Surgeon: Shirley Friar, MD;  Location: [facility] ENDOSCOPY;  Service: Endoscopy;  Laterality: N/A;   Family History  Problem Relation Age of Onset  . Heart attack Mother   . Heart disease  Mother   . Heart disease Father   . Heart attack Father    History  Substance Use Topics  . Smoking status: Never Smoker   . Smokeless tobacco: Never Used  . Alcohol Use: No   OB History   Grav Para Term Preterm Abortions TAB SAB Ect Mult Living                 Review of Systems  Constitutional: Negative for activity change and fatigue.  Respiratory: Positive for shortness of breath. Negative for chest tightness and wheezing.   Cardiovascular: Positive for leg swelling. Negative for chest pain.  Gastrointestinal: Positive for abdominal distention. Negative for nausea, vomiting, abdominal pain, diarrhea and constipation.  Genitourinary:       Notes occasional urinary incontinence  Musculoskeletal: Positive for arthralgias (bilateral knees).    Allergies  Codeine and Sulfa antibiotics  Home Medications   Current Outpatient Rx  Name  Route  Sig  Dispense  Refill  . acetaminophen (TYLENOL) 325 MG tablet   Oral   Take 650 mg by mouth every 6 (six) hours as needed for pain.         Marland Kitchen albuterol (PROVENTIL) (2.5 MG/3ML) 0.083% nebulizer solution   Nebulization   Take 2.5 mg by nebulization 4 (four) times daily as needed for wheezing.         Marland Kitchen aspirin 81 MG tablet      Take 1 tablet (81 mg total) by  mouth daily. Restart on June 10   30 tablet      . diltiazem (CARDIZEM) 60 MG tablet   Oral   Take 1 tablet (60 mg total) by mouth 2 (two) times daily.   180 tablet   3   . docusate sodium (COLACE) 100 MG capsule   Oral   Take 100 mg by mouth daily as needed for constipation.         Marland Kitchen doxepin (SINEQUAN) 75 MG capsule   Oral   Take 75 mg by mouth at bedtime.          Marland Kitchen FLUoxetine (PROZAC) 40 MG capsule   Oral   Take 40 mg by mouth 2 (two) times daily. Per Dr.Pulis         . furosemide (LASIX) 40 MG tablet   Oral   Take 40 mg by mouth daily.         Marland Kitchen LORazepam (ATIVAN) 0.5 MG tablet   Oral   Take 0.5 mg by mouth 2 (two) times daily as needed for  anxiety.         . Melatonin 3 MG CAPS   Oral   Take 1 capsule by mouth at bedtime.          . metoCLOPramide (REGLAN) 5 MG tablet   Oral   Take 5 mg by mouth 2 (two) times daily.         . Multiple Vitamins-Minerals (CERTA-VITE PO)   Oral   Take 1 tablet by mouth daily.         . nadolol (CORGARD) 20 MG tablet   Oral   Take 20 mg by mouth daily.         . nitroGLYCERIN (NITROSTAT) 0.4 MG SL tablet   Sublingual   Place 0.4 mg under the tongue every 5 (five) minutes as needed for chest pain.         . pantoprazole (PROTONIX) 40 MG tablet   Oral   Take 1 tablet (40 mg total) by mouth 2 (two) times daily.         . polyethylene glycol (MIRALAX / GLYCOLAX) packet   Oral   Take 17 g by mouth daily as needed (constipation).          BP 104/72  Pulse 73  Temp(Src) 97.9 F (36.6 C) (Oral)  Resp 18  SpO2 97% Physical Exam  Constitutional: She appears well-developed. No distress.  HENT:  Head: Normocephalic and atraumatic.  Nose: Nose normal.  Dry mucus membranes  Eyes: Conjunctivae are normal. Pupils are equal, round, and reactive to light.  Neck: Neck supple.  Cardiovascular: Normal rate, regular rhythm and normal heart sounds.   Pulmonary/Chest: Effort normal and breath sounds normal. No respiratory distress. She has no wheezes. She has no rales.  Abdominal: Soft. There is no tenderness.  Distended, positive fluid wave  Musculoskeletal: She exhibits edema (1+ at mid thigh).  Bilateral LE with ace wrap in place, bilateral calves non-tender to palpation  Neurological: She is alert.  Skin: Skin is warm and dry.    ED Course  Procedures (including critical care time) Labs Review Labs Reviewed  COMPREHENSIVE METABOLIC PANEL - Abnormal; Notable for the following:    Potassium 3.1 (*)    Albumin 2.3 (*)    AST 76 (*)    GFR calc non Af Amer 86 (*)    All other components within normal limits  CBC - Abnormal; Notable for the following:    WBC  3.8 (*)     Hemoglobin 11.5 (*)    HCT 35.4 (*)    RDW 18.6 (*)    All other components within normal limits  D-DIMER, QUANTITATIVE - Abnormal; Notable for the following:    D-Dimer, Quant 2.98 (*)    All other components within normal limits  PRO B NATRIURETIC PEPTIDE  POCT I-STAT TROPONIN I   Imaging Review Dg Chest 2 View  09/19/2013   CLINICAL DATA:  Shortness of breath, hypertension  EXAM: CHEST  2 VIEW  COMPARISON:  08/08/2013  FINDINGS: Moderate cardiac enlargement. Mild aortic arch calcifications. Mild vascular congestion with no evidence of pulmonary edema or consolidation. Lateral view of limited diagnostic utility due to patient body habitus. Some degree of left lower lobe retrocardiac consolidation is suspected.  IMPRESSION: Limited study. Cardiac enlargement with vascular congestion but no pulmonary edema. Focal left lower lobe consolidation suspected although this area is poorly defined.   Electronically Signed   By: Esperanza Heir M.D.   On: 09/19/2013 14:18   US Abdomen Limited  09/19/2013   CLINICAL DATA:  Abdominal distention. Ascites.  EXAM: US ABDOMEN LIMITED  COMPARISON:  09/13/2013.  FINDINGS: Large volume of ascites is present. This is identified in all 4 quadrants.  IMPRESSION:  Large volume ascites.   Electronically Signed   By: Andreas Newport M.D.   On: 09/19/2013 15:01    EKG Interpretation   None      EKG: NSR with rate of 76, right axis deviation noted, flattening of T-waves in leads 1, aVL, and V2-6 Right axis deviation is new from prior EKG  MDM   1. HCAP (healthcare-associated pneumonia)   2. Ascites   3. Anemia   4. Hypokalemia    1:30 pm: patient seen and examined. Patient with one week of increasing shortness of breath following paracentesis last Friday. Has noticed progressive re-accumulation of fluid in her abdomen and has fluid accumulation in her bilateral LE at her mid-thigh. Differential for the patients dyspnea is ACS, PNA, PE, ascites. CHF less  likely given recent normal echo. Wells score of 0 places in low risk category and perc rule score of 1 indicates need for d-dimer. Will obtain iStat troponin, d-dimer, CMET, CBC, CXR, and pro-BNP. In addition will obtain US abd to evaluate for ascites.  2:43 pm: CXR read as likely infiltrate in left lower lobe. Will start treatment for HCAP based on CXR findings. Per verbal report patient with ascites. Will await final read of [place] abd. Given shortness of breath and HCAP patient will need to be admitted for IV antibiotics. Will await return of labs prior to calling for admission.  3:10 pm: patient with oxygen on. Was placed by EMS due to shortness of breath. I turned this off and patient maintained O2 sats and was not short of breath. Will await return of labs and call for admission.  3:57 pm: D-dimer returned elevated to 2.98. Will order CT angio of chest. Will also call [facility] for admission. Potassium low at 3.1 will replete with PO 40 mEq x1. WBC at 3.8 appears to be near baseline possibly not indicating infectious process, so shortness of breath may be more related to volume status, though will continue HCAP treatment at this time.  4:03 pm: spoke with Dr. Timothy Lasso of [facility]. He will see the patient for admission.   Marikay Alar, MD [facility] PGY-2 09/19/13 4:12 pm  Glori Luis, MD 09/19/13 [phone]

## 2013-09-19 NOTE — ED Notes (Signed)
MD Knapp at bedside 

## 2013-09-20 ENCOUNTER — Inpatient Hospital Stay (HOSPITAL_COMMUNITY): Payer: Medicare Other

## 2013-09-20 LAB — COMPREHENSIVE METABOLIC PANEL
AST: 65 U/L — ABNORMAL HIGH (ref 0–37)
Albumin: 2 g/dL — ABNORMAL LOW (ref 3.5–5.2)
Calcium: 8.4 mg/dL (ref 8.4–10.5)
Creatinine, Ser: 0.67 mg/dL (ref 0.50–1.10)
GFR calc non Af Amer: 89 mL/min — ABNORMAL LOW (ref >90)
Total Protein: 5.9 g/dL — ABNORMAL LOW (ref 6.0–8.3)

## 2013-09-20 LAB — CBC
HCT: 32.8 % — ABNORMAL LOW (ref 36.0–46.0)
Hemoglobin: 10.6 g/dL — ABNORMAL LOW (ref 12.0–15.0)
MCH: 28.1 pg (ref 26.0–34.0)
MCHC: 32.3 g/dL (ref 30.0–36.0)
MCV: 87 fL (ref 78.0–100.0)
Platelets: 113 10*3/uL — ABNORMAL LOW (ref 150–400)
RBC: 3.77 MIL/uL — ABNORMAL LOW (ref 3.87–5.11)
RDW: 18.7 % — ABNORMAL HIGH (ref 11.5–15.5)
WBC: 3.3 10*3/uL — ABNORMAL LOW (ref 4.0–10.5)

## 2013-09-20 LAB — PROTIME-INR
INR: 1.45 (ref 0.00–1.49)
Prothrombin Time: 17.3 seconds — ABNORMAL HIGH (ref 11.6–15.2)

## 2013-09-20 MED ORDER — ADULT MULTIVITAMIN W/MINERALS CH
1.0000 | ORAL_TABLET | Freq: Every day | ORAL | Status: DC
  Administered 2013-09-20 – 2013-09-25 (×6): 1 via ORAL
  Filled 2013-09-20 (×6): qty 1

## 2013-09-20 MED ORDER — BOOST PLUS PO LIQD
237.0000 mL | ORAL | Status: DC | PRN
  Filled 2013-09-20: qty 237

## 2013-09-20 MED ORDER — PRO-STAT SUGAR FREE PO LIQD
30.0000 mL | Freq: Two times a day (BID) | ORAL | Status: DC
  Administered 2013-09-20 – 2013-09-24 (×3): 30 mL via ORAL
  Filled 2013-09-20 (×12): qty 30

## 2013-09-20 NOTE — Progress Notes (Signed)
Utilization Review Completed.   Kemara Quigley, RN, BSN Nurse Case Manager  [phone]  

## 2013-09-20 NOTE — Procedures (Signed)
US guided therapeutic paracentesis performed yielding 5.5 liters (maximum ordered) yellow fluid. No immediate complications.

## 2013-09-20 NOTE — Consult Note (Signed)
Subjective:   HPI  The patient is a 67 year old female known to my partner Dr. Madilyn Fireman. She has a history of Nash cirrhosis. She has had problems with ascites according to her since May of this year. Last week she had a paracentesis with removal of 3.5 L of fluid which was cultured negative and cytology was negative. Today in the hospital she had another paracentesis with removal of 5.5 L do to reaccumulation of the fluid. She has been on Lasix. She has not been on Aldactone. She has not restricted sodium intake. We are asked to see her to help with management of ascites  Review of Systems Some shortness of breath related to increased abdominal girth  Past Medical History  Diagnosis Date  . Exogenous obesity   . Hypertension   . Anxiety and depression     chronic  . Hiatal hernia   . Atypical chest pain 06/10/03    normal 2 day adenosine cardiolite  . Memory changes   . Bipolar disorder   . Ascites   . Cirrhosis   . Anginal pain   . Exertional shortness of breath   . Shortness of breath     "all the time right now" (09/19/2013)  . Anemia   . History of blood transfusion 05/2013    "once; related to a stomach ulcer" (09/19/2013)  . Bleeding stomach ulcer 04/22/2013  . GERD (gastroesophageal reflux disease)   . Osteoarthritis   . Arthritis     "all over my body" (09/19/2013)  . Anxiety   . Depression   . Fatty liver disease, nonalcoholic    Past Surgical History  Procedure Laterality Date  . Tubal ligation  1977  . Cardiac catheterization  1990    normal cath  . Esophagogastroduodenoscopy N/A 04/22/2013    Procedure: ESOPHAGOGASTRODUODENOSCOPY (EGD);  Surgeon: Shirley Friar, MD;  Location: [facility] ENDOSCOPY;  Service: Endoscopy;  Laterality: N/A;  . Paracentesis  09/13/2013    "just once" (09/19/2013)  . Carpal tunnel release Bilateral 1990's  . Knee arthroscopy w/ acl reconstruction Bilateral ?1990's  . Vaginal hysterectomy  1980  . Salpingoophorectomy Bilateral 1990's    History   Social History  . Marital Status: Widowed    Spouse Name: N/A    Number of Children: N/A  . Years of Education: N/A   Occupational History  . Not on file.   Social History Main Topics  . Smoking status: Never Smoker   . Smokeless tobacco: Never Used  . Alcohol Use: No  . Drug Use: No  . Sexual Activity: Not Currently   Other Topics Concern  . Not on file   Social History Narrative  . No narrative on file   family history includes Heart attack in her father and mother; Heart disease in her father and mother. Current facility-administered medications:0.9 %  sodium chloride infusion, 250 mL, Intravenous, PRN, Gwen Pounds, MD;  acetaminophen (TYLENOL) suppository 650 mg, 650 mg, Rectal, Q12H PRN, Gwen Pounds, MD;  acetaminophen (TYLENOL) tablet 650 mg, 650 mg, Oral, Q12H PRN, Gwen Pounds, MD;  aspirin EC tablet 81 mg, 81 mg, Oral, Daily, Gwen Pounds, MD, 81 mg at 09/20/13 0454 docusate sodium (COLACE) capsule 100 mg, 100 mg, Oral, Daily PRN, Gwen Pounds, MD;  doxepin [facility]) capsule 75 mg, 75 mg, Oral, QHS, Gwen Pounds, MD, 75 mg at 09/19/13 2208;  FLUoxetine (PROZAC) capsule 40 mg, 40 mg, Oral, BID, Judie Bonus Hammons, RPH, 40 mg at 09/20/13 0981;  furosemide (LASIX) injection 60 mg, 60 mg, Intravenous, BID, Gwen Pounds, MD, 60 mg at 09/20/13 0858 lactulose (CHRONULAC) 10 GM/15ML solution 10 g, 10 g, Oral, Daily PRN, Gwen Pounds, MD;  LORazepam (ATIVAN) tablet 0.5 mg, 0.5 mg, Oral, BID PRN, Gwen Pounds, MD;  metoCLOPramide [facility]) tablet 5 mg, 5 mg, Oral, BID, Gwen Pounds, MD, 5 mg at 09/20/13 1610;  nadolol (CORGARD) tablet 20 mg, 20 mg, Oral, Daily, Gwen Pounds, MD, 20 mg at 09/20/13 0905;  nitroGLYCERIN (NITROSTAT) SL tablet 0.4 mg, 0.4 mg, Sublingual, Q5 min PRN, Gwen Pounds, MD ondansetron [facility]) injection 4 mg, 4 mg, Intravenous, Q6H PRN, Gwen Pounds, MD;  ondansetron [facility]) tablet 4 mg, 4 mg, Oral, Q6H PRN, Gwen Pounds, MD;  pantoprazole  (PROTONIX) EC tablet 40 mg, 40 mg, Oral, BID, Gwen Pounds, MD, 40 mg at 09/20/13 0905;  potassium chloride SA (K-DUR,KLOR-CON) CR tablet 40 mEq, 40 mEq, Oral, BID, Gwen Pounds, MD, 40 mEq at 09/20/13 0906 sodium chloride 0.9 % injection 3 mL, 3 mL, Intravenous, Q12H, Gwen Pounds, MD, 3 mL at 09/20/13 0906;  sodium chloride 0.9 % injection 3 mL, 3 mL, Intravenous, Q12H, Gwen Pounds, MD, 3 mL at 09/20/13 0906;  sodium chloride 0.9 % injection 3 mL, 3 mL, Intravenous, PRN, Gwen Pounds, MD;  spironolactone (ALDACTONE) tablet 12.5 mg, 12.5 mg, Oral, Daily, Gwen Pounds, MD, 12.5 mg at 09/20/13 9604 Allergies  Allergen Reactions  . Codeine     itching  . Sulfa Antibiotics     Pull my hair out     Objective:     BP 101/45  Pulse 80  Temp(Src) 98.1 F (36.7 C) (Oral)  Resp 20  Ht 5\' 6"  (1.676 m)  Wt 129.6 kg (285 lb 11.5 oz)  BMI 46.14 kg/m2  SpO2 95%  She is in no distress  Nonicteric  Heart regular rhythm  Lungs clear  Abdomen, obese, ascites, nontender, unable to feel liver or spleen  Laboratory No components found with this basename: d1      Assessment:     #1. Nash cirrhosis  #2. Ascites      Plan:     This patient will need to be on a 2 g sodium diet. I agree with the use of Lasix and and now she has been started on Aldactone which I also agree with. I think she is going to need a combination of both. Her Aldactone has been started at a low dose and I suspect it will need to be increased. Repeat paracentesis when necessary. Lab Results  Component Value Date   HGB 10.6* 09/20/2013   HGB 11.5* 09/19/2013   HGB 10.4* 08/08/2013   HCT 32.8* 09/20/2013   HCT 35.4* 09/19/2013   HCT 32.8* 08/08/2013   ALKPHOS 72 09/20/2013   ALKPHOS 83 09/19/2013   ALKPHOS 72 04/22/2013   AST 65* 09/20/2013   AST 76* 09/19/2013   AST 39* 04/22/2013   ALT 21 09/20/2013   ALT 26 09/19/2013   ALT 20 04/22/2013

## 2013-09-20 NOTE — Progress Notes (Signed)
Pt was up in chair with PT for 2 hrs tolerated well. When time to go back to bed pt said she has a fear of falling. Assisted by 2 staff, front wheel walker. Bed and chair close to pt. Giving positive feedback and encouragement. Pt stood up holding breathe and not moving walker. Reminded to breathe yelled out the room is starting to spin so walker and pt gently shifted towards bed and pt plopped on the bed. Says this happened all the time. Gave her time to recover- repositioned and back to normal state within 15 min.

## 2013-09-20 NOTE — Care Management Note (Signed)
    Page 1 of 1   09/20/2013     1:28:33 PM   CARE MANAGEMENT NOTE 09/20/2013  Patient:  [name], [name]   Account Number:  [id number]  Date Initiated:  09/20/2013  Documentation initiated by:  Tera Mater  Subjective/Objective Assessment:   67yo female admitted with CHF from [facility].     Action/Plan:   discharge planning   Anticipated DC Date:  09/22/2013   Anticipated DC Plan:  ASSISTED LIVING / REST HOME         Choice offered to / List presented to:             [facility] agency  [facility]   Status of service:  In process, will continue to follow Medicare Important Message given?   (If response is "NO", the following Medicare IM given date fields will be blank) Date Medicare IM given:   Date Additional Medicare IM given:    Discharge Disposition:    Per UR Regulation:  Reviewed for med. necessity/level of care/duration of stay  If discussed at Long Length of Stay Meetings, dates discussed:    Comments:  09/20/13 1324 Pt. is currently being seen by [facility] RN/PT/OT.  Will obtain resumption orders. Corrie Dandy, with [facility] aware of pt. admission.  NCM will follow for further dc needs. Tera Mater, RN, BSN NCM [phone]

## 2013-09-20 NOTE — Progress Notes (Signed)
Subjective: No major changes since last night. Admitted c Vol Overload - Needing Diuresis and Paracentesis. Will consult GI. No new C/O   Objective: Vital signs in last 24 hours: Temp:  [97.8 F (36.6 C)-98.2 F (36.8 C)] 97.8 F (36.6 C) (10/10 0445) Pulse Rate:  [73-80] 80 (10/10 0445) Resp:  [11-19] 19 (10/10 0445) BP: (98-136)/(48-77) 98/59 mmHg (10/10 0445) SpO2:  [93 %-98 %] 93 % (10/10 0445) Weight:  [129.6 kg (285 lb 11.5 oz)-129.9 kg (286 lb 6 oz)] 129.6 kg (285 lb 11.5 oz) (10/10 0445) Weight change:  Last BM Date: 09/19/13  CBG (last 3)  No results found for this basename: GLUCAP,  in the last 72 hours  Intake/Output from previous day:  Intake/Output Summary (Last 24 hours) at 09/20/13 0659 Last data filed at 09/20/13 0246  Gross per 24 hour  Intake      0 ml  Output    900 ml  Net   -900 ml   10/09 0701 - 10/10 0700 In: -  Out: 900 [Urine:900]   Physical Exam  General appearance: Alert, Mouth Dry. Face and MS at baseline. Eyes: no scleral icterus Throat: oropharynx moist without erythema Resp: Distant but clear Cardio: Reg GI: Softer, Ascites, Obese, fluid wave. Extremities: + edema   Lab Results:  Recent Labs  09/19/13 1337 09/20/13 0518  NA 142 143  K 3.1* 3.8  CL 102 106  CO2 31 28  GLUCOSE 90 78  BUN 10 10  CREATININE 0.74 0.67  CALCIUM 8.6 8.4     Recent Labs  09/19/13 1337 09/20/13 0518  AST 76* 65*  ALT 26 21  ALKPHOS 83 72  BILITOT 1.1 1.4*  PROT 6.8 5.9*  ALBUMIN 2.3* 2.0*     Recent Labs  09/19/13 1337 09/20/13 0518  WBC 3.8* 3.3*  HGB 11.5* 10.6*  HCT 35.4* 32.8*  MCV 87.4 87.0  PLT 116* 113*    Lab Results  Component Value Date   INR 1.45 09/20/2013    No results found for this basename: CKTOTAL, CKMB, CKMBINDEX, TROPONINI,  in the last 72 hours  No results found for this basename: TSH, T4TOTAL, FREET3, T3FREE, THYROIDAB,  in the last 72 hours  No results found for this basename: VITAMINB12,  FOLATE, FERRITIN, TIBC, IRON, RETICCTPCT,  in the last 72 hours  Micro Results: Recent Results (from the past 240 hour(s))  BODY FLUID CULTURE     Status: None   Collection Time    09/13/13  1:54 PM      Result Value Range Status   Specimen Description ASCITIC ABDOMEN FLUID   Final   Special Requests FLUID   Final   Gram Stain     Final   Value: NO WBC SEEN     NO ORGANISMS SEEN     Performed at [facility]   Culture     Final   Value: NO GROWTH 3 DAYS     Performed at [facility]   Report Status 09/17/2013 FINAL   Final  MRSA PCR SCREENING     Status: None   Collection Time    09/19/13  8:08 PM      Result Value Range Status   MRSA by PCR NEGATIVE  NEGATIVE Final   Comment:            The GeneXpert MRSA Assay (FDA     approved for NASAL specimens     only), is one component of a  comprehensive MRSA colonization     surveillance program. It is not     intended to diagnose MRSA     infection nor to guide or     monitor treatment for     MRSA infections.     Studies/Results: Dg Chest 2 View  09/19/2013   CLINICAL DATA:  Shortness of breath, hypertension  EXAM: CHEST  2 VIEW  COMPARISON:  08/08/2013  FINDINGS: Moderate cardiac enlargement. Mild aortic arch calcifications. Mild vascular congestion with no evidence of pulmonary edema or consolidation. Lateral view of limited diagnostic utility due to patient body habitus. Some degree of left lower lobe retrocardiac consolidation is suspected.  IMPRESSION: Limited study. Cardiac enlargement with vascular congestion but no pulmonary edema. Focal left lower lobe consolidation suspected although this area is poorly defined.   Electronically Signed   By: Esperanza Heir M.D.   On: 09/19/2013 14:18   Ct Angio Chest W/cm &/or Wo Cm  09/19/2013   CLINICAL DATA:  Shortness of breath.  EXAM: CT ANGIOGRAPHY CHEST WITH CONTRAST  TECHNIQUE: Multidetector CT imaging of the chest was performed using the standard  protocol during bolus administration of intravenous contrast. Multiplanar CT image reconstructions including MIPs were obtained to evaluate the vascular anatomy.  CONTRAST:  OMNIPAQUE IOHEXOL 350 MG/ML SOLN  COMPARISON:  Chest radiograph 09/19/2013  FINDINGS: This is a technically good evaluation of the pulmonary arterial tree. No focal filling defects are identified in the pulmonary arteries to suggest pulmonary embolism. Evaluation of the subsegmental pulmonary artery branches in the left lower lobe is somewhat limited by collapse/consolidation of that portion of the left lower lobe.  The heart is enlarged.  There is a small left pleural effusion.  There is a moderate-sized hiatal hernia. The wall of the herniated portion of the stomach appears thickened, but it could be accentuated by decompression.  The thoracic aorta is normal in caliber and enhancement.  No lymphadenopathy is detected in the thorax. Lung windows demonstrate collapse/ consolidation of the basilar portion of the left lower lobe. No pulmonary nodule or mass is detected. There is mild atelectasis in the right lower lobe.  The bones appear osteopenic. No acute bony abnormality is identified.  Upper abdomen: A moderate amount of abdominal ascites is seen in the upper abdomen. The liver contour appears nodular, consistent with hepatic cirrhosis. The spleen is not entirely visualized. The imaged portion appears within normal limits.  Review of the MIP images confirms the above findings.  IMPRESSION: 1. Collapse and/or consolidation of the basilar portion of the left lower lobe. Airspace disease related to infection or aspiration cannot be excluded in the left lower lobe.  2. Small left pleural effusion.  3. Moderate hiatal hernia. The herniated portion of the stomach appears to have a thickened wall, for which gastritis or malignancy cannot be excluded.  4. Hepatic cirrhosis and abdominal ascites  5. No evidence of pulmonary embolism  6.  Cardiomegaly   Electronically Signed   By: Britta Mccreedy M.D.   On: 09/19/2013 18:00   US Abdomen Limited  09/19/2013   CLINICAL DATA:  Abdominal distention. Ascites.  EXAM: US ABDOMEN LIMITED  COMPARISON:  09/13/2013.  FINDINGS: Large volume of ascites is present. This is identified in all 4 quadrants.  IMPRESSION:  Large volume ascites.   Electronically Signed   By: Andreas Newport M.D.   On: 09/19/2013 15:01     Medications: Scheduled: . aspirin EC  81 mg Oral Daily  . doxepin  75  mg Oral QHS  . FLUoxetine  40 mg Oral BID  . furosemide  60 mg Intravenous BID  . metoCLOPramide  5 mg Oral BID  . nadolol  20 mg Oral Daily  . pantoprazole  40 mg Oral BID  . potassium chloride  40 mEq Oral BID  . sodium chloride  3 mL Intravenous Q12H  . sodium chloride  3 mL Intravenous Q12H  . spironolactone  12.5 mg Oral Daily   Continuous:    Assessment/Plan: Principal Problem:   Ascites Active Problems:   Exogenous obesity   Cirrhosis   Shortness of breath  Progressive Vol Overload in pt with Cirrhosis. Cirrhosis from NASH. S/P recent Paracentesis as outpt and on order for one today.  She is uncomfortable c all the fluid.  May need serial taps.  If unsuccessful, consider drainage catheter.  Continue aggressive Diurese as her Cr is tolerating.  Foley in place.  Get Dr Hayes/Dr Evette Cristal to consult. Pt already on BB and Lasix as outpt. Aldactone ordered. Re-check Ammonia only 44 and consider start of lactulose as needed. Albumin Low at 2.0. AST elevated at 65-80. INR 1.4. Leukopenia and Thrombocytopenia C/W Cirrhosis.  Protein malnutrition - Nutrition consult Pending. Encouraged protein consumption. Boost/Ensure.   SOB from Vol Overload. No Clinical PNA. Discussed c pt and daughter - hold Abx for now. Recent ECHO was better than expected. No Pulm HTN noted although it may have been present as Obesity Hypoventilation certainly may be playing a role. Sats are 90+% RA, No Fever, no wbc - Infection is low  likely. Incentive spirometry ordered.  Recheck CXR over Weekend.  Adult failure to thrive - I think she will stay AL forever. PT/OT/CW ordered  HTN - BP is lowish.  Watch c diuresis Constipation -consider change to Lactulose. Ammonia was fine B LE Edema Nausea - prn Zofran 4 Mg prn  Thickened Stomach issues on CTPA - GI to comment but she is UTD on EGDs when she had her PUDz DVT proph c squeezers until post Procedure. OK for Lovenox later today or tomorrow. K Repleted Depression - Mood stable on meds.     LOS: 1 day   Verona Hartshorn M 09/20/2013, 6:59 AM

## 2013-09-20 NOTE — Progress Notes (Addendum)
INITIAL NUTRITION ASSESSMENT  DOCUMENTATION CODES Per approved criteria  - Morbidly Obese   INTERVENTION: Encouraged healthful eating and incorporation of protein-rich foods at each meal Provide Multivitamin with minerals daily Provide Pro-stat BID Pt may benefit from additional supplements: Vitamin B-complex supplement and water-soluble forms of Vitamin A, D, E, and K Provide Boost Plus as needed (for missed meals)   NUTRITION DIAGNOSIS: Increased protein/nutrient needs related to ascites and cirrhosis as evidenced by estimated needs.   Goal: Pt to meet >/= 90% of their estimated nutrition needs   Monitor:  PO intake Weight trends Labs  Reason for Assessment: Consult  67 y.o. female  Admitting Dx: Ascites  ASSESSMENT: 26 F pt with known Cirrhosis from NASH S/p recent 3.5 L paracentesis and also maintained on chronic Diuretic therapy. She resides AL due to AFTT. The AL called this am with worsening LE Edema, Weaping, tense abdomen, Increasing SOB.   Pt reports having a very good appetite today due to being NPO yesterday and this morning. Pt reports that PTA her appetite would vary depending on presence of ascites and she would drink Boost supplements on days when she wasn't eating well. Pt reports her usual body weight without ascites is 270 lbs. Pt ate 90% of her lunch.  Encouraged pt to eat protein-rich foods at each meal, encouraged adequate carbohydrates, small meals and snacks throughout the day, and daily multivitamin with minerals. Encouraged continued intake of Boost 1-2 times per day on days when she has a poor appetite and is not eating enough. Encouraged low fat food choices. Pt voiced understanding.   Height: Ht Readings from Last 1 Encounters:  09/19/13 5\' 6"  (1.676 m)    Weight: Wt Readings from Last 1 Encounters:  09/20/13 285 lb 11.5 oz (129.6 kg)    Ideal Body Weight: 130 lbs  % Ideal Body Weight: 219%  Wt Readings from Last 10 Encounters:   09/20/13 285 lb 11.5 oz (129.6 kg)  08/08/13 302 lb (136.986 kg)  04/22/13 280 lb (127.007 kg)  04/22/13 280 lb (127.007 kg)  05/17/12 270 lb (122.471 kg)  11/09/11 256 lb (116.121 kg)    Usual Body Weight: 270 lbs  % Usual Body Weight: 105%  BMI:  Body mass index is 46.14 kg/(m^2).  Estimated Nutritional Needs: Kcal: 2000-2300 Protein: 130-150 grams Fluid: 3.1 L/day  Skin: +3 RLE and LLE edema; ascites  Diet Order: Cardiac  EDUCATION NEEDS: -Education needs addressed   Intake/Output Summary (Last 24 hours) at 09/20/13 1354 Last data filed at 09/20/13 0941  Gross per 24 hour  Intake      0 ml  Output   1500 ml  Net  -1500 ml    Last BM: 10/9  Labs:   Recent Labs Lab 09/19/13 1337 09/20/13 0518  NA 142 143  K 3.1* 3.8  CL 102 106  CO2 31 28  BUN 10 10  CREATININE 0.74 0.67  CALCIUM 8.6 8.4  GLUCOSE 90 78    CBG (last 3)  No results found for this basename: GLUCAP,  in the last 72 hours  Scheduled Meds: . aspirin EC  81 mg Oral Daily  . doxepin  75 mg Oral QHS  . FLUoxetine  40 mg Oral BID  . furosemide  60 mg Intravenous BID  . metoCLOPramide  5 mg Oral BID  . nadolol  20 mg Oral Daily  . pantoprazole  40 mg Oral BID  . potassium chloride  40 mEq Oral BID  . sodium chloride  3 mL Intravenous Q12H  . sodium chloride  3 mL Intravenous Q12H  . spironolactone  12.5 mg Oral Daily    Continuous Infusions:   Past Medical History  Diagnosis Date  . Exogenous obesity   . Hypertension   . Anxiety and depression     chronic  . Hiatal hernia   . Atypical chest pain 06/10/03    normal 2 day adenosine cardiolite  . Memory changes   . Bipolar disorder   . Ascites   . Cirrhosis   . Anginal pain   . Exertional shortness of breath   . Shortness of breath     "all the time right now" (09/19/2013)  . Anemia   . History of blood transfusion 05/2013    "once; related to a stomach ulcer" (09/19/2013)  . Bleeding stomach ulcer 04/22/2013  . GERD  (gastroesophageal reflux disease)   . Osteoarthritis   . Arthritis     "all over my body" (09/19/2013)  . Anxiety   . Depression   . Fatty liver disease, nonalcoholic     Past Surgical History  Procedure Laterality Date  . Tubal ligation  1977  . Cardiac catheterization  1990    normal cath  . Esophagogastroduodenoscopy N/A 04/22/2013    Procedure: ESOPHAGOGASTRODUODENOSCOPY (EGD);  Surgeon: Shirley Friar, MD;  Location: [facility] ENDOSCOPY;  Service: Endoscopy;  Laterality: N/A;  . Paracentesis  09/13/2013    "just once" (09/19/2013)  . Carpal tunnel release Bilateral 1990's  . Knee arthroscopy w/ acl reconstruction Bilateral ?1990's  . Vaginal hysterectomy  1980  . Salpingoophorectomy Bilateral 1990's    Ian Malkin RD, LDN Inpatient Clinical Dietitian Pager: [phone] After Hours Pager: [phone]

## 2013-09-20 NOTE — Evaluation (Signed)
Occupational Therapy Evaluation Patient Details Name: [name] MRN: [id number] DOB: 30-May-1946 Today's Date: 09/20/2013 Time: 7829-5621 OT Time Calculation (min): 35 min  OT Assessment / Plan / Recommendation History of present illness Pt admit for ascites, cirrhosis, and volume overload.   Clinical Impression   This 67 yo female admitted with above presents to acute OT with problems below. Pt reports she was a SNF for 6 weeks, then has been at ALF for 6 weeks. Feel best place for pt to now would be SNF for more intense rehab; pt does NOT want [facility].    OT Assessment  Patient needs continued OT Services    Follow Up Recommendations  SNF    Barriers to Discharge Decreased caregiver support    Equipment Recommendations  None recommended by OT       Frequency  Min 2X/week    Precautions / Restrictions Precautions Precautions: Fall Restrictions Weight Bearing Restrictions: No       ADL  Eating/Feeding: Set up Where Assessed - Eating/Feeding: Edge of bed Grooming: Minimal assistance Where Assessed - Grooming: Unsupported sitting Upper Body Bathing: Moderate assistance Where Assessed - Upper Body Bathing: Unsupported sitting Lower Body Bathing: +1 Total assistance Where Assessed - Lower Body Bathing: Supported sit to stand Upper Body Dressing: +1 Total assistance Where Assessed - Upper Body Dressing: Unsupported sitting Lower Body Dressing: +1 Total assistance Where Assessed - Lower Body Dressing: Supported sit to Pharmacist, hospital: +2 Total assistance Toilet Transfer: Patient Percentage: 60% Statistician Method: Sit to Barista:  (Bed> 5 steps to recliner) Toileting - Clothing Manipulation and Hygiene: +1 Total assistance (with addtional +1 to stand) Where Assessed - Toileting Clothing Manipulation and Hygiene: Standing Equipment Used: Rolling walker;Gait belt Transfers/Ambulation Related to ADLs: total A +2 (pt=60%)  sit<>stand and ambulation with RW    OT Diagnosis: Generalized weakness  OT Problem List: Decreased strength;Decreased range of motion;Impaired balance (sitting and/or standing);Obesity;Decreased knowledge of use of DME or AE OT Treatment Interventions: Self-care/ADL training;Balance training;Therapeutic activities;DME and/or AE instruction;Patient/family education   OT Goals(Current goals can be found in the care plan section) Acute Rehab OT Goals Patient Stated Goal: to go home OT Goal Formulation: With patient Time For Goal Achievement: 10/04/13 Potential to Achieve Goals: Good  Visit Information  Last OT Received On: 09/20/13 Assistance Needed: +2 PT/OT Co-Evaluation/Treatment: Yes History of Present Illness: Pt admit for ascites, cirrhosis, and volume overload.       Prior Functioning     Home Living Family/patient expects to be discharged to:: Assisted living Home Equipment: Walker - 4 wheels;Tub bench Prior Function Level of Independence: Needs assistance ADL's / Homemaking Assistance Needed: could do her own bathing and dressing but would take her 3 times as long as normal so staff did these tasks for her. She could transfer on/off toliet and do clothing management, but staff had to perform hygiene Communication Communication: No difficulties Dominant Hand: Right         Vision/Perception Vision - History Patient Visual Report: No change from baseline   Cognition  Cognition Arousal/Alertness: Awake/alert Behavior During Therapy: WFL for tasks assessed/performed Overall Cognitive Status: Within Functional Limits for tasks assessed    Extremity/Trunk Assessment Upper Extremity Assessment Upper Extremity Assessment: RUE deficits/detail RUE Deficits / Details: Has fallen on her right shoulder 4 times; xrays have been negative, she has had shots in it; rest of arm [facility] Lower Extremity Assessment Lower Extremity Assessment: RLE deficits/detail;LLE  deficits/detail RLE Deficits / Details: grossly  3/5 LLE Deficits / Details: grossly 3/5     Mobility Bed Mobility Bed Mobility: Rolling Right;Right Sidelying to Sit;Sitting - Scoot to Edge of Bed Rolling Right: 3: Mod assist;With rail Right Sidelying to Sit: 3: Mod assist;HOB flat Sitting - Scoot to Edge of Bed: 3: Mod assist Details for Bed Mobility Assistance: Noted nystagmus with rolling.  Tested revealed left posterior canal canalithiasis as well as possible horizontal nystagmus.  Treated for left posterior canal canalithiasis via canalith repositioning manuever.  Will assess next visit if effective and treat further as needed.   Transfers Sit to Stand: 1: +2 Total assist;With upper extremity assist;From bed Sit to Stand: Patient Percentage: 60% Stand to Sit: 1: +2 Total assist;With upper extremity assist;To chair/3-in-1;With armrests Stand to Sit: Patient Percentage: 60% Details for Transfer Assistance: Pt stood with cues for hand placement.  Steadying assist needed for stability.  Pt able to weight shift and take pivotal steps to chair with RW and cues for sequencing steps.             End of Session OT - End of Session Equipment Utilized During Treatment: Gait belt;Rolling walker Activity Tolerance: Patient tolerated treatment well Patient left: in chair;with call bell/phone within reach Nurse Communication: Mobility status       Evette Georges [phone] 09/20/2013, 3:49 PM

## 2013-09-20 NOTE — Progress Notes (Signed)
Physical Therapy Evaluation Patient Details Name: KRISTEN FROMM MRN: [id number] DOB: [birth year] Today's Date: 09/20/2013 Time: 1478-2956 PT Time Calculation (min): 32 min  PT Assessment / Plan / Recommendation History of Present Illness  Pt admit for ascites, cirrhosis, and volume overload.  Clinical Impression  Pt admitted with above. Pt currently with functional limitations due to the deficits listed below (see PT Problem List).  Needs SNF with vestibular rehab to reach maximal goals.  Pt will benefit from skilled PT to increase their independence and safety with mobility to allow discharge to the venue listed below.      PT Assessment  Patient needs continued PT services    Follow Up Recommendations  SNF;Supervision/Assistance - 24 hour        Barriers to Discharge Decreased caregiver support      Equipment Recommendations  None recommended by PT         Frequency Min 3X/week    Precautions / Restrictions Precautions Precautions: Fall Restrictions Weight Bearing Restrictions: No   Pertinent Vitals/Pain VSS, no pain      Mobility  Bed Mobility Bed Mobility: Rolling Right;Right Sidelying to Sit;Sitting - Scoot to Edge of Bed Rolling Right: 3: Mod assist;With rail Right Sidelying to Sit: 3: Mod assist;HOB flat Sitting - Scoot to Edge of Bed: 3: Mod assist Details for Bed Mobility Assistance: Noted nystagmus with rolling.  Tested revealed left posterior canal canalithiasis as well as possible horizontal nystagmus.  Treated for left posterior canal canalithiasis via canalith repositioning manuever.  Will assess next visit if effective and treat further as needed.   Transfers Transfers: Sit to Stand;Stand to Sit;Stand Pivot Transfers Sit to Stand: 1: +2 Total assist;With upper extremity assist;From bed Sit to Stand: Patient Percentage: 60% Stand to Sit: 1: +2 Total assist;With upper extremity assist;To chair/3-in-1;With armrests Stand to Sit: Patient Percentage:  60% Stand Pivot Transfers: 1: +2 Total assist Stand Pivot Transfers: Patient Percentage: 60% Details for Transfer Assistance: Pt stood with cues for hand placement.  Steadying assist needed for stability.  Pt able to weight shift and take pivotal steps to chair with RW and cues for sequencing steps.   Ambulation/Gait Ambulation/Gait Assistance: Not tested (comment) Stairs: No Wheelchair Mobility Wheelchair Mobility: No         PT Diagnosis: Generalized weakness  PT Problem List: Decreased activity tolerance;Decreased balance;Decreased mobility;Decreased knowledge of use of DME;Decreased knowledge of precautions;Cardiopulmonary status limiting activity;Decreased safety awareness PT Treatment Interventions: DME instruction;Gait training;Functional mobility training;Therapeutic activities;Therapeutic exercise;Balance training;Patient/family education     PT Goals(Current goals can be found in the care plan section) Acute Rehab PT Goals Patient Stated Goal: to go home PT Goal Formulation: With patient Time For Goal Achievement: 10/04/13 Potential to Achieve Goals: Good  Visit Information  Last PT Received On: 09/20/13 Assistance Needed: +2 PT/OT Co-Evaluation/Treatment: Yes History of Present Illness: Pt admit for ascites, cirrhosis, and volume overload.       Prior Functioning  Home Living Family/patient expects to be discharged to:: Assisted living Home Equipment: Walker - 4 wheels;Tub bench Prior Function Level of Independence: Independent with assistive device(s) Communication Communication: No difficulties Dominant Hand: Right    Cognition  Cognition Arousal/Alertness: Awake/alert Behavior During Therapy: WFL for tasks assessed/performed Overall Cognitive Status: Within Functional Limits for tasks assessed    Extremity/Trunk Assessment Upper Extremity Assessment Upper Extremity Assessment: Defer to OT evaluation Lower Extremity Assessment Lower Extremity  Assessment: RLE deficits/detail;LLE deficits/detail RLE Deficits / Details: grossly 3/5 LLE Deficits / Details: grossly 3/5  Balance    End of Session PT - End of Session Equipment Utilized During Treatment: Gait belt Activity Tolerance: Patient limited by fatigue Patient left: in chair;with call bell/phone within reach Nurse Communication: Mobility status       INGOLD,Tlaloc Taddei 09/20/2013, 3:33 PM [facility] Acute Rehabilitation [phone] (pager)

## 2013-09-21 ENCOUNTER — Inpatient Hospital Stay (HOSPITAL_COMMUNITY): Payer: Medicare Other

## 2013-09-21 LAB — COMPREHENSIVE METABOLIC PANEL
ALT: 21 U/L (ref 0–35)
AST: 58 U/L — ABNORMAL HIGH (ref 0–37)
Albumin: 2 g/dL — ABNORMAL LOW (ref 3.5–5.2)
CO2: 28 mEq/L (ref 19–32)
Calcium: 8.7 mg/dL (ref 8.4–10.5)
GFR calc Af Amer: >90 mL/min (ref >90)
GFR calc non Af Amer: 86 mL/min — ABNORMAL LOW (ref >90)
Glucose, Bld: 131 mg/dL — ABNORMAL HIGH (ref 70–99)
Potassium: 3.8 mEq/L (ref 3.5–5.1)
Sodium: 138 mEq/L (ref 135–145)
Total Bilirubin: 1 mg/dL (ref 0.3–1.2)

## 2013-09-21 NOTE — Progress Notes (Signed)
[facility] Progress Note  Subjective: The patient feels better today. He is tolerating a 2 g sodium diet  Objective: Vital signs in last 24 hours: Temp:  [98 F (36.7 C)-98.6 F (37 C)] 98 F (36.7 C) (10/11 0430) Pulse Rate:  [76-86] 76 (10/11 0430) Resp:  [18] 18 (10/11 0430) BP: (97-104)/(27-50) 101/41 mmHg (10/11 0430) SpO2:  [97 %-98 %] 98 % (10/11 0430) Weight:  [123.6 kg (272 lb 7.8 oz)] 123.6 kg (272 lb 7.8 oz) (10/11 0430) Weight change: -6.3 kg (-13 lb 14.2 oz)   PE  She is in no distress  Abdomen nontender, ascites  Lab Results: No results found for this or any previous visit (from the past 24 hour(s)).  Studies/Results: @RISRSLT24 @    Assessment: Nash cirrhosis  Ascites  Plan: Continue current plans with sodium restriction, Lasix, and Aldactone    Darell Saputo F 09/21/2013, 10:35 AM

## 2013-09-21 NOTE — Progress Notes (Signed)
Physical Therapy Treatment Patient Details Name: [name] MRN: [id number] DOB: 1946-01-02 Today's Date: 09/21/2013 Time: 4098-1191 PT Time Calculation (min): 14 min  PT Assessment / Plan / Recommendation  History of Present Illness Pt admit for ascites, cirrhosis, and volume overload.   PT Comments   Patient's vertigo improved today.  Reports no episodes today or during session.  Patient does report "lightheadedness" during mobility. Noted patient holding breath.  Encouraged deep breathing during mobility.   Slow improvements continue.  Will continue vestibular assessment (ongoing) with mobility.  Follow Up Recommendations  SNF;Supervision/Assistance - 24 hour     Does the patient have the potential to tolerate intense rehabilitation     Barriers to Discharge        Equipment Recommendations  None recommended by PT    Recommendations for Other Services    Frequency Min 3X/week   Progress towards PT Goals Progress towards PT goals: Progressing toward goals  Plan Current plan remains appropriate    Precautions / Restrictions Precautions Precautions: Fall Restrictions Weight Bearing Restrictions: No   Pertinent Vitals/Pain     Mobility  Bed Mobility Bed Mobility: Rolling Right;Rolling Left;Right Sidelying to Sit;Sitting - Scoot to Edge of Bed Rolling Right: 3: Mod assist;With rail Rolling Left: 3: Mod assist;With rail Right Sidelying to Sit: 3: Mod assist;HOB flat;With rails Sitting - Scoot to Edge of Bed: 3: Mod assist Details for Bed Mobility Assistance: No nystagmus or dizziness noted with roll test or functional rolling.  Patient does report feeling "lightheaded" with rolling to right.  Patient also noted to hold her breath during mobility.  Encouraged patient to continue to breath with movement.  Patient reports no "spinning" of room today - improved. Transfers Transfers: Sit to Stand;Stand to Sit;Stand Pivot Transfers Sit to Stand: 1: +2 Total assist;With  upper extremity assist;From bed Sit to Stand: Patient Percentage: 70% Stand to Sit: 1: +2 Total assist;With upper extremity assist;To chair/3-in-1;With armrests Stand to Sit: Patient Percentage: 70% Stand Pivot Transfers: 1: +2 Total assist Stand Pivot Transfers: Patient Percentage: 70% Details for Transfer Assistance: Verbal cues for hand placement.  Encouraged breathing during movement.  Patient able to take several steps to pivot to chair. Ambulation/Gait Ambulation/Gait Assistance: Not tested (comment)      PT Goals (current goals can now be found in the care plan section)    Visit Information  Last PT Received On: 09/21/13 Assistance Needed: +2 History of Present Illness: Pt admit for ascites, cirrhosis, and volume overload.    Subjective Data  Subjective: "I'm not having the spinning today.  (Patient c/o "lightheadedness" today with mobility.)    Cognition  Cognition Arousal/Alertness: Awake/alert Behavior During Therapy: WFL for tasks assessed/performed Overall Cognitive Status: Within Functional Limits for tasks assessed    Balance  Balance Balance Assessed: Yes Static Sitting Balance Static Sitting - Balance Support: No upper extremity supported;Feet supported Static Sitting - Level of Assistance: 5: Stand by assistance Static Sitting - Comment/# of Minutes: 6 minutes.  Worked on relaxing shoulders and deep breathing. Static Standing Balance Static Standing - Balance Support: Bilateral upper extremity supported Static Standing - Level of Assistance: 3: Mod assist Static Standing - Comment/# of Minutes: 3  End of Session PT - End of Session Equipment Utilized During Treatment: Gait belt Activity Tolerance: Patient limited by fatigue Patient left: in chair;with call bell/phone within reach Nurse Communication: Mobility status (Reviewed return to bed with RN and patient)   GP     Vena Austria 09/21/2013, 4:19  PM Durenda Hurt. Renaldo Fiddler, [facility] Acute Rehab  Services Pager [phone]

## 2013-09-21 NOTE — Progress Notes (Signed)
Subjective: She feels okay. Tolerated paracentecis yesterday. No pain except right arm from the fall.   Objective: Vital signs in last 24 hours: Temp:  [98 F (36.7 C)-98.6 F (37 C)] 98 F (36.7 C) (10/11 0430) Pulse Rate:  [76-86] 76 (10/11 0430) Resp:  [18] 18 (10/11 0430) BP: (97-110)/(27-65) 101/41 mmHg (10/11 0430) SpO2:  [97 %-98 %] 98 % (10/11 0430) Weight:  [123.6 kg (272 lb 7.8 oz)] 123.6 kg (272 lb 7.8 oz) (10/11 0430) Weight change: -6.3 kg (-13 lb 14.2 oz) Last BM Date: 09/19/13  Intake/Output from previous day: 10/10 0701 - 10/11 0700 In: 540 [P.O.:540] Out: 2200 [Urine:2200] Intake/Output this shift:    General appearance: alert, cooperative and appears stated age Resp: clear to auscultation bilaterally Cardio: regular rate and rhythm, S1, S2 normal, no murmur, click, rub or gallop GI: soft, non-tender; bowel sounds normal; no masses,  no organomegaly Extremities: extremities normal, atraumatic, no cyanosis or edema Neurologic: Grossly normal   Lab Results:  Recent Labs  09/19/13 1337 09/20/13 0518  WBC 3.8* 3.3*  HGB 11.5* 10.6*  HCT 35.4* 32.8*  PLT 116* 113*   BMET  Recent Labs  09/19/13 1337 09/20/13 0518  NA 142 143  K 3.1* 3.8  CL 102 106  CO2 31 28  GLUCOSE 90 78  BUN 10 10  CREATININE 0.74 0.67  CALCIUM 8.6 8.4   CMET CMP     Component Value Date/Time   NA 143 09/20/2013 0518   K 3.8 09/20/2013 0518   CL 106 09/20/2013 0518   CO2 28 09/20/2013 0518   GLUCOSE 78 09/20/2013 0518   BUN 10 09/20/2013 0518   CREATININE 0.67 09/20/2013 0518   CALCIUM 8.4 09/20/2013 0518   PROT 5.9* 09/20/2013 0518   ALBUMIN 2.0* 09/20/2013 0518   AST 65* 09/20/2013 0518   ALT 21 09/20/2013 0518   ALKPHOS 72 09/20/2013 0518   BILITOT 1.4* 09/20/2013 0518   GFRNONAA 89* 09/20/2013 0518   GFRAA >90 09/20/2013 0518     Studies/Results: Dg Chest 2 View  09/21/2013   CLINICAL DATA:  Followup atelectasis versus pneumonia  EXAM: CHEST  2  VIEW  COMPARISON:  09/19/2013  FINDINGS: Mild cardiac enlargement. Vascular pattern is normal. There is stable small left effusion and atelectasis at the left base. Right lung is clear.  IMPRESSION: No significant change from 09/19/2013 with persistent small left effusion and left lower lobe atelectasis.   Electronically Signed   By: Esperanza Heir M.D.   On: 09/21/2013 07:57   Dg Chest 2 View  09/19/2013   CLINICAL DATA:  Shortness of breath, hypertension  EXAM: CHEST  2 VIEW  COMPARISON:  08/08/2013  FINDINGS: Moderate cardiac enlargement. Mild aortic arch calcifications. Mild vascular congestion with no evidence of pulmonary edema or consolidation. Lateral view of limited diagnostic utility due to patient body habitus. Some degree of left lower lobe retrocardiac consolidation is suspected.  IMPRESSION: Limited study. Cardiac enlargement with vascular congestion but no pulmonary edema. Focal left lower lobe consolidation suspected although this area is poorly defined.   Electronically Signed   By: Esperanza Heir M.D.   On: 09/19/2013 14:18   Ct Angio Chest W/cm &/or Wo Cm  09/19/2013   CLINICAL DATA:  Shortness of breath.  EXAM: CT ANGIOGRAPHY CHEST WITH CONTRAST  TECHNIQUE: Multidetector CT imaging of the chest was performed using the standard protocol during bolus administration of intravenous contrast. Multiplanar CT image reconstructions including MIPs were obtained to evaluate the vascular  anatomy.  CONTRAST:  OMNIPAQUE IOHEXOL 350 MG/ML SOLN  COMPARISON:  Chest radiograph 09/19/2013  FINDINGS: This is a technically good evaluation of the pulmonary arterial tree. No focal filling defects are identified in the pulmonary arteries to suggest pulmonary embolism. Evaluation of the subsegmental pulmonary artery branches in the left lower lobe is somewhat limited by collapse/consolidation of that portion of the left lower lobe.  The heart is enlarged.  There is a small left pleural effusion.  There is  a moderate-sized hiatal hernia. The wall of the herniated portion of the stomach appears thickened, but it could be accentuated by decompression.  The thoracic aorta is normal in caliber and enhancement.  No lymphadenopathy is detected in the thorax. Lung windows demonstrate collapse/ consolidation of the basilar portion of the left lower lobe. No pulmonary nodule or mass is detected. There is mild atelectasis in the right lower lobe.  The bones appear osteopenic. No acute bony abnormality is identified.  Upper abdomen: A moderate amount of abdominal ascites is seen in the upper abdomen. The liver contour appears nodular, consistent with hepatic cirrhosis. The spleen is not entirely visualized. The imaged portion appears within normal limits.  Review of the MIP images confirms the above findings.  IMPRESSION: 1. Collapse and/or consolidation of the basilar portion of the left lower lobe. Airspace disease related to infection or aspiration cannot be excluded in the left lower lobe.  2. Small left pleural effusion.  3. Moderate hiatal hernia. The herniated portion of the stomach appears to have a thickened wall, for which gastritis or malignancy cannot be excluded.  4. Hepatic cirrhosis and abdominal ascites  5. No evidence of pulmonary embolism  6. Cardiomegaly   Electronically Signed   By: Britta Mccreedy M.D.   On: 09/19/2013 18:00   US Abdomen Limited  09/19/2013   CLINICAL DATA:  Abdominal distention. Ascites.  EXAM: US ABDOMEN LIMITED  COMPARISON:  09/13/2013.  FINDINGS: Large volume of ascites is present. This is identified in all 4 quadrants.  IMPRESSION:  Large volume ascites.   Electronically Signed   By: Andreas Newport M.D.   On: 09/19/2013 15:01   US Paracentesis  09/20/2013   CLINICAL DATA:  Cirrhosis, recurrent ascites. Request is made for a therapeutic paracentesis up to 5.5 liters.  EXAM: ULTRASOUND GUIDED PARACENTESIS  PROCEDURE: An ultrasound guided paracentesis was thoroughly discussed with  the patient and questions answered. The benefits, risks, alternatives and complications were also discussed. The patient understands and wishes to proceed with the procedure. Written consent was obtained.  Ultrasound was performed to localize and Brogan England an adequate pocket of fluid in the left lower. quadrant of the abdomen. The area was then prepped and draped in the normal sterile fashion. 1% Lidocaine was used for local anesthesia. Under ultrasound guidance a 19 gauge Yueh catheter was introduced. Paracentesis was performed. The catheter was removed and a dressing applied.  COMPLICATIONS: None negative.  FINDINGS: A total of approximately 5.5 L of yellow fluid was removed. A fluid sample was not sent for laboratory analysis.  IMPRESSION: Successful ultrasound guided paracentesis yielding 5.5 L. of ascites.  Read by: Jeananne Rama, P.A.-C.   Electronically Signed   By: Richarda Overlie M.D.   On: 09/20/2013 11:23    Medications: I have reviewed the patient's current medications. Marland Kitchen aspirin EC  81 mg Oral Daily  . doxepin  75 mg Oral QHS  . feeding supplement (PRO-STAT SUGAR FREE 64)  30 mL Oral BID PC  .  FLUoxetine  40 mg Oral BID  . furosemide  60 mg Intravenous BID  . metoCLOPramide  5 mg Oral BID  . multivitamin with minerals  1 tablet Oral Daily  . nadolol  20 mg Oral Daily  . pantoprazole  40 mg Oral BID  . potassium chloride  40 mEq Oral BID  . sodium chloride  3 mL Intravenous Q12H  . sodium chloride  3 mL Intravenous Q12H  . spironolactone  12.5 mg Oral Daily     Assessment/Plan:  Principal Problem:   Ascites- continue therapy per gi and dr. Timothy Lasso. Active Problems:   Exogenous obesity-follow.   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   Shortness of breath-better. BPPV-we will see if we can have O.T. Or P.t. Do vestibular exercises. May need snf.   LOS: 2 days   Ezequiel Kayser, MD 09/21/2013, 9:29 AM

## 2013-09-22 LAB — CBC WITH DIFFERENTIAL/PLATELET
Basophils Relative: 1 % (ref 0–1)
Eosinophils Absolute: 0.1 10*3/uL (ref 0.0–0.7)
Eosinophils Relative: 3 % (ref 0–5)
HCT: 34 % — ABNORMAL LOW (ref 36.0–46.0)
Hemoglobin: 11 g/dL — ABNORMAL LOW (ref 12.0–15.0)
Lymphocytes Relative: 21 % (ref 12–46)
Lymphs Abs: 0.8 10*3/uL (ref 0.7–4.0)
MCH: 28.1 pg (ref 26.0–34.0)
MCHC: 32.4 g/dL (ref 30.0–36.0)
MCV: 87 fL (ref 78.0–100.0)
Monocytes Absolute: 0.4 10*3/uL (ref 0.1–1.0)
Monocytes Relative: 11 % (ref 3–12)
Neutro Abs: 2.5 10*3/uL (ref 1.7–7.7)
Neutrophils Relative %: 65 % (ref 43–77)
RBC: 3.91 MIL/uL (ref 3.87–5.11)

## 2013-09-22 MED ORDER — NYSTATIN 100000 UNIT/ML MT SUSP
5.0000 mL | Freq: Four times a day (QID) | OROMUCOSAL | Status: DC
  Administered 2013-09-22 – 2013-09-25 (×11): 500000 [IU] via ORAL
  Filled 2013-09-22 (×16): qty 5

## 2013-09-22 NOTE — Progress Notes (Signed)
Subjective: She feels okay today. Tongue is red.  Objective: Vital signs in last 24 hours: Temp:  [98 F (36.7 C)-98.1 F (36.7 C)] 98 F (36.7 C) (10/12 0511) Pulse Rate:  [78-82] 78 (10/12 0511) Resp:  [16-18] 16 (10/12 0511) BP: (106-128)/(36-77) 128/74 mmHg (10/12 0511) SpO2:  [100 %] 100 % (10/12 0511) Weight:  [122.8 kg (270 lb 11.6 oz)] 122.8 kg (270 lb 11.6 oz) (10/12 0511) Weight change: -0.8 kg (-1 lb 12.2 oz) Last BM Date: 09/19/13  Intake/Output from previous day: 10/11 0701 - 10/12 0700 In: 580 [P.O.:580] Out: 1025 [Urine:1025] Intake/Output this shift:    General appearance: alert, cooperative and appears stated age Resp: clear to auscultation bilaterally Cardio: regular rate and rhythm, S1, S2 normal, no murmur, click, rub or gallop GI: soft, non-tender; bowel sounds normal; no masses,  no organomegaly Extremities: legs wrapped with ace bandages and edema is less now. Neurologic: Grossly normal Morbid obesity  Lab Results:  Recent Labs  09/20/13 0518 09/22/13 0433  WBC 3.3* 3.9*  HGB 10.6* 11.0*  HCT 32.8* 34.0*  PLT 113* 107*   BMET  Recent Labs  09/20/13 0518 09/21/13 1010  NA 143 138  K 3.8 3.8  CL 106 102  CO2 28 28  GLUCOSE 78 131*  BUN 10 13  CREATININE 0.67 0.74  CALCIUM 8.4 8.7   CMET CMP     Component Value Date/Time   NA 138 09/21/2013 1010   K 3.8 09/21/2013 1010   CL 102 09/21/2013 1010   CO2 28 09/21/2013 1010   GLUCOSE 131* 09/21/2013 1010   BUN 13 09/21/2013 1010   CREATININE 0.74 09/21/2013 1010   CALCIUM 8.7 09/21/2013 1010   PROT 6.2 09/21/2013 1010   ALBUMIN 2.0* 09/21/2013 1010   AST 58* 09/21/2013 1010   ALT 21 09/21/2013 1010   ALKPHOS 73 09/21/2013 1010   BILITOT 1.0 09/21/2013 1010   GFRNONAA 86* 09/21/2013 1010   GFRAA >90 09/21/2013 1010     Studies/Results: Dg Chest 2 View  09/21/2013   CLINICAL DATA:  Followup atelectasis versus pneumonia  EXAM: CHEST  2 VIEW  COMPARISON:  09/19/2013   FINDINGS: Mild cardiac enlargement. Vascular pattern is normal. There is stable small left effusion and atelectasis at the left base. Right lung is clear.  IMPRESSION: No significant change from 09/19/2013 with persistent small left effusion and left lower lobe atelectasis.   Electronically Signed   By: Esperanza Heir M.D.   On: 09/21/2013 07:57   US Paracentesis  09/20/2013   CLINICAL DATA:  Cirrhosis, recurrent ascites. Request is made for a therapeutic paracentesis up to 5.5 liters.  EXAM: ULTRASOUND GUIDED PARACENTESIS  PROCEDURE: An ultrasound guided paracentesis was thoroughly discussed with the patient and questions answered. The benefits, risks, alternatives and complications were also discussed. The patient understands and wishes to proceed with the procedure. Written consent was obtained.  Ultrasound was performed to localize and Keiona Jenison an adequate pocket of fluid in the left lower. quadrant of the abdomen. The area was then prepped and draped in the normal sterile fashion. 1% Lidocaine was used for local anesthesia. Under ultrasound guidance a 19 gauge Yueh catheter was introduced. Paracentesis was performed. The catheter was removed and a dressing applied.  COMPLICATIONS: None negative.  FINDINGS: A total of approximately 5.5 L of yellow fluid was removed. A fluid sample was not sent for laboratory analysis.  IMPRESSION: Successful ultrasound guided paracentesis yielding 5.5 L. of ascites.  Read by: Jeananne Rama, P.A.-C.  Electronically Signed   By: Richarda Overlie M.D.   On: 09/20/2013 11:23    Medications: I have reviewed the patient's current medications. Marland Kitchen aspirin EC  81 mg Oral Daily  . doxepin  75 mg Oral QHS  . feeding supplement (PRO-STAT SUGAR FREE 64)  30 mL Oral BID PC  . FLUoxetine  40 mg Oral BID  . furosemide  60 mg Intravenous BID  . metoCLOPramide  5 mg Oral BID  . multivitamin with minerals  1 tablet Oral Daily  . nadolol  20 mg Oral Daily  . pantoprazole  40 mg Oral BID  .  potassium chloride  40 mEq Oral BID  . sodium chloride  3 mL Intravenous Q12H  . sodium chloride  3 mL Intravenous Q12H  . 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 Add nystatin mw. May need snf soon for rehab prior to return to ALF.   LOS: 3 days   Ezequiel Kayser, MD 09/22/2013, 10:34 AM

## 2013-09-22 NOTE — Progress Notes (Signed)
[facility] Progress Note  Subjective: No complaints today other than her tongue is red.  Objective: Vital signs in last 24 hours: Temp:  [98 F (36.7 C)-98.1 F (36.7 C)] 98 F (36.7 C) (10/12 0511) Pulse Rate:  [78-82] 78 (10/12 0511) Resp:  [16-18] 16 (10/12 0511) BP: (106-128)/(36-77) 128/74 mmHg (10/12 0511) SpO2:  [100 %] 100 % (10/12 0511) Weight:  [122.8 kg (270 lb 11.6 oz)] 122.8 kg (270 lb 11.6 oz) (10/12 0511) Weight change: -0.8 kg (-1 lb 12.2 oz)   PE:  She is in no distress  Abdomen is soft and nontender, ascites presentbut not tense  Lab Results: Results for orders placed during the hospital encounter of 09/19/13 (from the past 24 hour(s))  CBC WITH DIFFERENTIAL     Status: Abnormal   Collection Time    09/22/13  4:33 AM      Result Value Range   WBC 3.9 (*) 4.0 - 10.5 K/uL   RBC 3.91  3.87 - 5.11 MIL/uL   Hemoglobin 11.0 (*) 12.0 - 15.0 g/dL   HCT 16.1 (*) 09.6 - 04.5 %   MCV 87.0  78.0 - 100.0 fL   MCH 28.1  26.0 - 34.0 pg   MCHC 32.4  30.0 - 36.0 g/dL   RDW 40.9 (*) 81.1 - 91.4 %   Platelets 107 (*) 150 - 400 K/uL   Neutrophils Relative % 65  43 - 77 %   Neutro Abs 2.5  1.7 - 7.7 K/uL   Lymphocytes Relative 21  12 - 46 %   Lymphs Abs 0.8  0.7 - 4.0 K/uL   Monocytes Relative 11  3 - 12 %   Monocytes Absolute 0.4  0.1 - 1.0 K/uL   Eosinophils Relative 3  0 - 5 %   Eosinophils Absolute 0.1  0.0 - 0.7 K/uL   Basophils Relative 1  0 - 1 %   Basophils Absolute 0.0  0.0 - 0.1 K/uL    Studies/Results: @[id number] @    Assessment: Nash cirrhosis  Ascites  Plan: She seems to be doing well on her 2 g sodium diet which she will need to remain on when she goes home. I think the next step would be to switch her over to by mouth Lasix and consider increasing her dose of Aldactone. Mobilization of ascites is a very slow process. She may need when necessary paracentesis. There is not much more than I can recommend at this time. We will sign  off. she can followup with her primary gastroenterologist in our office after discharge.    Wenonah Milo F 09/22/2013, 12:01 PM

## 2013-09-22 NOTE — Clinical Social Work Psychosocial (Signed)
     Clinical Social Work Department BRIEF PSYCHOSOCIAL ASSESSMENT 09/22/2013  Patient:  [name], [name]     Account Number:  [id number]     Admit date:  09/19/2013  Clinical Social Worker:  Tiburcio Pea  Date/Time:  09/21/2013 11:00 AM  Referred by:  Physician  Date Referred:  09/21/2013 Referred for  Other - See comment   Other Referral:   From ALF- PT recommends SNF   Interview type:  Other - See comment Other interview type:   Patient- defers to her daughter [name]    PSYCHOSOCIAL DATA Living Status:  FACILITY Admitted from facility:  [facility] Level of care:  Assisted Living Primary support name:  [name]    [phone] Primary support relationship to patient:  CHILD, ADULT Degree of support available:   Strong support.  She is HCPOA    CURRENT CONCERNS Current Concerns  Post-Acute Placement   Other Concerns:   PT recommends STR    SOCIAL WORK ASSESSMENT / PLAN 67 year old female referred to CSW. [facility] at [facility]. CSW met with paeint on 09/20/13 to discuss. She stated that she really likes it there and wants to return. She asked CSW to talk to her daughter [name] as she defers all decisions to her. CSW left a message for [name] to  call and she did so on 10/11/4. She stated that she understood why PT recommended short term SNF but does not want her mother moved from [place]. She feels that it will worsen her confusion and she has spoken to facility and they feel they can manage patient's care at the facility.  She wants home health to come to see patient. CSW will discuss wtih MD and place FL2 on chart for review.   Assessment/plan status:  Psychosocial Support/Ongoing Assessment of Needs Other assessment/ plan:   Information/referral to community resources:   None at this time. CSW discussed benefits of short term SNF with daughter but she declines. Does not feel it is in the best interest of her mother.     PATIENTS/FAMILYS RESPONSE TO PLAN OF CARE: Patient is alert and oriented but admits that she gets "mixed up easily".  very pleasant lady who states that she really enjoys her "home" at [facility] and wants to return there. Patient's daughter is very involved and supportive. She feels very strongly that moving her mother to a SNF would be detrimental to her recovery and asked for return to [facility]. CSW will contact facility Monday to insure that they can manage patient if she returns to facility. [name], LCSWA  [phone]

## 2013-09-22 NOTE — Plan of Care (Signed)
Problem: Phase I Progression Outcomes Goal: EF % per last Echo/documented,Core Reminder form on chart Outcome: Completed/Met Date Met:  09/22/13 55-65%(9-14)

## 2013-09-22 NOTE — Progress Notes (Signed)
Late Entry:  CSW received a call from patient's daughter Barrie Dunker- who stated that understands that PT is recommending SNF placement for her mother but she absolutely insists that her mother return to [facility] with home health. She has spoken to the facility and they can manage her care there. Daughter will not allow SNF placement and states she feels her mother would only become more confused there. CSW will facilitate d/c back to ALF when medically stable per MD.  Lupita Leash T. West Pugh  [phone]

## 2013-09-23 LAB — CBC WITH DIFFERENTIAL/PLATELET
Basophils Absolute: 0 10*3/uL (ref 0.0–0.1)
Basophils Relative: 0 % (ref 0–1)
Eosinophils Absolute: 0.2 10*3/uL (ref 0.0–0.7)
Eosinophils Relative: 4 % (ref 0–5)
HCT: 36.2 % (ref 36.0–46.0)
MCH: 28.6 pg (ref 26.0–34.0)
MCHC: 32.6 g/dL (ref 30.0–36.0)
MCV: 87.9 fL (ref 78.0–100.0)
Monocytes Absolute: 0.6 10*3/uL (ref 0.1–1.0)
Platelets: 128 10*3/uL — ABNORMAL LOW (ref 150–400)
RBC: 4.12 MIL/uL (ref 3.87–5.11)
RDW: 18.6 % — ABNORMAL HIGH (ref 11.5–15.5)

## 2013-09-23 MED ORDER — ENOXAPARIN SODIUM 40 MG/0.4ML ~~LOC~~ SOLN
40.0000 mg | SUBCUTANEOUS | Status: DC
  Administered 2013-09-23 – 2013-09-24 (×2): 40 mg via SUBCUTANEOUS
  Filled 2013-09-23 (×3): qty 0.4

## 2013-09-23 MED ORDER — FUROSEMIDE 40 MG PO TABS
60.0000 mg | ORAL_TABLET | Freq: Every day | ORAL | Status: DC
  Administered 2013-09-23 – 2013-09-25 (×3): 60 mg via ORAL
  Filled 2013-09-23 (×3): qty 1

## 2013-09-23 MED ORDER — SPIRONOLACTONE 25 MG PO TABS
25.0000 mg | ORAL_TABLET | Freq: Every day | ORAL | Status: DC
  Administered 2013-09-23 – 2013-09-25 (×3): 25 mg via ORAL
  Filled 2013-09-23 (×3): qty 1

## 2013-09-23 NOTE — Progress Notes (Signed)
Report giving to receiving RN. Patient is stable, with no verbal complaint and no signs or symptoms of distress or discomfort.  

## 2013-09-23 NOTE — Progress Notes (Addendum)
Subjective: She feels okay today. Tongue is red. She has lost 17 Pounds Sat in chair 4 hrs plus yesterday. Foley still in. We also discussed her mild positional Dizziness. No new c/o Feels better than admit.   Objective: Vital signs in last 24 hours: Temp:  [98.1 F (36.7 C)-98.2 F (36.8 C)] 98.2 F (36.8 C) (10/13 0510) Pulse Rate:  [74-85] 85 (10/13 0510) Resp:  [17-20] 20 (10/13 0510) BP: (102-121)/(40-52) 102/40 mmHg (10/13 0510) SpO2:  [93 %-97 %] 94 % (10/13 0510) Weight:  [122.3 kg (269 lb 10 oz)] 122.3 kg (269 lb 10 oz) (10/13 0510) Weight change: -0.5 kg (-1 lb 1.6 oz) Last BM Date: 09/22/13  Intake/Output from previous day: 10/12 0701 - 10/13 0700 In: 840 [P.O.:840] Out: 900 [Urine:900] Intake/Output this shift:    General appearance: alert, cooperative and appears stated age Resp: clear to auscultation bilaterally Cardio: regular rate and rhythm, S1, S2 normal, no murmur, click, rub or gallop GI: soft, non-tender; bowel sounds normal; no masses,  no organomegaly.  + Fluid wave but not tight. Extremities: legs wrapped with ace bandages and edema is less now. Neurologic: Grossly normal Morbid obesity  Lab Results:  Recent Labs  09/22/13 0433 09/23/13 0428  WBC 3.9* 5.3  HGB 11.0* 11.8*  HCT 34.0* 36.2  PLT 107* 128*   BMET  Recent Labs  09/21/13 1010  NA 138  K 3.8  CL 102  CO2 28  GLUCOSE 131*  BUN 13  CREATININE 0.74  CALCIUM 8.7   CMET CMP     Component Value Date/Time   NA 138 09/21/2013 1010   K 3.8 09/21/2013 1010   CL 102 09/21/2013 1010   CO2 28 09/21/2013 1010   GLUCOSE 131* 09/21/2013 1010   BUN 13 09/21/2013 1010   CREATININE 0.74 09/21/2013 1010   CALCIUM 8.7 09/21/2013 1010   PROT 6.2 09/21/2013 1010   ALBUMIN 2.0* 09/21/2013 1010   AST 58* 09/21/2013 1010   ALT 21 09/21/2013 1010   ALKPHOS 73 09/21/2013 1010   BILITOT 1.0 09/21/2013 1010   GFRNONAA 86* 09/21/2013 1010   GFRAA >90 09/21/2013 1010      Studies/Results: No results found.  Medications: I have reviewed the patient's current medications. Marland Kitchen aspirin EC  81 mg Oral Daily  . doxepin  75 mg Oral QHS  . enoxaparin (LOVENOX) injection  40 mg Subcutaneous Q24H  . feeding supplement (PRO-STAT SUGAR FREE 64)  30 mL Oral BID PC  . FLUoxetine  40 mg Oral BID  . furosemide  60 mg Oral Daily  . metoCLOPramide  5 mg Oral BID  . multivitamin with minerals  1 tablet Oral Daily  . nadolol  20 mg Oral Daily  . nystatin  5 mL Oral QID  . pantoprazole  40 mg Oral BID  . potassium chloride  40 mEq Oral BID  . sodium chloride  3 mL Intravenous Q12H  . sodium chloride  3 mL Intravenous Q12H  . spironolactone  25 mg Oral Daily     Assessment/Plan:     Ascites due to Liver failure/Cirrhosis from NASH-improved with meds and paracentecis.  D/c Foley.  Move IV lasix to oral.  Attempt to increase Aldactone.  Check labs tom am.  Consider d/c to ALF tom am.  Will need Dr Madilyn Fireman Outpt follow up.  They need to discuss Liver Transplant possibilities.  I am currently in favor of med management only.  They also need to talk @ the thick stomach  on CTPA but she did have EGD in the Spring.  Continue BB.  Ammonia low so Lactulose not needed.  SBP proph to be considered per GI as outpt - they have signed off. Albumin Low at 2.0. AST elevated at 65-80. INR 1.4. Leukopenia and Thrombocytopenia C/W Cirrhosis.  Cr is tolerating the fluid shifts.  Will not tap again and may consider as outpatient.  Check BMET tom am.    Exogenous Morbid obesity but c Protein Cal malnutrition - Per Nutrition recs.  2 gm low Na diet. -follow   Dizzy - Vertigo due to lowish BP and sig Vol shift.  Monitor.  Vestibular PT discussed.   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  No Clinical PNA. Red Tongue - B12 high = great. Nystatin for possible Thrush?. Adult failure to thrive -She wants to return to ALF and not go to SNF.  Will work on for tomorrow. DVT -  Squeezers/Lovenox. Depression - Mood stable on meds.    LOS: 4 days   Gwen Pounds, MD 09/23/2013, 8:07 AM

## 2013-09-23 NOTE — Progress Notes (Signed)
Physical Therapy Treatment Patient Details Name: RAMAYA GUILE MRN: [id number] DOB: August 21, 1946 Today's Date: 09/23/2013 Time: 1308-6578 PT Time Calculation (min): 38 min  PT Assessment / Plan / Recommendation  History of Present Illness Pt admit for ascites, cirrhosis, and volume overload.   PT Comments   Pt admitted with above. Pt currently with functional limitations due to continued balance and endurance deficits. Progressed ambulation distance today.  Continues to have vertigo as well with treatment for L horizontal canal BPPV today.  Pt will benefit from skilled PT to increase their independence and safety with mobility to allow discharge to the venue listed below.   Follow Up Recommendations  SNF;Supervision/Assistance - 24 hour however pt wants HHPT at A living.                   Equipment Recommendations  None recommended by PT        Frequency Min 3X/week   Progress towards PT Goals Progress towards PT goals: Progressing toward goals  Plan Current plan remains appropriate    Precautions / Restrictions Precautions Precautions: Fall Restrictions Weight Bearing Restrictions: No   Pertinent Vitals/Pain VSS, No pain    Mobility  Bed Mobility Bed Mobility: Rolling Right;Rolling Left;Right Sidelying to Sit;Sitting - Scoot to [place] of Bed Rolling Right: 4: Min assist;With rail Rolling Left: 4: Min assist;With rail Right Sidelying to Sit: 3: Mod assist;With rails;HOB elevated Sitting - Scoot to [place] of Bed: 4: Min assist Details for Bed Mobility Assistance: Tested for horizontal nystagmus with positive test for left horizontal nystagmus.  Pt treated via left Appiani maneuver.  Pt without any nystagmus or dizziness after treatment.  will continue to assess for vertigo each treatment.   Transfers Transfers: Sit to Stand;Stand to Sit Sit to Stand: 1: +2 Total assist;With upper extremity assist;From bed Sit to Stand: Patient Percentage: 70% Stand to Sit: 1: +2 Total  assist;With upper extremity assist;To chair/3-in-1;With armrests Stand to Sit: Patient Percentage: 80% Stand Pivot Transfers: Not tested (comment) Details for Transfer Assistance: Verbal cues for hand placement with pt placing one hand on RW and one on chair for sit to stand.    Encouraged breathing during movement. Ambulation/Gait Ambulation/Gait Assistance: 1: +2 Total assist Ambulation/Gait: Patient Percentage: 80% Ambulation Distance (Feet): 45 Feet (6 feet, 15 feet and 24 feet with 5 min rest between walks) Assistive device: Rolling walker Ambulation/Gait Assistance Details: Pt ambulated with RW with seated rest breaks in between walks.  Needed incr cues to stand upright as well as for sequencing steps and RW.  Asssit to steer RW as well.   Gait Pattern: Step-to pattern;Decreased stride length;Decreased step length - right;Decreased step length - left;Shuffle;Wide base of support;Trunk flexed Gait velocity: decreased Stairs: No Wheelchair Mobility Wheelchair Mobility: No     PT Goals (current goals can now be found in the care plan section)    Visit Information  Last PT Received On: 09/23/13 Assistance Needed: +2 History of Present Illness: Pt admit for ascites, cirrhosis, and volume overload.    Subjective Data  Subjective: "I walked alot this weekend."   Cognition  Cognition Arousal/Alertness: Awake/alert Behavior During Therapy: WFL for tasks assessed/performed Overall Cognitive Status: Within Functional Limits for tasks assessed    Balance  Balance Balance Assessed: Yes Static Sitting Balance Static Sitting - Balance Support: No upper extremity supported;Feet supported Static Sitting - Level of Assistance: 5: Stand by assistance Static Sitting - Comment/# of Minutes: 5 minutes Static Standing Balance Static Standing - Balance Support:  Bilateral upper extremity supported;During functional activity Static Standing - Level of Assistance: 4: Min assist Static Standing  - Comment/# of Minutes: 4 minutes with RW with assist for stability secondary to weakness bil knees.   End of Session PT - End of Session Equipment Utilized During Treatment: Gait belt Activity Tolerance: Patient limited by fatigue Patient left: in chair;with call bell/phone within reach Nurse Communication: Mobility status        INGOLD,Arretta Toenjes 09/23/2013, 3:31 PM [facility] [phone] (pager)

## 2013-09-24 LAB — CBC WITH DIFFERENTIAL/PLATELET
Basophils Absolute: 0 10*3/uL (ref 0.0–0.1)
Basophils Relative: 1 % (ref 0–1)
Eosinophils Absolute: 0.1 10*3/uL (ref 0.0–0.7)
Eosinophils Relative: 3 % (ref 0–5)
Lymphocytes Relative: 22 % (ref 12–46)
MCHC: 32.2 g/dL (ref 30.0–36.0)
Monocytes Absolute: 0.5 10*3/uL (ref 0.1–1.0)
Neutro Abs: 2.6 10*3/uL (ref 1.7–7.7)
Neutrophils Relative %: 63 % (ref 43–77)
Platelets: 108 10*3/uL — ABNORMAL LOW (ref 150–400)
RDW: 18.5 % — ABNORMAL HIGH (ref 11.5–15.5)
WBC: 4 10*3/uL (ref 4.0–10.5)

## 2013-09-24 LAB — BASIC METABOLIC PANEL
CO2: 26 mEq/L (ref 19–32)
Calcium: 8.3 mg/dL — ABNORMAL LOW (ref 8.4–10.5)
Chloride: 106 mEq/L (ref 96–112)
Creatinine, Ser: 0.77 mg/dL (ref 0.50–1.10)
GFR calc Af Amer: >90 mL/min (ref >90)
GFR calc non Af Amer: 85 mL/min — ABNORMAL LOW (ref >90)
Sodium: 138 mEq/L (ref 135–145)

## 2013-09-24 MED ORDER — SPIRONOLACTONE 25 MG PO TABS: 25.0000 mg | ORAL_TABLET | Freq: Every day | ORAL | Status: DC

## 2013-09-24 MED ORDER — DILTIAZEM HCL 60 MG PO TABS: 30.0000 mg | ORAL_TABLET | Freq: Two times a day (BID) | ORAL | Status: DC

## 2013-09-24 MED ORDER — FUROSEMIDE 40 MG PO TABS: 60.0000 mg | ORAL_TABLET | Freq: Every day | ORAL | Status: DC

## 2013-09-24 MED ORDER — BOOST PLUS PO LIQD: 237.0000 mL | ORAL | Status: DC | PRN

## 2013-09-24 MED ORDER — METOCLOPRAMIDE HCL 5 MG PO TABS: 5.0000 mg | ORAL_TABLET | Freq: Two times a day (BID) | ORAL | Status: DC | PRN

## 2013-09-24 MED ORDER — PRO-STAT SUGAR FREE PO LIQD: ORAL | Status: DC

## 2013-09-24 MED ORDER — ACETAMINOPHEN 325 MG PO TABS: 325.0000 mg | ORAL_TABLET | Freq: Four times a day (QID) | ORAL | Status: DC | PRN

## 2013-09-24 NOTE — Progress Notes (Signed)
Palliative Medicine Team consult request received- writer spoke with Dr Timothy Lasso who indicated patient and family are requesting a Palliative Care Consult with [facility] to be arranged once she is back at [facility]; Dr Timothy Lasso requests this be facilitated by PMT, and asks SW place this request (for [facility] consult at facility) on [facility]; spoke with patient at bedside and left information with [facility] contact number for family and or facility follow up after discharge- per notes discharge is planned for tomorrow, Wednesday-  Writer will contact SW in the morning to confirm above request  Valente David, RN 09/24/2013, 5:21 PM Palliative Medicine Team RN Liaison [phone]

## 2013-09-24 NOTE — Progress Notes (Signed)
Palliative care note appreciated and CSW will add Palliative Care consult onto the Fl2.  Corrections made to Fl2 to show no foley and and Palliative Care consult. DC planned for today fell through after CSW sent updated Fl2 and d/c summary to Burnadette Peter at [facility]. She felt that she needed to come and see patient prior to accepting her back and wanted to talk to Dr. Timothy Lasso and patient's daughter Barrie Dunker prior to accepting patient back.  Plan d/c back to facility tomorrow; daughter will transport.  Will sent updated FL2 with patient to facility.  Lorri Frederick. [place]  [phone]

## 2013-09-24 NOTE — Progress Notes (Signed)
Notified patient that [facility] will need to reassess patient prior to coming back to facility. Daughter arrived and explained to patient more in detail why she will go to [facility] tomorrow. Notified oncoming nurse. Nurse signing off at this time.

## 2013-09-24 NOTE — Progress Notes (Signed)
Physical Therapy Treatment Patient Details Name: [name] MRN: [id number] DOB: December 26, 1945 Today's Date: 09/24/2013 Time: 0865-7846 PT Time Calculation (min): 34 min  PT Assessment / Plan / Recommendation  History of Present Illness Pt admit for ascites, cirrhosis, and volume overload.   PT Comments   Pt admitted with above. Pt currently with functional limitations due to continued deficits in vertigo, balance and endurance.  Pt will benefit from skilled PT to increase their independence and safety with mobility to allow discharge to the venue listed below.   Follow Up Recommendations  Supervision/Assistance - 24 hour;Home health PT;SNF (States she is going to A living with HHPT and not SNF.)                 Equipment Recommendations  None recommended by PT        Frequency Min 3X/week   Progress towards PT Goals Progress towards PT goals: Progressing toward goals  Plan Current plan remains appropriate    Precautions / Restrictions Precautions Precautions: Fall Restrictions Weight Bearing Restrictions: No   Pertinent Vitals/Pain VSS, No pain    Mobility  Bed Mobility Bed Mobility: Rolling Right;Rolling Left;Right Sidelying to Sit;Sitting - Scoot to [place] of Bed Rolling Right: 4: Min assist;With rail Rolling Left: 4: Min assist;With rail Right Sidelying to Sit: 3: Mod assist;With rails;HOB elevated Sitting - Scoot to [place] of Bed: 4: Min assist Details for Bed Mobility Assistance: Tested for horizontal nystagmus with negative test.  Today, pt again with left posterior canal BPPV.  Treated for left posterior canal BPPV with difficulty with treatment as pt dry heaving and nauseous with treatment.  Positioning during treatment also difficult as PT using bed to assist with positioning due to patient's girth.  Performed treatment x2.  Educated in [place] exercise for pt to clear BPPV.  Gave handout.   Transfers Transfers: Sit to Stand;Stand to Sit;Stand Pivot  Transfers Sit to Stand: 1: +2 Total assist;With upper extremity assist;From bed Sit to Stand: Patient Percentage: 80% Stand to Sit: 1: +2 Total assist;With upper extremity assist;To chair/3-in-1;With armrests Stand to Sit: Patient Percentage: 80% Stand Pivot Transfers: 4: Min assist Details for Transfer Assistance: Verbal cues for hand placement with pt placing one hand on RW and one on chair for sit to stand.    Encouraged breathing during movement.Pt performed stand pivot to 3N1 as she had to urinate.   Ambulation/Gait Ambulation/Gait Assistance: 4: Min assist Ambulation Distance (Feet): 10 Feet Assistive device: Rolling walker Ambulation/Gait Assistance Details: Only ambulated from 3N1 to recliner due to nausea after treatment.   Gait Pattern: Step-to pattern;Decreased stride length;Decreased step length - right;Decreased step length - left;Shuffle;Wide base of support;Trunk flexed Gait velocity: decreased Stairs: No Wheelchair Mobility Wheelchair Mobility: No    Exercises Other Exercises Other Exercises: [place] exercise    PT Goals (current goals can now be found in the care plan section)    Visit Information  Last PT Received On: 09/24/13 Assistance Needed: +2 History of Present Illness: Pt admit for ascites, cirrhosis, and volume overload.    Subjective Data  Subjective: "I have been dizzy again."   Cognition  Cognition Arousal/Alertness: Awake/alert Behavior During Therapy: WFL for tasks assessed/performed Overall Cognitive Status: Within Functional Limits for tasks assessed    Balance  Static Sitting Balance Static Sitting - Balance Support: No upper extremity supported;Feet supported Static Sitting - Level of Assistance: 5: Stand by assistance Static Sitting - Comment/# of Minutes: 3 Static Standing Balance Static Standing -  Balance Support: Bilateral upper extremity supported;During functional activity Static Standing - Level of Assistance: 4: Min  assist Static Standing - Comment/# of Minutes: 4 min with RW to be cleaned after urinating.    End of Session PT - End of Session Equipment Utilized During Treatment: Gait belt Activity Tolerance: Patient limited by fatigue Patient left: in chair;with call bell/phone within reach Nurse Communication: Mobility status        INGOLD,Zacchaeus Halm 09/24/2013, 11:56 AM [facility] [phone] (pager)

## 2013-09-24 NOTE — Discharge Summary (Signed)
Physician Discharge Summary  DISCHARGE SUMMARY   Patient ID: [name] MR#: [id number] DOB/AGE: [birth year] 67 y.o.   Attending Physician:Timia Casselman M  [name],[name], MD  Consults:Treatment Team:  Graylin Shiver, MD**  Admit date: 09/19/2013 Discharge date: 09/24/2013  Discharge Diagnoses:  Principal Problem:   Ascites Active Problems:   Exogenous obesity   Cirrhosis   Shortness of breath   Patient Active Problem List   Diagnosis Date Noted  . Cirrhosis 09/19/2013  . Ascites 09/19/2013  . Shortness of breath 09/19/2013  . GI bleed 04/22/2013  . Anemia 04/21/2013  . Falls frequently 04/21/2013  . Exogenous obesity   . Anxiety and depression   . Hiatal hernia   . Osteoarthritis   . Atypical chest pain   . Memory changes   . Bipolar disorder   . Benign hypertensive heart disease without heart failure 11/09/2011  . Chest pain 11/09/2011  . Anxiety 11/09/2011   Past Medical History  Diagnosis Date  . Exogenous obesity   . Hypertension   . Anxiety and depression     chronic  . Hiatal hernia   . Atypical chest pain 06/10/03    normal 2 day adenosine cardiolite  . Memory changes   . Bipolar disorder   . Ascites   . Cirrhosis   . Anginal pain   . Exertional shortness of breath   . Shortness of breath     "all the time right now" (09/19/2013)  . Anemia   . History of blood transfusion 05/2013    "once; related to a stomach ulcer" (09/19/2013)  . Bleeding stomach ulcer 04/22/2013  . GERD (gastroesophageal reflux disease)   . Osteoarthritis   . Arthritis     "all over my body" (09/19/2013)  . Anxiety   . Depression   . Fatty liver disease, nonalcoholic     Discharged Condition: Better  Discharge Medications:   Medication List         acetaminophen 325 MG tablet  Commonly known as:  TYLENOL  Take 1 tablet (325 mg total) by mouth every 6 (six) hours as needed for pain or fever.     albuterol (2.5 MG/3ML) 0.083% nebulizer solution   Commonly known as:  PROVENTIL  Take 2.5 mg by nebulization 4 (four) times daily as needed for wheezing.     aspirin EC 81 MG tablet  Take 81 mg by mouth daily.     CERTA-VITE PO  Take 1 tablet by mouth daily.     diltiazem 60 MG tablet  Commonly known as:  CARDIZEM  Take 0.5 tablets (30 mg total) by mouth 2 (two) times daily.     docusate sodium 100 MG capsule  Commonly known as:  COLACE  Take 100 mg by mouth daily as needed for constipation.     doxepin 75 MG capsule  Commonly known as:  SINEQUAN  Take 75 mg by mouth at bedtime.     feeding supplement (PRO-STAT SUGAR FREE 64) Liqd  Take 30 mLs by mouth once per day after breakfast and offer one post dinner.     FLUoxetine 40 MG capsule  Commonly known as:  PROZAC  Take 40 mg by mouth 2 (two) times daily. Per Dr.Pulis     furosemide 40 MG tablet  Commonly known as:  LASIX  Take 1.5 tablets (60 mg total) by mouth daily.     lactose free nutrition Liqd  Take 237 mLs by mouth as needed (Please offer to pt if  she misses or refuses a meal).     LORazepam 0.5 MG tablet  Commonly known as:  ATIVAN  Take 0.5 mg by mouth 2 (two) times daily as needed for anxiety.     Melatonin 3 MG Caps  Take 1 capsule by mouth at bedtime.     metoCLOPramide 5 MG tablet  Commonly known as:  REGLAN  Take 1 tablet (5 mg total) by mouth 2 (two) times daily as needed (Nausea).     nadolol 20 MG tablet  Commonly known as:  CORGARD  Take 20 mg by mouth daily.     nitroGLYCERIN 0.4 MG SL tablet  Commonly known as:  NITROSTAT  Place 0.4 mg under the tongue every 5 (five) minutes as needed for chest pain.     pantoprazole 40 MG tablet  Commonly known as:  PROTONIX  Take 40 mg by mouth 2 (two) times daily.     polyethylene glycol packet  Commonly known as:  MIRALAX / GLYCOLAX  Take 17 g by mouth daily as needed (constipation).     spironolactone 25 MG tablet  Commonly known as:  ALDACTONE  Take 1 tablet (25 mg total) by mouth daily.         Hospital Procedures: Dg Chest 2 View  09/21/2013   CLINICAL DATA:  Followup atelectasis versus pneumonia  EXAM: CHEST  2 VIEW  COMPARISON:  09/19/2013  FINDINGS: Mild cardiac enlargement. Vascular pattern is normal. There is stable small left effusion and atelectasis at the left base. Right lung is clear.  IMPRESSION: No significant change from 09/19/2013 with persistent small left effusion and left lower lobe atelectasis.   Electronically Signed   By: Esperanza Heir M.D.   On: 09/21/2013 07:57   Dg Chest 2 View  09/19/2013   CLINICAL DATA:  Shortness of breath, hypertension  EXAM: CHEST  2 VIEW  COMPARISON:  08/08/2013  FINDINGS: Moderate cardiac enlargement. Mild aortic arch calcifications. Mild vascular congestion with no evidence of pulmonary edema or consolidation. Lateral view of limited diagnostic utility due to patient body habitus. Some degree of left lower lobe retrocardiac consolidation is suspected.  IMPRESSION: Limited study. Cardiac enlargement with vascular congestion but no pulmonary edema. Focal left lower lobe consolidation suspected although this area is poorly defined.   Electronically Signed   By: Esperanza Heir M.D.   On: 09/19/2013 14:18   Ct Angio Chest W/cm &/or Wo Cm  09/19/2013   CLINICAL DATA:  Shortness of breath.  EXAM: CT ANGIOGRAPHY CHEST WITH CONTRAST  TECHNIQUE: Multidetector CT imaging of the chest was performed using the standard protocol during bolus administration of intravenous contrast. Multiplanar CT image reconstructions including MIPs were obtained to evaluate the vascular anatomy.  CONTRAST:  OMNIPAQUE IOHEXOL 350 MG/ML SOLN  COMPARISON:  Chest radiograph 09/19/2013  FINDINGS: This is a technically good evaluation of the pulmonary arterial tree. No focal filling defects are identified in the pulmonary arteries to suggest pulmonary embolism. Evaluation of the subsegmental pulmonary artery branches in the left lower lobe is somewhat limited by  collapse/consolidation of that portion of the left lower lobe.  The heart is enlarged.  There is a small left pleural effusion.  There is a moderate-sized hiatal hernia. The wall of the herniated portion of the stomach appears thickened, but it could be accentuated by decompression.  The thoracic aorta is normal in caliber and enhancement.  No lymphadenopathy is detected in the thorax. Lung windows demonstrate collapse/ consolidation of the basilar portion of  the left lower lobe. No pulmonary nodule or mass is detected. There is mild atelectasis in the right lower lobe.  The bones appear osteopenic. No acute bony abnormality is identified.  Upper abdomen: A moderate amount of abdominal ascites is seen in the upper abdomen. The liver contour appears nodular, consistent with hepatic cirrhosis. The spleen is not entirely visualized. The imaged portion appears within normal limits.  Review of the MIP images confirms the above findings.  IMPRESSION: 1. Collapse and/or consolidation of the basilar portion of the left lower lobe. Airspace disease related to infection or aspiration cannot be excluded in the left lower lobe.  2. Small left pleural effusion.  3. Moderate hiatal hernia. The herniated portion of the stomach appears to have a thickened wall, for which gastritis or malignancy cannot be excluded.  4. Hepatic cirrhosis and abdominal ascites  5. No evidence of pulmonary embolism  6. Cardiomegaly   Electronically Signed   By: Britta Mccreedy M.D.   On: 09/19/2013 18:00   US Abdomen Limited  09/19/2013   CLINICAL DATA:  Abdominal distention. Ascites.  EXAM: US ABDOMEN LIMITED  COMPARISON:  09/13/2013.  FINDINGS: Large volume of ascites is present. This is identified in all 4 quadrants.  IMPRESSION:  Large volume ascites.   Electronically Signed   By: Andreas Newport M.D.   On: 09/19/2013 15:01   US Paracentesis  09/20/2013   CLINICAL DATA:  Cirrhosis, recurrent ascites. Request is made for a therapeutic  paracentesis up to 5.5 liters.  EXAM: ULTRASOUND GUIDED PARACENTESIS  PROCEDURE: An ultrasound guided paracentesis was thoroughly discussed with the patient and questions answered. The benefits, risks, alternatives and complications were also discussed. The patient understands and wishes to proceed with the procedure. Written consent was obtained.  Ultrasound was performed to localize and mark an adequate pocket of fluid in the left lower. quadrant of the abdomen. The area was then prepped and draped in the normal sterile fashion. 1% Lidocaine was used for local anesthesia. Under ultrasound guidance a 19 gauge Yueh catheter was introduced. Paracentesis was performed. The catheter was removed and a dressing applied.  COMPLICATIONS: None negative.  FINDINGS: A total of approximately 5.5 L of yellow fluid was removed. A fluid sample was not sent for laboratory analysis.  IMPRESSION: Successful ultrasound guided paracentesis yielding 5.5 L. of ascites.  Read by: Jeananne Rama, P.A.-C.   Electronically Signed   By: Richarda Overlie M.D.   On: 09/20/2013 11:23   US Paracentesis  09/13/2013   *RADIOLOGY REPORT*  Clinical Data: Ascites  ULTRASOUND GUIDED PARACENTESIS  An ultrasound guided paracentesis was thoroughly discussed with the patient and questions answered.  The benefits, risks, alternatives and complications were also discussed.  The patient understands and wishes to proceed with the procedure.  Written consent was obtained.  Ultrasound was performed to localize and mark an adequate pocket of fluid in the left lower quadrant of the abdomen.  The area was then prepped and draped in the normal sterile fashion.  1% Lidocaine was used for local anesthesia.  Under ultrasound guidance a 19 gauge Yueh catheter was introduced.  Paracentesis was performed.  The catheter was removed and a dressing applied.  Complications:  none  Findings:  A total of approximately 3.5 liters of clear yellow fluid was removed.  A fluid sample  was sent for laboratory analysis.  IMPRESSION: Successful ultrasound guided paracentesis yielding 3.5 liters of ascites.  Read By: Pattricia Boss PA-C   Original Report Authenticated By: Simonne Come  V, MD    History of Present Illness:  41 F pt c known Cirrhosis from NASH S/p prior 3.5 L Paracentesis 1 week prior to admission c (-) Cx and (-) Cytology and also maintained on chronic Diuretic therapy. Recent ECHO showed EF 55-65%, Mild LVH, Mild AS. She resides AL due to AFTT. The AL called the morning of admit with worsening LE Edema, Weaping, tense abdomen, Increasing SOB. We sent her to the ED. In ED CXR C/w Cardiac enlargement with vascular congestion but no pulmonary edema. Focal left lower lobe consolidation suspected although this area is poorly defined. EDP suspected HCAP and Multiple Abx given despite no Fever or WBC or cough or sputum. Ab US showed Large volume of ascites is present. This is identified in all 4 quadrants. I was called for inpt admission. D dimer was high so CTPA ordered and came back (-) for PE - I suspect no PNA but ATX due to pressure from the Volume. Will need to get GI involved to help me treat her.  09/06/13 ! Ammonia, Plasma [H] 95 ug/dL 16-10  Pt looked fine and was not hypoxic.  No clinical PNA.  All Volume issues. Admitted for eval and treatment.  Hospital Course: Admitted for Acute on chronic Liver Failure issues c Ascites and Volume Overload. Foley was placed and she was started on aggressive IV Diuresis. 5.5 liters Paracentesis done on 09/20/13. Dr Maylene Roes. Beta blocker continued. Aldactone started. No Asterixis, no Confusion, No Angiomata.  2Gm Na Diet c Increased protein recommended. We will monitor weight and would like [facility] to weight her every other day and inform me wheneve she gains > 3 #s in 2 days or her weight goes up over 275#.  Weight on admission was @ 286# and weight today on D/C is 268  09/20/13 285 lb 11.5 oz (129.6 kg)  08/08/13  302 lb (136.986 kg)  04/22/13 280 lb (127.007 kg)  04/22/13 280 lb (127.007 kg)  05/17/12 270 lb (122.471 kg)  11/09/11 256 lb (116.121 kg)   Her diuretics have been changed to PO.  Her BP and Kidney function has tolerated.  Her SOB is better.  She is ready for D/C on 10/14.  All of her questions and Tamara's questions have been answered.  Below is the recommendations from Nutrition:  Encouraged healthful eating and incorporation of protein-rich foods at each meal  Provide Multivitamin with minerals daily  Provide Pro-stat BID - She did not like it so we will definitely do once per day with breakfaxt and try to get the second dosing in after dinner.  Pt may benefit from additional supplements: Vitamin B-complex supplement and water-soluble forms of Vitamin A, D, E, and K  Provide Boost Plus as needed (for missed meals)  She feels much better today than admission.  No Clinical PNA.Tongue is red/B12 is fine.  No Thrush seen.  She has lost 18+ Pounds since admission and Ab is softer and she has less LE Edema.  We will continue Gentiva and continue LE wraps.  They were removed today so she could shower. Sat in chair 4 hrs plus yesterday. Walked the room and the halls.  Originally the rec was to go to SNF and then back to Al.  I agree with them to go to [facility] and get PT/OT there. Foley removed yesterday. She has mild positional Dizziness attributed to inner ear and rapid fluctuation of her Volume.  She should use her walker and maintain safety as this should  improve quickly. I did discuss her case with Dr [place] - her Eye doc and updated her on her liver issues.  The liver issues may be contributing to her eye issues.  She has had a CCT 04/21/13: No evidence of acute intracranial abnormality. 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  The cerebellar findings on that CCT may also contribute to some dizzyness?  No new c/o and she is ready for D/C.  Feels better than admit.  Main  issue:  Ascites due to Liver failure/Cirrhosis from NASH - improved with meds and paracentecis. Continue Lasix and Aldactone monitoring Creatinine and weight and Sxs.  Will need Dr Madilyn Fireman Outpt follow up. They need to discuss Liver Transplant possibilities. I am currently in favor of med management only. They also need to talk @ the thick stomach on CTPA but she did have EGD in the Spring. Continue BB. Ammonia low so Lactulose not needed. SBP proph to be considered per GI as outpt. Albumin Low at 2.0. AST elevated at 58-80. INR 1.4. Leukopenia and Thrombocytopenia C/W Cirrhosis. Cr is tolerating the fluid shifts. Will not tap again and may consider as outpatient.   Exogenous Morbid obesity but c Protein Cal malnutrition - Follow Nutrition recs. 2 gm low Na diet. -follow  Dizzy - Vertigo due to lowish BP and sig Vol shift. Monitor. Vestibular PT discussed.  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 No Clinical PNA. Repeat CXR not significant.  No WBC issues, No Hypoxia, No Cough, No Fever. Red Tongue - B12 high = great. No Thrush seen.  Adult failure to thrive -She wants to return to ALF and not go to SNF. Will D/c c HH Services in place.  DVT - Squeezers/Lovenox provided.  Depression - Mood stable on meds. Mild Anemia c Hbg 10.9 as she had GIB due to Antral Ulcers in 04/2013 - No Varices noted - Follow as outpt and no current evidence of GIB. 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 HTN - BP fine on current meds.  She was encouraged to sell home and reside ALF forever.    Day of Discharge Exam BP 127/51  Pulse 83  Temp(Src) 98.1 F (36.7 C) (Oral)  Resp 20  Ht 5\' 6"  (1.676 m)  Wt 121.7 kg (268 lb 4.8 oz)  BMI 43.33 kg/m2  SpO2 95%  Physical Exam: General appearance: alert, cooperative and appears stated age  Resp: clear to auscultation bilaterally  Cardio: regular rate and rhythm, S1, S2 normal, no murmur, click, rub  or gallop  GI: soft, non-tender; bowel sounds normal; no masses, no organomegaly. + Fluid wave but not tight.  Extremities: legs wrapped with ace bandages and edema is less now.  Neurologic: Grossly normal  Morbid obesity  Discharge Labs:  Recent Labs  09/21/13 1010 09/24/13 0420  NA 138 138  K 3.8 4.9  CL 102 106  CO2 28 26  GLUCOSE 131* 100*  BUN 13 15  CREATININE 0.74 0.77  CALCIUM 8.7 8.3*    Recent Labs  09/21/13 1010  AST 58*  ALT 21  ALKPHOS 73  BILITOT 1.0  PROT 6.2  ALBUMIN 2.0*    Recent Labs  09/23/13 0428 09/24/13 0420  WBC 5.3 4.0  NEUTROABS 3.3 2.6  HGB 11.8* 10.9*  HCT 36.2 33.9*  MCV 87.9 88.1  PLT 128* 108*   No results found for this basename: CKTOTAL, CKMB, CKMBINDEX, TROPONINI,  in the last  72 hours No results found for this basename: TSH, T4TOTAL, FREET3, T3FREE, THYROIDAB,  in the last 72 hours  Recent Labs  09/22/13 1150  VITAMINB12 1241*   Lab Results  Component Value Date   INR 1.45 09/20/2013       Discharge instructions:  01-Home or Self Care Follow-up Information   Follow up with Gwen Pounds, MD In 5 days.   Specialty:  Internal Medicine   Contact information:   [address] [facility] [place] [address] [phone]       Follow up with HAYES,Karmine Kauer C, MD. Call in 2 weeks.   Specialty:  Gastroenterology   Contact information:   [address] [place] [address] [phone]        Disposition: [facility]  Follow-up Appts: Follow-up with Dr. Timothy Lasso at [facility] in 1 week.  Call for appointment.  Condition on Discharge: Better  Tests Needing Follow-up: CMET/CBC  Time spent in discharge (includes decision making & examination of pt): 45 min  Signed: Triston Lisanti M 09/24/2013, 8:18 AM

## 2013-09-25 ENCOUNTER — Encounter (HOSPITAL_COMMUNITY): Payer: Self-pay | Admitting: Internal Medicine

## 2013-09-25 ENCOUNTER — Inpatient Hospital Stay (HOSPITAL_COMMUNITY): Payer: Medicare Other

## 2013-09-25 NOTE — Progress Notes (Signed)
DC IV, DC Tele, DC to SNF. Discharge instructions and home medications prepared by social worker and discussed with patient and patient's daughter. Patient and daughter denied any questions or concerns at this time. Patient leaving unit via wheelchair and appears in no acute distress.

## 2013-09-25 NOTE — Discharge Summary (Addendum)
Physician Discharge Summary  DISCHARGE SUMMARY   Patient ID: [name] MR#: [id number] DOB/AGE: September 11, 1946 67 y.o.   Attending Physician:Lyndel Sarate M  Patient's KGM:WNUUV,OZDG M, MD  Consults:Treatment Team:  Graylin Shiver, MD [facility]**  Admit date: 09/19/2013 Discharge date: 09/25/2013  Discharge Diagnoses:  Principal Problem:   Ascites Active Problems:   Exogenous obesity   Cirrhosis   Shortness of breath   Patient Active Problem List   Diagnosis Date Noted  . Cirrhosis 09/19/2013  . Ascites 09/19/2013  . Shortness of breath 09/19/2013  . GI bleed 04/22/2013  . Anemia 04/21/2013  . Falls frequently 04/21/2013  . CVA (cerebral infarction) 04/21/2013  . Exogenous obesity   . Anxiety and depression   . Hiatal hernia   . Osteoarthritis   . Atypical chest pain   . Memory changes   . Bipolar disorder   . Benign hypertensive heart disease without heart failure 11/09/2011  . Chest pain 11/09/2011  . Anxiety 11/09/2011   Past Medical History  Diagnosis Date  . Exogenous obesity   . Hypertension   . Anxiety and depression     chronic  . Hiatal hernia   . Atypical chest pain 06/10/03    normal 2 day adenosine cardiolite  . Memory changes   . Bipolar disorder   . Ascites   . Cirrhosis   . Anginal pain   . Exertional shortness of breath   . Shortness of breath     "all the time right now" (09/19/2013)  . Anemia   . History of blood transfusion 05/2013    "once; related to a stomach ulcer" (09/19/2013)  . Bleeding stomach ulcer 04/22/2013  . GERD (gastroesophageal reflux disease)   . Osteoarthritis   . Arthritis     "all over my body" (09/19/2013)  . Anxiety   . Depression   . Fatty liver disease, nonalcoholic   . CVA (cerebral infarction) 04/21/13    CCT c/w Left cerebellar infarct which appears subacute to remote    Discharged Condition: Better  Discharge Medications:   Medication List         acetaminophen 325 MG tablet   Commonly known as:  TYLENOL  Take 1 tablet (325 mg total) by mouth every 6 (six) hours as needed for pain or fever.     albuterol (2.5 MG/3ML) 0.083% nebulizer solution  Commonly known as:  PROVENTIL  Take 2.5 mg by nebulization 4 (four) times daily as needed for wheezing.     aspirin EC 81 MG tablet  Take 81 mg by mouth daily.     CERTA-VITE PO  Take 1 tablet by mouth daily.     diltiazem 60 MG tablet  Commonly known as:  CARDIZEM  Take 0.5 tablets (30 mg total) by mouth 2 (two) times daily.     docusate sodium 100 MG capsule  Commonly known as:  COLACE  Take 100 mg by mouth daily as needed for constipation.     doxepin 75 MG capsule  Commonly known as:  SINEQUAN  Take 75 mg by mouth at bedtime.     feeding supplement (PRO-STAT SUGAR FREE 64) Liqd  Take 30 mLs by mouth once per day after breakfast and offer one post dinner.     FLUoxetine 40 MG capsule  Commonly known as:  PROZAC  Take 40 mg by mouth 2 (two) times daily. Per Dr.Pulis     furosemide 40 MG tablet  Commonly known as:  LASIX  Take 1.5 tablets (  60 mg total) by mouth daily.     lactose free nutrition Liqd  Take 237 mLs by mouth as needed (Please offer to pt if she misses or refuses a meal).     LORazepam 0.5 MG tablet  Commonly known as:  ATIVAN  Take 0.5 mg by mouth 2 (two) times daily as needed for anxiety.     Melatonin 3 MG Caps  Take 1 capsule by mouth at bedtime.     metoCLOPramide 5 MG tablet  Commonly known as:  REGLAN  Take 1 tablet (5 mg total) by mouth 2 (two) times daily as needed (Nausea).     nadolol 20 MG tablet  Commonly known as:  CORGARD  Take 20 mg by mouth daily.     nitroGLYCERIN 0.4 MG SL tablet  Commonly known as:  NITROSTAT  Place 0.4 mg under the tongue every 5 (five) minutes as needed for chest pain.     pantoprazole 40 MG tablet  Commonly known as:  PROTONIX  Take 40 mg by mouth 2 (two) times daily.     polyethylene glycol packet  Commonly known as:  MIRALAX /  GLYCOLAX  Take 17 g by mouth daily as needed (constipation).     spironolactone 25 MG tablet  Commonly known as:  ALDACTONE  Take 1 tablet (25 mg total) by mouth daily.        Hospital Procedures: Dg Chest 2 View  09/21/2013   CLINICAL DATA:  Followup atelectasis versus pneumonia  EXAM: CHEST  2 VIEW  COMPARISON:  09/19/2013  FINDINGS: Mild cardiac enlargement. Vascular pattern is normal. There is stable small left effusion and atelectasis at the left base. Right lung is clear.  IMPRESSION: No significant change from 09/19/2013 with persistent small left effusion and left lower lobe atelectasis.   Electronically Signed   By: Esperanza Heir M.D.   On: 09/21/2013 07:57   Dg Chest 2 View  09/19/2013   CLINICAL DATA:  Shortness of breath, hypertension  EXAM: CHEST  2 VIEW  COMPARISON:  08/08/2013  FINDINGS: Moderate cardiac enlargement. Mild aortic arch calcifications. Mild vascular congestion with no evidence of pulmonary edema or consolidation. Lateral view of limited diagnostic utility due to patient body habitus. Some degree of left lower lobe retrocardiac consolidation is suspected.  IMPRESSION: Limited study. Cardiac enlargement with vascular congestion but no pulmonary edema. Focal left lower lobe consolidation suspected although this area is poorly defined.   Electronically Signed   By: Esperanza Heir M.D.   On: 09/19/2013 14:18   Ct Angio Chest W/cm &/or Wo Cm  09/19/2013   CLINICAL DATA:  Shortness of breath.  EXAM: CT ANGIOGRAPHY CHEST WITH CONTRAST  TECHNIQUE: Multidetector CT imaging of the chest was performed using the standard protocol during bolus administration of intravenous contrast. Multiplanar CT image reconstructions including MIPs were obtained to evaluate the vascular anatomy.  CONTRAST:  OMNIPAQUE IOHEXOL 350 MG/ML SOLN  COMPARISON:  Chest radiograph 09/19/2013  FINDINGS: This is a technically good evaluation of the pulmonary arterial tree. No focal filling defects are  identified in the pulmonary arteries to suggest pulmonary embolism. Evaluation of the subsegmental pulmonary artery branches in the left lower lobe is somewhat limited by collapse/consolidation of that portion of the left lower lobe.  The heart is enlarged.  There is a small left pleural effusion.  There is a moderate-sized hiatal hernia. The wall of the herniated portion of the stomach appears thickened, but it could be accentuated by decompression.  The thoracic aorta is normal in caliber and enhancement.  No lymphadenopathy is detected in the thorax. Lung windows demonstrate collapse/ consolidation of the basilar portion of the left lower lobe. No pulmonary nodule or mass is detected. There is mild atelectasis in the right lower lobe.  The bones appear osteopenic. No acute bony abnormality is identified.  Upper abdomen: A moderate amount of abdominal ascites is seen in the upper abdomen. The liver contour appears nodular, consistent with hepatic cirrhosis. The spleen is not entirely visualized. The imaged portion appears within normal limits.  Review of the MIP images confirms the above findings.  IMPRESSION: 1. Collapse and/or consolidation of the basilar portion of the left lower lobe. Airspace disease related to infection or aspiration cannot be excluded in the left lower lobe.  2. Small left pleural effusion.  3. Moderate hiatal hernia. The herniated portion of the stomach appears to have a thickened wall, for which gastritis or malignancy cannot be excluded.  4. Hepatic cirrhosis and abdominal ascites  5. No evidence of pulmonary embolism  6. Cardiomegaly   Electronically Signed   By: Britta Mccreedy M.D.   On: 09/19/2013 18:00   US Abdomen Limited  09/19/2013   CLINICAL DATA:  Abdominal distention. Ascites.  EXAM: US ABDOMEN LIMITED  COMPARISON:  09/13/2013.  FINDINGS: Large volume of ascites is present. This is identified in all 4 quadrants.  IMPRESSION:  Large volume ascites.   Electronically Signed    By: Andreas Newport M.D.   On: 09/19/2013 15:01   US Paracentesis  09/20/2013   CLINICAL DATA:  Cirrhosis, recurrent ascites. Request is made for a therapeutic paracentesis up to 5.5 liters.  EXAM: ULTRASOUND GUIDED PARACENTESIS  PROCEDURE: An ultrasound guided paracentesis was thoroughly discussed with the patient and questions answered. The benefits, risks, alternatives and complications were also discussed. The patient understands and wishes to proceed with the procedure. Written consent was obtained.  Ultrasound was performed to localize and mark an adequate pocket of fluid in the left lower. quadrant of the abdomen. The area was then prepped and draped in the normal sterile fashion. 1% Lidocaine was used for local anesthesia. Under ultrasound guidance a 19 gauge Yueh catheter was introduced. Paracentesis was performed. The catheter was removed and a dressing applied.  COMPLICATIONS: None negative.  FINDINGS: A total of approximately 5.5 L of yellow fluid was removed. A fluid sample was not sent for laboratory analysis.  IMPRESSION: Successful ultrasound guided paracentesis yielding 5.5 L. of ascites.  Read by: Jeananne Rama, P.A.-C.   Electronically Signed   By: Richarda Overlie M.D.   On: 09/20/2013 11:23   US Paracentesis  09/13/2013   *RADIOLOGY REPORT*  Clinical Data: Ascites  ULTRASOUND GUIDED PARACENTESIS  An ultrasound guided paracentesis was thoroughly discussed with the patient and questions answered.  The benefits, risks, alternatives and complications were also discussed.  The patient understands and wishes to proceed with the procedure.  Written consent was obtained.  Ultrasound was performed to localize and mark an adequate pocket of fluid in the left lower quadrant of the abdomen.  The area was then prepped and draped in the normal sterile fashion.  1% Lidocaine was used for local anesthesia.  Under ultrasound guidance a 19 gauge Yueh catheter was introduced.  Paracentesis was performed.  The  catheter was removed and a dressing applied.  Complications:  none  Findings:  A total of approximately 3.5 liters of clear yellow fluid was removed.  A fluid sample was sent for  laboratory analysis.  IMPRESSION: Successful ultrasound guided paracentesis yielding 3.5 liters of ascites.  Read By: Pattricia Boss PA-C   Original Report Authenticated By: Tacey Ruiz, MD    History of Present Illness:  28 F pt c known Cirrhosis from NASH S/p prior 3.5 L Paracentesis 1 week prior to admission c (-) Cx and (-) Cytology and also maintained on chronic Diuretic therapy. Recent ECHO showed EF 55-65%, Mild LVH, Mild AS. She resides AL due to AFTT. The AL called the morning of admit with worsening LE Edema, Weaping, tense abdomen, Increasing SOB. We sent her to the ED. In ED CXR C/w Cardiac enlargement with vascular congestion but no pulmonary edema. Focal left lower lobe consolidation suspected although this area is poorly defined. EDP suspected HCAP and Multiple Abx given despite no Fever or WBC or cough or sputum. Ab US showed Large volume of ascites is present. This is identified in all 4 quadrants. I was called for inpt admission. D dimer was high so CTPA ordered and came back (-) for PE - I suspect no PNA but ATX due to pressure from the Volume. Will need to get GI involved to help me treat her.  09/06/13 ! Ammonia, Plasma [H] 95 ug/dL 40-98  Pt looked fine and was not hypoxic.  No clinical PNA.  All Volume issues. Admitted for eval and treatment.  Hospital Course: Admitted for Acute on chronic Liver Failure issues c Ascites and Volume Overload. Foley was placed and she was started on aggressive IV Diuresis. Foley is currently out! 5.5 liters Paracentesis done on 09/20/13. Dr Evette Cristal from [facility] who works with Dr Madilyn Fireman her primary GI Doc. Beta blocker continued. Aldactone started and dosing increased. No Asterixis, no Confusion, No Angiomata.  2Gm Na Diet c Increased protein recommended. We will  monitor weight and would like [facility] to weight her every other day and inform me whenever she gains > 3 #s in 2 days or her weight goes up over 275#.  Weight on admission was @ 286# and weight 10/14 was 268#.  The thought process is that she will probably need a few more outpatient paracentesis procedures prior to getting to her dry weight and be able to be maintained.  She currently does not need an indwelling Ab peritoneal catheter.  Dr Evette Cristal said it well on 09/22/13: Mobilization of ascites is a very slow process. She may need when necessary paracentesis. There is not much more than I can recommend at this time. We will sign off. she can followup with her primary gastroenterologist in our office after discharge. 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  In theory this should not medically stop her transfer to ALF today as long as BP remains stable post procedure and if procedure could be done early enough - but based on confusion issues yesterday I expect it will delay the D/C till tomorrow.  09/20/13 285 lb 11.5 oz (129.6 kg)  08/08/13 302 lb (136.986 kg)  04/22/13 280 lb (127.007 kg)  04/22/13 280 lb (127.007 kg)  05/17/12 270 lb (122.471 kg)  11/09/11 256 lb (116.121 kg)   Her diuretics had been changed to PO.  Her BP and Kidney function has tolerated all the fluid shifts.  Her SOB is better. She  was ready for D/C on 10/14.  All of her questions and Tamara's questions had been answered.  D/c order was done.  There was some confusion between ALF and the hospital and she did not get D/ced.  I discussed the issues c CSW and Danielle at [employer].  We set up outpatient Palliative Care.  I discussed c Family that Hospice palliative Care and DNR may be in Ms Etsitty's future but we are currently  premature for this.  She will get the Paracentesis today and be ready for D/C later today or tomorrow.  The decision @ ALF vrs SNF is based on Ms Maeder's function and she clearly is more functional than on admit - she is also not cofused or Demented and between herself and daughter Delaney Meigs they can both advocate on Ms Cantrell's behalf.  Also after D/c - I will see the pt back in my office very often so I can micro-manage her fluid and issues so as to promote the best QOL we can and try and keep from needing re-admissions.  i do not consider outpatient IR Paracentesis procedures a readmission though.  Below is the recommendations from Nutrition:  Encouraged healthful eating and incorporation of protein-rich foods at each meal  Provide Multivitamin with minerals daily  Provide Pro-stat BID - She did not like it so we will definitely do once per day with breakfaxt and try to get the second dosing in after dinner.  Pt may benefit from additional supplements: Vitamin B-complex supplement and water-soluble forms of Vitamin A, D, E, and K  Provide Boost Plus as needed (for missed meals)  She feels much better today than admission.  No Clinical PNA. Tongue is red/B12 is fine.  No Thrush seen.  She has lost lots of weight since admission and Ab is softer and she has less LE Edema.  We will continue Gentiva and continue LE wraps.  They were removed 10/14 so she could shower - she did and the wraps are back on. Sat in chair 4 hrs plus. Has been Walking the room and the halls c assistance and walker.  Originally the rec was to go to SNF and then back to Al.  I agree with Ms Schuelke and family to go to [facility] and get PT/OT there. Foley removed 10/13 without issue. She has mild positional Dizziness attributed to inner ear and rapid fluctuation of her Volume.  She should use her walker and maintain safety as this should improve quickly. I did discuss her case with Dr Bur[place] - her Eye doc-  and updated her  on her liver issues.  The liver issues may be contributing to her eye issues.  She has had a CCT 04/21/13: No evidence of acute intracranial abnormality. 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  The cerebellar findings on that CCT may also contribute to some dizzyness?  No new c/o and she is ready for D/C which hopefully can be today 10/15 vrs tomorrow 10/16.  Feels better than admit.  Main issue:  Ascites due to Liver failure/Cirrhosis from NASH - improved with meds and paracentecis. Continue Lasix and Aldactone monitoring Creatinine and weight and Sxs.  Will need Dr Madilyn Fireman Outpt follow up. They need to discuss Liver Transplant possibilities. I am currently in favor of med management only. They also need to talk @ the thick stomach on CTPA but she did have EGD in the Spring. Continue BB. Ammonia low so Lactulose not needed. SBP proph to be considered per  GI as outpt. Albumin Low at 2.0. AST elevated at 58-80. INR 1.4. Leukopenia and Thrombocytopenia C/W Cirrhosis. Cr is tolerating the fluid shifts. Will tap again 10/15 and may consider as outpatient.   Exogenous Morbid obesity but c Protein Cal malnutrition - Follow Nutrition recs. 2 gm low Na diet. -follow  Dizzy - Vertigo due to inner ear + lowish BP and sig Vol shift. Monitor. Vestibular PT discussed.  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 No Clinical PNA. Repeat CXR not significant.  No WBC issues, No Hypoxia, No Cough, No Fever. Red Tongue - B12 high = great. No Thrush seen.  Adult failure to thrive -She wants to return to ALF and not go to SNF. Will D/c c HH Services and Palliative Care in place.  DVT - Squeezers/Lovenox provided as inpt..  Depression - Mood stable on meds. Mild Anemia c Hbg 10.9 as she had GIB due to Antral Ulcers in 04/2013 - No Varices noted - Follow as outpt and no current evidence of GIB. CVA c Left cerebellar infarct which appears subacute to remote on prior CCT - Will continue RF  reduction. HTN - BP fine on current meds.  She was encouraged to sell home and reside ALF forever.    Day of Discharge Exam BP 112/5  Pulse 97  Temp(Src) 98.9 F (37.2 C) (Oral)  Resp 19  Ht 5\' 6"  (1.676 m)  Wt 124.3 kg (274 lb 0.5 oz)  BMI 44.25 kg/m2  SpO2 91%  Physical Exam: General appearance: alert, cooperative and appears stated age  Resp: clear to auscultation bilaterally  Cardio: regular rate and rhythm, S1, S2 normal, no murmur, click, rub or gallop  GI: soft, non-tender; bowel sounds normal; no masses, no organomegaly. + Fluid wave but not tight - alittle more vol today.  Extremities: legs wrapped with ace bandages and edema is less now. Most of edema is in thighs. Neurologic: Grossly normal  Morbid obesity  Discharge Labs:  Recent Labs  09/24/13 0420  NA 138  K 4.9  CL 106  CO2 26  GLUCOSE 100*  BUN 15  CREATININE 0.77  CALCIUM 8.3*   No results found for this basename: AST, ALT, ALKPHOS, BILITOT, PROT, ALBUMIN,  in the last 72 hours  Recent Labs  09/23/13 0428 09/24/13 0420  WBC 5.3 4.0  NEUTROABS 3.3 2.6  HGB 11.8* 10.9*  HCT 36.2 33.9*  MCV 87.9 88.1  PLT 128* 108*   No results found for this basename: CKTOTAL, CKMB, CKMBINDEX, TROPONINI,  in the last 72 hours No results found for this basename: TSH, T4TOTAL, FREET3, T3FREE, THYROIDAB,  in the last 72 hours  Recent Labs  09/22/13 1150  VITAMINB12 1241*   Lab Results  Component Value Date   INR 1.45 09/20/2013       Discharge instructions:  01-Home or Self Care Follow-up Information   Follow up with Gwen Pounds, MD In 5 days. (office will call the daughter today with appt )    Specialty:  Internal Medicine   Contact information:   [address] [facility] [place] [address] [phone]       Follow up with HAYES,Dayon Witt C, MD. Schedule an appointment as soon as possible for a visit on 10/07/2013. (@12 :45 spoke with Sharyl Nimrod )    Specialty:   Gastroenterology   Contact information:   [address], [address] [place] [address] [phone]        Disposition: [facility]  Follow-up Appts:  Follow-up with Dr. Timothy Lasso at [facility] in 1 week.  Call for appointment.  Condition on Discharge: Better  Tests Needing Follow-up: CMET/CBC  Time spent in discharge (includes decision making & examination of pt): 45 min  Signed: Avett Reineck M 09/25/2013, 8:22 AM   She was seen by PT today and did well Successful US guided paracentesis from LL[facility].  Yielded 5L of clear yellow fluid.  No immediate complications.  Pt tolerated well.  I talked c her at 2:30 after procedure and she did well. She is ready to go to AL. I called SW and everything is ready.

## 2013-09-25 NOTE — Progress Notes (Signed)
Physical Therapy Treatment Patient Details Name: [name] MRN: [id number] DOB: 1946/11/07 Today's Date: 09/25/2013 Time: 4098-1191 PT Time Calculation (min): 40 min  PT Assessment / Plan / Recommendation  History of Present Illness Pt admit for ascites, cirrhosis, and volume overload.   PT Comments   Pt admitted with above. Pt currently with functional limitations due to continued weakness and vertigo.  Pt will benefit from skilled PT to increase their independence and safety with mobility to allow discharge to the venue listed below.   Follow Up Recommendations  Supervision/Assistance - 24 hour;Home health PT;SNF (States she is going to A living with HHPT and not SNF.)                 Equipment Recommendations  None recommended by PT        Frequency Min 3X/week   Progress towards PT Goals Progress towards PT goals: Progressing toward goals  Plan Current plan remains appropriate    Precautions / Restrictions Precautions Precautions: Fall Restrictions Weight Bearing Restrictions: No   Pertinent Vitals/Pain VSS, no pain    Mobility  Bed Mobility Bed Mobility: Rolling Right;Rolling Left;Right Sidelying to Sit;Sitting - Scoot to Edge of Bed Rolling Right: 5: Supervision;With rail Rolling Left: 5: Supervision;With rail Right Sidelying to Sit: 3: Mod assist;With rails;HOB elevated Sitting - Scoot to Edge of Bed: 4: Min assist Details for Bed Mobility Assistance: Determined to treat right side for BPPV since pt now dizzier rolling right.  Performed right canalith repositioning maneuver.  Pt stated she felt better after treatment.  Took incr time to assist pt into correct position in bed with pt propped on pillows to allow the crystals to settle.  Pt informed to not lay flat for next 18 hours since BPPV continues to be an issue.  Pt agrees.  Pt aware that when she goes to A living she needs to continue [employer] exercises.   Transfers Transfers: Sit to Stand;Stand  to Sit Sit to Stand: With upper extremity assist;4: Min assist;From bed Stand to Sit: 4: Min assist;With upper extremity assist;To bed Stand Pivot Transfers: Not tested (comment) Details for Transfer Assistance: Verbal cues for hand placement with pt placing one hand on RW and one on chair for sit to stand.     Ambulation/Gait Ambulation/Gait Assistance: Not tested (comment) Stairs: No Wheelchair Mobility Wheelchair Mobility: No    Exercises Other Exercises Other Exercises: Austin Miles exercise    PT Goals (current goals can now be found in the care plan section)    Visit Information  Last PT Received On: 09/25/13 Assistance Needed: +1 History of Present Illness: Pt admit for ascites, cirrhosis, and volume overload.    Subjective Data  Subjective: "I was feeling so much better since the treatment yesterday until I bathed this am."   Cognition  Cognition Arousal/Alertness: Awake/alert Behavior During Therapy: WFL for tasks assessed/performed Overall Cognitive Status: Within Functional Limits for tasks assessed    Balance  Static Sitting Balance Static Sitting - Balance Support: No upper extremity supported;Feet supported Static Sitting - Level of Assistance: 5: Stand by assistance Static Sitting - Comment/# of Minutes: 5 Static Standing Balance Static Standing - Balance Support: Bilateral upper extremity supported;During functional activity Static Standing - Level of Assistance: 4: Min assist Static Standing - Comment/# of Minutes: 3  End of Session PT - End of Session Equipment Utilized During Treatment: Gait belt Activity Tolerance: Patient limited by fatigue Patient left: with call bell/phone within reach;in bed Nurse Communication: Mobility  status        INGOLD,Amoy Steeves 09/25/2013, 11:10 AM [facility] [phone] (pager)

## 2013-09-25 NOTE — Progress Notes (Signed)
Co-signed for LaTisha Teasley RN/BSN for assessments, IV assessments, medication administration, I's & O's, progress notes, care plans, and vital signs. Junior Kenedy M, RN//BSN 

## 2013-09-25 NOTE — Procedures (Signed)
Successful US guided paracentesis from [facility].  Yielded 5L of clear yellow fluid.  No immediate complications.  Pt tolerated well.   Specimen was not sent for labs.  Brayton El PA-C 09/25/2013 2:16 PM

## 2013-09-26 NOTE — Progress Notes (Signed)
Patient had a paracentesis this a.m and per Dr. Timothy Lasso- is medically stable for d/c back to [facility]F. He has assessed patient and spoke to her daughter and now feels that SNF level is not indicated for patient and she can safely return to ALF.  Daughter Barrie Dunker agrees and will transport patient via car. CSW spoke with Duwayne Heck- [facility] and [facility] updated and sent to facility for review with d/c summary. OKfor d/c back to facility today per Danielle. Patient and daughter are pleased with this arrangement. Nursing notified and called report. CSW signing off.  Lorri Frederick. Ringo Sherod, LCSWA [phone]

## 2013-10-03 ENCOUNTER — Other Ambulatory Visit (HOSPITAL_COMMUNITY): Payer: Self-pay | Admitting: Internal Medicine

## 2013-10-03 DIAGNOSIS — R188 Other ascites: Secondary | ICD-10-CM

## 2013-10-07 ENCOUNTER — Ambulatory Visit (HOSPITAL_COMMUNITY)
Admission: RE | Admit: 2013-10-07 | Discharge: 2013-10-07 | Disposition: A | Discharge status: Home / Self Care | Payer: Medicare Other | Source: Ambulatory Visit | Attending: Internal Medicine | Admitting: Internal Medicine

## 2013-10-07 DIAGNOSIS — K7460 Unspecified cirrhosis of liver: Secondary | ICD-10-CM | POA: Insufficient documentation

## 2013-10-07 DIAGNOSIS — R188 Other ascites: Secondary | ICD-10-CM

## 2013-10-07 NOTE — Procedures (Signed)
Successful US guided paracentesis from [facility].  Yielded 5.5 liters of yellow fluid.  No immediate complications.  Pt tolerated well.   Specimen was not sent for labs.  Pattricia Boss D PA-C 10/07/2013 12:22 PM

## 2013-10-10 ENCOUNTER — Other Ambulatory Visit (HOSPITAL_COMMUNITY): Payer: Self-pay | Admitting: Internal Medicine

## 2013-10-10 DIAGNOSIS — K7460 Unspecified cirrhosis of liver: Secondary | ICD-10-CM

## 2013-10-10 DIAGNOSIS — R188 Other ascites: Secondary | ICD-10-CM

## 2013-10-11 ENCOUNTER — Emergency Department (HOSPITAL_COMMUNITY): Payer: Medicare Other

## 2013-10-11 ENCOUNTER — Emergency Department (HOSPITAL_COMMUNITY)
Admission: EM | Admit: 2013-10-11 | Discharge: 2013-10-11 | Disposition: A | Discharge status: Home / Self Care | Payer: Medicare Other | Attending: Emergency Medicine | Admitting: Emergency Medicine

## 2013-10-11 DIAGNOSIS — R188 Other ascites: Secondary | ICD-10-CM | POA: Insufficient documentation

## 2013-10-11 DIAGNOSIS — M1990 Unspecified osteoarthritis, unspecified site: Secondary | ICD-10-CM | POA: Insufficient documentation

## 2013-10-11 DIAGNOSIS — E669 Obesity, unspecified: Secondary | ICD-10-CM | POA: Insufficient documentation

## 2013-10-11 DIAGNOSIS — R600 Localized edema: Secondary | ICD-10-CM

## 2013-10-11 DIAGNOSIS — Z7982 Long term (current) use of aspirin: Secondary | ICD-10-CM | POA: Insufficient documentation

## 2013-10-11 DIAGNOSIS — Z79899 Other long term (current) drug therapy: Secondary | ICD-10-CM | POA: Insufficient documentation

## 2013-10-11 DIAGNOSIS — R0602 Shortness of breath: Secondary | ICD-10-CM | POA: Insufficient documentation

## 2013-10-11 DIAGNOSIS — F319 Bipolar disorder, unspecified: Secondary | ICD-10-CM | POA: Insufficient documentation

## 2013-10-11 DIAGNOSIS — I10 Essential (primary) hypertension: Secondary | ICD-10-CM | POA: Insufficient documentation

## 2013-10-11 DIAGNOSIS — F411 Generalized anxiety disorder: Secondary | ICD-10-CM | POA: Insufficient documentation

## 2013-10-11 DIAGNOSIS — K219 Gastro-esophageal reflux disease without esophagitis: Secondary | ICD-10-CM | POA: Insufficient documentation

## 2013-10-11 DIAGNOSIS — Z862 Personal history of diseases of the blood and blood-forming organs and certain disorders involving the immune mechanism: Secondary | ICD-10-CM | POA: Insufficient documentation

## 2013-10-11 DIAGNOSIS — R609 Edema, unspecified: Secondary | ICD-10-CM | POA: Insufficient documentation

## 2013-10-11 DIAGNOSIS — K7460 Unspecified cirrhosis of liver: Secondary | ICD-10-CM | POA: Insufficient documentation

## 2013-10-11 LAB — CBC WITH DIFFERENTIAL/PLATELET
Basophils Absolute: 0 10*3/uL (ref 0.0–0.1)
Basophils Relative: 1 % (ref 0–1)
Eosinophils Absolute: 0.2 10*3/uL (ref 0.0–0.7)
Eosinophils Relative: 4 % (ref 0–5)
HCT: 35 % — ABNORMAL LOW (ref 36.0–46.0)
Lymphocytes Relative: 16 % (ref 12–46)
Lymphs Abs: 0.8 10*3/uL (ref 0.7–4.0)
MCH: 29 pg (ref 26.0–34.0)
MCV: 90.7 fL (ref 78.0–100.0)
Monocytes Absolute: 0.5 10*3/uL (ref 0.1–1.0)
RDW: 17.6 % — ABNORMAL HIGH (ref 11.5–15.5)
WBC: 4.8 10*3/uL (ref 4.0–10.5)

## 2013-10-11 LAB — COMPREHENSIVE METABOLIC PANEL
Alkaline Phosphatase: 86 U/L (ref 39–117)
BUN: 18 mg/dL (ref 6–23)
CO2: 28 mEq/L (ref 19–32)
Calcium: 8.7 mg/dL (ref 8.4–10.5)
Creatinine, Ser: 0.92 mg/dL (ref 0.50–1.10)
GFR calc Af Amer: 73 mL/min — ABNORMAL LOW (ref >90)
GFR calc non Af Amer: 63 mL/min — ABNORMAL LOW (ref >90)
Glucose, Bld: 88 mg/dL (ref 70–99)
Sodium: 136 mEq/L (ref 135–145)

## 2013-10-11 LAB — POCT I-STAT TROPONIN I: Troponin i, poc: 0 ng/mL (ref 0.00–0.08)

## 2013-10-11 LAB — PRO B NATRIURETIC PEPTIDE: Pro B Natriuretic peptide (BNP): 76.5 pg/mL (ref 0–125)

## 2013-10-11 NOTE — ED Provider Notes (Signed)
CSN: [id number]     Arrival date & time 10/11/13  1401 History   First MD Initiated Contact with Patient 10/11/13 1500     Chief Complaint  Patient presents with  . Shortness of Breath   (Consider location/radiation/quality/duration/timing/severity/associated sxs/prior Treatment) The history is provided by the patient. No language interpreter was used.    Patient is a 67 year old Caucasian female past medical history of nonalcoholic cirrhosis who comes emergency department today with shortness of breath. She receives paracentesis weekly by primary care physician for ascites. In between her paracenteses she developed shortness of breath. She feels that she is at her baseline respiratory status. She was at a nursing home. Today she was being cared for by a nurse who had not cared for her before. The nurses concerned with the way she was breathing considered the emergency department. Ms. Yount tried to tell her that she was breathing normally for herself and did not want to come however the nurse sent her anyway. She denies fevers, chills, abdominal pain, dysuria, frequency, or urgency. She does have some lower extremity edema and some abdominal distention which she states are normal for her.  Past Medical History  Diagnosis Date  . Exogenous obesity   . Hypertension   . Anxiety and depression     chronic  . Hiatal hernia   . Atypical chest pain 06/10/03    normal 2 day adenosine cardiolite  . Memory changes   . Bipolar disorder   . Ascites   . Cirrhosis   . Anginal pain   . Exertional shortness of breath   . Shortness of breath     "all the time right now" (09/19/2013)  . Anemia   . History of blood transfusion 05/2013    "once; related to a stomach ulcer" (09/19/2013)  . Bleeding stomach ulcer 04/22/2013  . GERD (gastroesophageal reflux disease)   . Osteoarthritis   . Arthritis     "all over my body" (09/19/2013)  . Anxiety   . Depression   . Fatty liver disease, nonalcoholic    . CVA (cerebral infarction) 04/21/13    CCT c/w Left cerebellar infarct which appears subacute to remote   Past Surgical History  Procedure Laterality Date  . Tubal ligation  1977  . Cardiac catheterization  1990    normal cath  . Esophagogastroduodenoscopy N/A 04/22/2013    Procedure: ESOPHAGOGASTRODUODENOSCOPY (EGD);  Surgeon: Shirley Friar, MD;  Location: [facility] ENDOSCOPY;  Service: Endoscopy;  Laterality: N/A;  . Paracentesis  09/13/2013    "just once" (09/19/2013)  . Carpal tunnel release Bilateral 1990's  . Knee arthroscopy w/ acl reconstruction Bilateral ?1990's  . Vaginal hysterectomy  1980  . Salpingoophorectomy Bilateral 1990's   Family History  Problem Relation Age of Onset  . Heart attack Mother   . Heart disease Mother   . Heart disease Father   . Heart attack Father    History  Substance Use Topics  . Smoking status: Never Smoker   . Smokeless tobacco: Never Used  . Alcohol Use: No   OB History   Grav Para Term Preterm Abortions TAB SAB Ect Mult Living                 Review of Systems  Constitutional: Negative for fever and chills.  Respiratory: Positive for shortness of breath (baseline). Negative for cough.   Cardiovascular: Positive for leg swelling (baseline). Negative for chest pain.  Gastrointestinal: Positive for abdominal distention (baseline). Negative for nausea, vomiting, abdominal  pain, diarrhea and constipation.  Genitourinary: Negative for dysuria, urgency and frequency.  All other systems reviewed and are negative.    Allergies  Codeine and Sulfa antibiotics  Home Medications   Current Outpatient Rx  Name  Route  Sig  Dispense  Refill  . acetaminophen (TYLENOL) 325 MG tablet   Oral   Take 1 tablet (325 mg total) by mouth every 6 (six) hours as needed for pain or fever.         Marland Kitchen albuterol (PROVENTIL) (2.5 MG/3ML) 0.083% nebulizer solution   Nebulization   Take 2.5 mg by nebulization 4 (four) times daily as needed for  wheezing.         Marland Kitchen aspirin EC 81 MG tablet   Oral   Take 81 mg by mouth daily.         Marland Kitchen docusate sodium (COLACE) 100 MG capsule   Oral   Take 100 mg by mouth daily as needed for constipation.         Marland Kitchen doxepin (SINEQUAN) 75 MG capsule   Oral   Take 75 mg by mouth at bedtime.          . feeding supplement, PRO-STAT SUGAR FREE 64, (PRO-STAT SUGAR FREE 64) LIQD      Take 30 mLs by mouth once per day after breakfast and offer one post dinner.   900 mL   4   . FLUoxetine (PROZAC) 40 MG capsule   Oral   Take 40 mg by mouth 2 (two) times daily. Per Dr.Pulis         . furosemide (LASIX) 20 MG tablet   Oral   Take 20 mg by mouth 3 (three) times daily.         Marland Kitchen lactose free nutrition (BOOST PLUS) LIQD   Oral   Take 237 mLs by mouth as needed (Please offer to pt if she misses or refuses a meal).      0   . LORazepam (ATIVAN) 0.5 MG tablet   Oral   Take 0.5 mg by mouth 2 (two) times daily as needed for anxiety.         . Melatonin 3 MG CAPS   Oral   Take 1 capsule by mouth at bedtime.          . metoCLOPramide (REGLAN) 5 MG tablet   Oral   Take 1 tablet (5 mg total) by mouth 2 (two) times daily as needed (Nausea).         . Multiple Vitamins-Minerals (CERTA-VITE PO)   Oral   Take 1 tablet by mouth daily.         . nadolol (CORGARD) 20 MG tablet   Oral   Take 20 mg by mouth daily.         . nitroGLYCERIN (NITROSTAT) 0.4 MG SL tablet   Sublingual   Place 0.4 mg under the tongue every 5 (five) minutes as needed for chest pain.         Marland Kitchen ondansetron (ZOFRAN) 4 MG tablet   Oral   Take 4 mg by mouth every 6 (six) hours as needed for nausea.         . pantoprazole (PROTONIX) 40 MG tablet   Oral   Take 40 mg by mouth 2 (two) times daily.         . polyethylene glycol (MIRALAX / GLYCOLAX) packet   Oral   Take 17 g by mouth daily as needed (constipation).         Marland Kitchen  spironolactone (ALDACTONE) 50 MG tablet   Oral   Take 50 mg by mouth  daily.          BP 108/56  Pulse 80  Temp(Src) 97.6 F (36.4 C) (Oral)  Resp 15  SpO2 97% Physical Exam  Nursing note and vitals reviewed. Constitutional: She is oriented to person, place, and time. She appears well-developed and well-nourished. No distress.  HENT:  Head: Normocephalic and atraumatic.  Eyes: Pupils are equal, round, and reactive to light.  Neck: Normal range of motion.  Cardiovascular: Normal rate, regular rhythm, normal heart sounds and intact distal pulses.   Pulmonary/Chest: Effort normal. No accessory muscle usage. Not tachypneic and not bradypneic. No respiratory distress. She has no decreased breath sounds. She has no wheezes. She has no rales. She exhibits no tenderness.  Abdominal: Soft. Bowel sounds are normal. She exhibits shifting dullness, distension, fluid wave and ascites. There is no tenderness. There is no rebound and no guarding.  Musculoskeletal:       Right lower leg: She exhibits edema (2+).       Left lower leg: She exhibits edema (2+).  Neurological: She is alert and oriented to person, place, and time. She has normal strength. No cranial nerve deficit or sensory deficit. She exhibits normal muscle tone. Coordination and gait normal.  Skin: Skin is warm and dry.    ED Course  Procedures (including critical care time) Labs Review Labs Reviewed  CBC WITH DIFFERENTIAL - Abnormal; Notable for the following:    RBC 3.86 (*)    Hemoglobin 11.2 (*)    HCT 35.0 (*)    RDW 17.6 (*)    Platelets 123 (*)    All other components within normal limits  COMPREHENSIVE METABOLIC PANEL - Abnormal; Notable for the following:    Albumin 2.1 (*)    AST 74 (*)    GFR calc non Af Amer 63 (*)    GFR calc Af Amer 73 (*)    All other components within normal limits  PRO B NATRIURETIC PEPTIDE  POCT I-STAT TROPONIN I   Imaging Review Dg Chest 2 View  10/11/2013   CLINICAL DATA:  Intermittent shortness of breath  EXAM: CHEST  2 VIEW  COMPARISON:  Prior  chest x-ray 09/21/2013  FINDINGS: Stable cardiomegaly with left ventricular prominence. Atherosclerotic calcifications noted in the transverse aorta. A limited lateral view secondary to patient body habitus. There is consolidation in the posterior left lower lobe. There may be a small left pleural effusion. No acute osseous abnormality.  IMPRESSION: 1. Probable small left pleural effusion with associated left lower lobe atelectasis versus consolidation. 2. Stable mild cardiomegaly with left heart prominence. 3. Aortic atherosclerosis.   Electronically Signed   By: Malachy Moan M.D.   On: 10/11/2013 16:20    EKG Interpretation     Ventricular Rate:  78 PR Interval:  166 QRS Duration: 97 QT Interval:  433 QTC Calculation: 493 R Axis:   11 Text Interpretation:  Sinus rhythm Low voltage, precordial leads Borderline T abnormalities, anterior leads Borderline prolonged QT interval No significant change was found            MDM  Patient is a 67 year old Caucasian female past medical history nonalcoholic cirrhosis and ascites comes emergency department today with shortness of breath. Physical exam as above. He did not want to come to the emergency department she was sent by a nurse and does not know her well. She and her daughter both feel that  she is at her baseline respiratory status. Her daughter communicated with the patient's primary care physician after she was at the emergency department who agreed that if the daughter and the patient does not feel she needs to be treated in the hospital that he would be unlikely to admit her. Initial workup included an i-STAT troponin, BNP, CBC, CMP, chest x-ray, and EKG. EKG as above. Chest x-ray demonstrated a small left pleural effusion with atelectasis versus consolidation. With no coughing, baseline respiratory status, no fevers, and benign pulmonary exam doubt a pneumonia. CMP and AST of 74 constipation and. Creatinine 0.92. Otherwise unremarkable.  CBC hemoglobin 11.2 the patient's baseline. Otherwise unremarkable. BMP was 76.5. Troponin was negative. Patient was felt to be stable for discharge. Chart he has an appointment with her primary care physician for Tuesday to have a paracentesis which she states improves her shortness of breath. He was instructed to maintain this appointment. She was instructed to return to emergency department she develops worsening shortness of breath, fevers, or any other concerns. She expressed understanding.  Patient was discharged in stable condition. Labs and imaging reviewed by myself and considered and medical decision-making. Imaging was interpreted by radiology. Care was discussed with my attending Dr. Romeo Apple.    1. Cirrhosis   2. Ascites   3. Shortness of breath   4. Lower extremity edema       Bethann Berkshire, MD 10/11/13 [phone]

## 2013-10-11 NOTE — ED Notes (Signed)
Patient transported to X-ray 

## 2013-10-11 NOTE — ED Notes (Signed)
Spoke with DJ [phone]) at [facility]. Dr Cyndie Chime is aware that pt is here and does not intend to admit her. Pt has scheduled paracentesis on Tuesday and he plans to keep that scheduled.

## 2013-10-11 NOTE — ED Notes (Addendum)
Pt in from assisted living via EMS- per EMS, pt in c/o increased shortness of breath today and weight gain over the last few days, pt has a history of liver failure and CHF, was seen in the hospital and required a paracentesis, pt states she receives these regularly but was told to come in due to irregular weight gain, pt has gained 6lbs in the last two days, IV started PTA, pt given two breathing treatments prior to arrival and states her breathing feels improved, pt speaking in full sentences, no distress noted

## 2013-10-12 NOTE — ED Provider Notes (Signed)
Medical screening examination/treatment/procedure(s) were conducted as a shared visit with resident physician and myself.  I personally evaluated the patient during the encounter. I personally reviewed and interpreted the ecg and agree with the residents interpretation.   I interviewed and examined the patient. Lungs are CTAB. Cardiac exam wnl. Abdomen soft w/out distension. Discussed w/ pt and family who would prefer to wait for paracentesis which is already scheduled next week. Pt appears well w/out inc wob.  Will d/c, return precautions given.   Junius Argyle, MD 10/12/13 1128

## 2013-10-15 ENCOUNTER — Ambulatory Visit (HOSPITAL_COMMUNITY)
Admission: RE | Admit: 2013-10-15 | Discharge: 2013-10-15 | Disposition: A | Discharge status: Home / Self Care | Payer: Medicare Other | Source: Ambulatory Visit | Attending: Internal Medicine | Admitting: Internal Medicine

## 2013-10-15 DIAGNOSIS — K7460 Unspecified cirrhosis of liver: Secondary | ICD-10-CM

## 2013-10-15 DIAGNOSIS — R188 Other ascites: Secondary | ICD-10-CM | POA: Insufficient documentation

## 2013-10-15 NOTE — Procedures (Signed)
Successful US guided paracentesis from [facility].  Yielded 5.5L of clear yellow fluid.  No immediate complications.  Pt tolerated well.   Specimen was not sent for labs.  Brayton El PA-C 10/15/2013 12:02 PM\

## 2013-10-28 ENCOUNTER — Other Ambulatory Visit: Payer: Self-pay | Admitting: Gastroenterology

## 2013-10-28 DIAGNOSIS — R188 Other ascites: Secondary | ICD-10-CM

## 2013-10-31 ENCOUNTER — Ambulatory Visit
Admission: RE | Admit: 2013-10-31 | Discharge: 2013-10-31 | Disposition: A | Discharge status: Home / Self Care | Payer: No Typology Code available for payment source | Source: Ambulatory Visit | Attending: Gastroenterology | Admitting: Gastroenterology

## 2013-10-31 DIAGNOSIS — R188 Other ascites: Secondary | ICD-10-CM

## 2013-10-31 MED ORDER — IOHEXOL 350 MG/ML SOLN
125.0000 mL | Freq: Once | INTRAVENOUS | Status: AC | PRN
  Administered 2013-10-31: 125 mL via INTRAVENOUS

## 2013-12-02 ENCOUNTER — Emergency Department (HOSPITAL_COMMUNITY)
Admission: EM | Admit: 2013-12-02 | Discharge: 2013-12-02 | Disposition: A | Discharge status: Skilled Nursing Facility | Payer: Medicare Other | Attending: Emergency Medicine | Admitting: Emergency Medicine

## 2013-12-02 ENCOUNTER — Encounter (HOSPITAL_COMMUNITY): Payer: Self-pay | Admitting: Emergency Medicine

## 2013-12-02 DIAGNOSIS — Z882 Allergy status to sulfonamides status: Secondary | ICD-10-CM | POA: Insufficient documentation

## 2013-12-02 DIAGNOSIS — Z885 Allergy status to narcotic agent status: Secondary | ICD-10-CM | POA: Insufficient documentation

## 2013-12-02 DIAGNOSIS — Z8673 Personal history of transient ischemic attack (TIA), and cerebral infarction without residual deficits: Secondary | ICD-10-CM | POA: Insufficient documentation

## 2013-12-02 DIAGNOSIS — D649 Anemia, unspecified: Secondary | ICD-10-CM | POA: Insufficient documentation

## 2013-12-02 DIAGNOSIS — R42 Dizziness and giddiness: Secondary | ICD-10-CM | POA: Insufficient documentation

## 2013-12-02 DIAGNOSIS — T424X5A Adverse effect of benzodiazepines, initial encounter: Secondary | ICD-10-CM | POA: Insufficient documentation

## 2013-12-02 DIAGNOSIS — I10 Essential (primary) hypertension: Secondary | ICD-10-CM | POA: Insufficient documentation

## 2013-12-02 DIAGNOSIS — T50905A Adverse effect of unspecified drugs, medicaments and biological substances, initial encounter: Secondary | ICD-10-CM

## 2013-12-02 DIAGNOSIS — Z8709 Personal history of other diseases of the respiratory system: Secondary | ICD-10-CM | POA: Insufficient documentation

## 2013-12-02 DIAGNOSIS — Z888 Allergy status to other drugs, medicaments and biological substances status: Secondary | ICD-10-CM | POA: Insufficient documentation

## 2013-12-02 DIAGNOSIS — R0602 Shortness of breath: Secondary | ICD-10-CM | POA: Insufficient documentation

## 2013-12-02 DIAGNOSIS — Z79899 Other long term (current) drug therapy: Secondary | ICD-10-CM | POA: Insufficient documentation

## 2013-12-02 DIAGNOSIS — R471 Dysarthria and anarthria: Secondary | ICD-10-CM | POA: Insufficient documentation

## 2013-12-02 DIAGNOSIS — F341 Dysthymic disorder: Secondary | ICD-10-CM | POA: Insufficient documentation

## 2013-12-02 DIAGNOSIS — K219 Gastro-esophageal reflux disease without esophagitis: Secondary | ICD-10-CM | POA: Insufficient documentation

## 2013-12-02 DIAGNOSIS — Z8719 Personal history of other diseases of the digestive system: Secondary | ICD-10-CM | POA: Insufficient documentation

## 2013-12-02 DIAGNOSIS — R413 Other amnesia: Secondary | ICD-10-CM | POA: Insufficient documentation

## 2013-12-02 DIAGNOSIS — Z9189 Other specified personal risk factors, not elsewhere classified: Secondary | ICD-10-CM | POA: Insufficient documentation

## 2013-12-02 DIAGNOSIS — F319 Bipolar disorder, unspecified: Secondary | ICD-10-CM | POA: Insufficient documentation

## 2013-12-02 DIAGNOSIS — Z7982 Long term (current) use of aspirin: Secondary | ICD-10-CM | POA: Insufficient documentation

## 2013-12-02 DIAGNOSIS — R1310 Dysphagia, unspecified: Secondary | ICD-10-CM | POA: Insufficient documentation

## 2013-12-02 DIAGNOSIS — R404 Transient alteration of awareness: Secondary | ICD-10-CM | POA: Insufficient documentation

## 2013-12-02 DIAGNOSIS — Z8679 Personal history of other diseases of the circulatory system: Secondary | ICD-10-CM | POA: Insufficient documentation

## 2013-12-02 DIAGNOSIS — M1990 Unspecified osteoarthritis, unspecified site: Secondary | ICD-10-CM | POA: Insufficient documentation

## 2013-12-02 DIAGNOSIS — R61 Generalized hyperhidrosis: Secondary | ICD-10-CM | POA: Insufficient documentation

## 2013-12-02 LAB — COMPREHENSIVE METABOLIC PANEL
AST: 71 U/L — ABNORMAL HIGH (ref 0–37)
Albumin: 2.6 g/dL — ABNORMAL LOW (ref 3.5–5.2)
Alkaline Phosphatase: 69 U/L (ref 39–117)
Chloride: 101 mEq/L (ref 96–112)
Creatinine, Ser: 0.84 mg/dL (ref 0.50–1.10)
GFR calc non Af Amer: 70 mL/min — ABNORMAL LOW (ref >90)
Potassium: 4.6 mEq/L (ref 3.5–5.1)
Total Bilirubin: 1.5 mg/dL — ABNORMAL HIGH (ref 0.3–1.2)
Total Protein: 7.1 g/dL (ref 6.0–8.3)

## 2013-12-02 LAB — CBC WITH DIFFERENTIAL/PLATELET
Basophils Absolute: 0 10*3/uL (ref 0.0–0.1)
Basophils Relative: 0 % (ref 0–1)
Eosinophils Absolute: 0.1 10*3/uL (ref 0.0–0.7)
MCHC: 32.7 g/dL (ref 30.0–36.0)
Neutro Abs: 2.4 10*3/uL (ref 1.7–7.7)
Neutrophils Relative %: 69 % (ref 43–77)
RDW: 17.4 % — ABNORMAL HIGH (ref 11.5–15.5)

## 2013-12-02 LAB — POCT I-STAT TROPONIN I

## 2013-12-02 MED ORDER — SODIUM CHLORIDE 0.9 % IV BOLUS (SEPSIS)
1000.0000 mL | Freq: Once | INTRAVENOUS | Status: AC
  Administered 2013-12-02: 1000 mL via INTRAVENOUS

## 2013-12-02 NOTE — ED Provider Notes (Signed)
CSN: [id number]     Arrival date & time 12/02/13  0131 History   First MD Initiated Contact with Patient 12/02/13 0150     Chief Complaint  Patient presents with  . Leg Pain  . Shortness of Breath   (Consider location/radiation/quality/duration/timing/severity/associated sxs/prior Treatment) HPI History provided by pt and her daughter.  Per patient's daughter, pt has a tendency to fall and has been moved to an ALF.  There is a technician that administers her medications and she takes 0.5mg  po ativan prn anxiety.  She has been on this medication for a long time and it has always caused her to become loopy and dizzy.  Pt reports that she took the medication at 9pm tonight and shortly after, when she attempted to get up to use the restroom, she was unable to move her legs.  She felt like her legs were rubber.  When nursing staff assisted her to the bathroom, she became dizzy and nursing staff told her daughter she was clammy.  These sx are stable now.  Her daughter states that she had inability to move her legs after taking this medication last Monday as well.  This is not a new problem.  She is not acting her normal self, but behavior is typical for having received ativan last night.  Pt denies CP, SOB, vision changes, numbness of extremities.  She has dysarthria/dysphagia but attributes to severely dry mouth.  No recent illness.  Drank plenty of fluids yesterday.   Past Medical History  Diagnosis Date  . Exogenous obesity   . Hypertension   . Anxiety and depression     chronic  . Hiatal hernia   . Atypical chest pain 06/10/03    normal 2 day adenosine cardiolite  . Memory changes   . Bipolar disorder   . Ascites   . Cirrhosis   . Anginal pain   . Exertional shortness of breath   . Shortness of breath     "all the time right now" (09/19/2013)  . Anemia   . History of blood transfusion 05/2013    "once; related to a stomach ulcer" (09/19/2013)  . Bleeding stomach ulcer 04/22/2013  . GERD  (gastroesophageal reflux disease)   . Osteoarthritis   . Arthritis     "all over my body" (09/19/2013)  . Anxiety   . Depression   . Fatty liver disease, nonalcoholic   . CVA (cerebral infarction) 04/21/13    CCT c/w Left cerebellar infarct which appears subacute to remote   Past Surgical History  Procedure Laterality Date  . Tubal ligation  1977  . Cardiac catheterization  1990    normal cath  . Esophagogastroduodenoscopy N/A 04/22/2013    Procedure: ESOPHAGOGASTRODUODENOSCOPY (EGD);  Surgeon: Shirley Friar, MD;  Location: [facility] ENDOSCOPY;  Service: Endoscopy;  Laterality: N/A;  . Paracentesis  09/13/2013    "just once" (09/19/2013)  . Carpal tunnel release Bilateral 1990's  . Knee arthroscopy w/ acl reconstruction Bilateral ?1990's  . Vaginal hysterectomy  1980  . Salpingoophorectomy Bilateral 1990's   Family History  Problem Relation Age of Onset  . Heart attack Mother   . Heart disease Mother   . Heart disease Father   . Heart attack Father    History  Substance Use Topics  . Smoking status: Never Smoker   . Smokeless tobacco: Never Used  . Alcohol Use: No   OB History   Grav Para Term Preterm Abortions TAB SAB Ect Mult Living  Review of Systems  All other systems reviewed and are negative.    Allergies  Codeine and Sulfa antibiotics  Home Medications   Current Outpatient Rx  Name  Route  Sig  Dispense  Refill  . acetaminophen (TYLENOL) 325 MG tablet   Oral   Take 1 tablet (325 mg total) by mouth every 6 (six) hours as needed for pain or fever.         Marland Kitchen aspirin EC 81 MG tablet   Oral   Take 81 mg by mouth daily.         Marland Kitchen docusate sodium (COLACE) 100 MG capsule   Oral   Take 100 mg by mouth daily as needed for constipation.         Marland Kitchen doxepin (SINEQUAN) 75 MG capsule   Oral   Take 75 mg by mouth at bedtime.          Marland Kitchen FLUoxetine (PROZAC) 40 MG capsule   Oral   Take 40 mg by mouth 2 (two) times daily. Per Dr.Pulis          . furosemide (LASIX) 40 MG tablet   Oral   Take 40 mg by mouth daily.         Marland Kitchen lactose free nutrition (BOOST PLUS) LIQD   Oral   Take 237 mLs by mouth as needed (Please offer to pt if she misses or refuses a meal).      0   . LORazepam (ATIVAN) 0.5 MG tablet   Oral   Take 0.5 mg by mouth 2 (two) times daily as needed for anxiety.         . Melatonin 3 MG CAPS   Oral   Take 1 capsule by mouth at bedtime.          . metoCLOPramide (REGLAN) 5 MG tablet   Oral   Take 1 tablet (5 mg total) by mouth 2 (two) times daily as needed (Nausea).         . Multiple Vitamins-Minerals (CERTA-VITE PO)   Oral   Take 1 tablet by mouth daily.         . nadolol (CORGARD) 20 MG tablet   Oral   Take 20 mg by mouth daily.         . ondansetron (ZOFRAN) 4 MG tablet   Oral   Take 4 mg by mouth every 6 (six) hours as needed for nausea.         . pantoprazole (PROTONIX) 40 MG tablet   Oral   Take 40 mg by mouth 2 (two) times daily.         . polyethylene glycol (MIRALAX / GLYCOLAX) packet   Oral   Take 17 g by mouth daily as needed (constipation).         Marland Kitchen spironolactone (ALDACTONE) 50 MG tablet   Oral   Take 100 mg by mouth daily.          Marland Kitchen albuterol (PROVENTIL) (2.5 MG/3ML) 0.083% nebulizer solution   Nebulization   Take 2.5 mg by nebulization 4 (four) times daily as needed for wheezing.         . feeding supplement, PRO-STAT SUGAR FREE 64, (PRO-STAT SUGAR FREE 64) LIQD      Take 30 mLs by mouth once per day after breakfast and offer one post dinner.   900 mL   4   . nitroGLYCERIN (NITROSTAT) 0.4 MG SL tablet   Sublingual   Place 0.4 mg under the  tongue every 5 (five) minutes as needed for chest pain.          Pulse 81  Temp(Src) 97.5 F (36.4 C)  Resp 16  SpO2 98% Physical Exam  Nursing note and vitals reviewed. Constitutional: She is oriented to person, place, and time. She appears well-developed and well-nourished. No distress.  Morbidly  obese  HENT:  Head: Normocephalic and atraumatic.  Eyes:  Normal appearance  Neck: Normal range of motion.  Cardiovascular: Normal rate and regular rhythm.   Pulmonary/Chest: Effort normal and breath sounds normal. No respiratory distress.  Musculoskeletal: Normal range of motion.  Neurological: She is alert and oriented to person, place, and time.  CN 3-12 intact.  No sensory deficits.  5/5 and equal upper and lower extremity strength.  Pt is able to hold BLE up against gravity. No past pointing.   Skin: Skin is warm and dry. No rash noted.  Psychiatric: She has a normal mood and affect. Her behavior is normal.    ED Course  Procedures (including critical care time) Labs Review Labs Reviewed  CBC WITH DIFFERENTIAL  COMPREHENSIVE METABOLIC PANEL   Imaging Review No results found.  EKG Interpretation   None       MDM   1. Medication side effect, initial encounter     Pt comes from ALF.  She takes ativan prn for anxiety and per her daughter, she has several side effects from this medication.  Took a dose last night and shortly after, she was unable to move her legs, because dizzy and clammy upon standing as nursing staff assisted her to the bathroom, and her daughter reports that she is altered currently.  These sx are typical.  Her daughter has been fighting patient's PCP to remove this medication from her list.  No recent illnesses or trauma.  Afebrile, NAD, dry mucous membranes, no focal neuro deficits, including LE weakness on exam, though patient does appear loopy.  Pt received 1L NS bolus which allowed me time to monitor and check basic labs.  Labs unremarkable and patient was reportedly back to her baseline mentation, per her daughter, 2 hours later.  Myself and nursing staff have ambulated.  She is depended but able to put weight on her legs.  She reports feeling better.  D/c'd back to ALF.  Advised PCP f/u.   Otilio Miu, PA-C 12/03/13 0844  Otilio Miu, PA-C 12/03/13 [phone]

## 2013-12-02 NOTE — ED Notes (Signed)
PTAR called  

## 2013-12-02 NOTE — ED Notes (Signed)
ptar here to take back to [facility]

## 2013-12-02 NOTE — ED Notes (Addendum)
Pt unable to safely ambulate more than three steps with 2 RNS and PA. Pt took three steps and said she could not ambulate any more without a walker, confirmed with PA that pt cannot ambulate safely and deemed necessary for pt to have PTAR take her back to [facility]. PA did not feel it was appropriate for pt's daughter to take her back to facility. Pt states she is at [facility] for regaining her strength, pt states she uses a Manufacturing engineer at the facility.

## 2013-12-02 NOTE — ED Notes (Signed)
This RN received a phone call from [facility] RN Duwayne Heck, wish to speak to provider. Charge RN took phone call and reviewed chart with RN. Informed pt is medically cleared to be discharged and will be en route to facility shortly by PTAR.

## 2013-12-02 NOTE — ED Notes (Signed)
Pt placed on RA, pt tolerating well.

## 2013-12-02 NOTE — ED Notes (Signed)
Pt is from [facility] nursing facility, pt c/o of left leg pain, pt reports she is able to move her left leg but is unable to put pressure on her left leg. Pt states this pain started last night at 1900. Pt also reports increased SOB that started tonight. Pt's 02 sats were reading 95% however EMS placed pt on 2L 02 and the pt states she felt decreased SOB. Pt is A&O X4.

## 2013-12-02 NOTE — ED Notes (Signed)
[name] leaving department as she has to go to work, will pick pt up later if she needs to, please call with updates.

## 2013-12-19 NOTE — ED Provider Notes (Signed)
Medical screening examination/treatment/procedure(s) were conducted as a shared visit with non-physician practitioner(s) and myself.  I personally evaluated the patient during the encounter.  Patient with acute AMS which appear to be mecdication related with previous episodes of same in the past. We have observed the patient in the ED and she has received IVF with resolution of AMS. Patient stable for d/c. Mom will f/lu with PCP to review prescribed medications and their potential side effects.    Brandt LoosenJulie Manases [place], MD 12/19/13 [phone]

## 2014-01-17 ENCOUNTER — Other Ambulatory Visit (HOSPITAL_COMMUNITY): Payer: Self-pay | Admitting: Internal Medicine

## 2014-01-17 DIAGNOSIS — R188 Other ascites: Secondary | ICD-10-CM

## 2014-01-20 ENCOUNTER — Ambulatory Visit (HOSPITAL_COMMUNITY)
Admission: RE | Admit: 2014-01-20 | Discharge: 2014-01-20 | Disposition: A | Discharge status: Home / Self Care | Payer: Medicare Other | Source: Ambulatory Visit | Attending: Internal Medicine | Admitting: Internal Medicine

## 2014-01-20 VITALS — BP 110/70

## 2014-01-20 DIAGNOSIS — R188 Other ascites: Secondary | ICD-10-CM | POA: Insufficient documentation

## 2014-01-20 DIAGNOSIS — K7460 Unspecified cirrhosis of liver: Secondary | ICD-10-CM | POA: Insufficient documentation

## 2014-01-20 NOTE — Procedures (Signed)
US guided therapeutic paracentesis performed yielding 6.7 liters yellow fluid. No immediate complications.

## 2014-02-12 ENCOUNTER — Other Ambulatory Visit (HOSPITAL_COMMUNITY): Payer: Self-pay | Admitting: Internal Medicine

## 2014-02-12 ENCOUNTER — Ambulatory Visit (HOSPITAL_COMMUNITY)
Admission: RE | Admit: 2014-02-12 | Discharge: 2014-02-12 | Disposition: A | Discharge status: Home / Self Care | Payer: Medicare Other | Source: Ambulatory Visit | Attending: Internal Medicine | Admitting: Internal Medicine

## 2014-02-12 DIAGNOSIS — R188 Other ascites: Secondary | ICD-10-CM

## 2014-02-12 NOTE — Procedures (Signed)
Successful US guided paracentesis from [facility].  Yielded 8.9 liters of yellow fluid.  No immediate complications.  Pt tolerated well.   Specimen was not sent for labs.  Pattricia BossMORGAN, Melina Mosteller D PA-C 02/12/2014 3:17 PM

## 2014-02-13 ENCOUNTER — Ambulatory Visit (HOSPITAL_COMMUNITY): Payer: Medicare Other

## 2014-02-14 ENCOUNTER — Encounter (HOSPITAL_COMMUNITY): Payer: Self-pay | Admitting: Emergency Medicine

## 2014-02-14 ENCOUNTER — Emergency Department (HOSPITAL_COMMUNITY)
Admission: EM | Admit: 2014-02-14 | Discharge: 2014-02-15 | Disposition: A | Discharge status: Home / Self Care | Payer: Medicare Other | Attending: Emergency Medicine | Admitting: Emergency Medicine

## 2014-02-14 DIAGNOSIS — F329 Major depressive disorder, single episode, unspecified: Secondary | ICD-10-CM | POA: Insufficient documentation

## 2014-02-14 DIAGNOSIS — Z8711 Personal history of peptic ulcer disease: Secondary | ICD-10-CM | POA: Insufficient documentation

## 2014-02-14 DIAGNOSIS — M1990 Unspecified osteoarthritis, unspecified site: Secondary | ICD-10-CM | POA: Insufficient documentation

## 2014-02-14 DIAGNOSIS — R0602 Shortness of breath: Secondary | ICD-10-CM | POA: Insufficient documentation

## 2014-02-14 DIAGNOSIS — F3289 Other specified depressive episodes: Secondary | ICD-10-CM | POA: Insufficient documentation

## 2014-02-14 DIAGNOSIS — K7460 Unspecified cirrhosis of liver: Secondary | ICD-10-CM | POA: Insufficient documentation

## 2014-02-14 DIAGNOSIS — Z7982 Long term (current) use of aspirin: Secondary | ICD-10-CM | POA: Insufficient documentation

## 2014-02-14 DIAGNOSIS — F411 Generalized anxiety disorder: Secondary | ICD-10-CM | POA: Insufficient documentation

## 2014-02-14 DIAGNOSIS — Z79899 Other long term (current) drug therapy: Secondary | ICD-10-CM | POA: Insufficient documentation

## 2014-02-14 DIAGNOSIS — K449 Diaphragmatic hernia without obstruction or gangrene: Secondary | ICD-10-CM | POA: Insufficient documentation

## 2014-02-14 DIAGNOSIS — K920 Hematemesis: Secondary | ICD-10-CM | POA: Insufficient documentation

## 2014-02-14 DIAGNOSIS — R188 Other ascites: Secondary | ICD-10-CM | POA: Insufficient documentation

## 2014-02-14 DIAGNOSIS — Z882 Allergy status to sulfonamides status: Secondary | ICD-10-CM | POA: Insufficient documentation

## 2014-02-14 DIAGNOSIS — I209 Angina pectoris, unspecified: Secondary | ICD-10-CM | POA: Insufficient documentation

## 2014-02-14 DIAGNOSIS — I10 Essential (primary) hypertension: Secondary | ICD-10-CM | POA: Insufficient documentation

## 2014-02-14 DIAGNOSIS — F341 Dysthymic disorder: Secondary | ICD-10-CM | POA: Insufficient documentation

## 2014-02-14 DIAGNOSIS — E669 Obesity, unspecified: Secondary | ICD-10-CM | POA: Insufficient documentation

## 2014-02-14 DIAGNOSIS — F319 Bipolar disorder, unspecified: Secondary | ICD-10-CM | POA: Insufficient documentation

## 2014-02-14 DIAGNOSIS — R109 Unspecified abdominal pain: Secondary | ICD-10-CM | POA: Insufficient documentation

## 2014-02-14 DIAGNOSIS — Z885 Allergy status to narcotic agent status: Secondary | ICD-10-CM | POA: Insufficient documentation

## 2014-02-14 DIAGNOSIS — R0789 Other chest pain: Secondary | ICD-10-CM | POA: Insufficient documentation

## 2014-02-14 DIAGNOSIS — Z8673 Personal history of transient ischemic attack (TIA), and cerebral infarction without residual deficits: Secondary | ICD-10-CM | POA: Insufficient documentation

## 2014-02-14 DIAGNOSIS — R112 Nausea with vomiting, unspecified: Secondary | ICD-10-CM | POA: Insufficient documentation

## 2014-02-14 DIAGNOSIS — M129 Arthropathy, unspecified: Secondary | ICD-10-CM | POA: Insufficient documentation

## 2014-02-14 DIAGNOSIS — R413 Other amnesia: Secondary | ICD-10-CM | POA: Insufficient documentation

## 2014-02-14 LAB — I-STAT CHEM 8, ED
BUN: 30 mg/dL — ABNORMAL HIGH (ref 6–23)
CREATININE: 1.3 mg/dL — AB (ref 0.50–1.10)
Calcium, Ion: 1.13 mmol/L (ref 1.13–1.30)
Chloride: 102 mEq/L (ref 96–112)
Glucose, Bld: 90 mg/dL (ref 70–99)
HCT: 39 % (ref 36.0–46.0)
Hemoglobin: 13.3 g/dL (ref 12.0–15.0)
Potassium: 4.8 mEq/L (ref 3.7–5.3)
SODIUM: 135 meq/L — AB (ref 137–147)
TCO2: 24 mmol/L (ref 0–100)

## 2014-02-14 LAB — CBC
HCT: 37.7 % (ref 36.0–46.0)
Hemoglobin: 12.3 g/dL (ref 12.0–15.0)
MCH: 30.6 pg (ref 26.0–34.0)
MCHC: 32.6 g/dL (ref 30.0–36.0)
MCV: 93.8 fL (ref 78.0–100.0)
Platelets: 93 10*3/uL — ABNORMAL LOW (ref 150–400)
RBC: 4.02 MIL/uL (ref 3.87–5.11)
RDW: 16.4 % — ABNORMAL HIGH (ref 11.5–15.5)
WBC: 4.5 10*3/uL (ref 4.0–10.5)

## 2014-02-14 LAB — PROTIME-INR
INR: 1.38 (ref 0.00–1.49)
Prothrombin Time: 16.6 seconds — ABNORMAL HIGH (ref 11.6–15.2)

## 2014-02-14 LAB — POC OCCULT BLOOD, ED: FECAL OCCULT BLD: NEGATIVE

## 2014-02-14 NOTE — ED Provider Notes (Addendum)
CSN: [id number]     Arrival date & time 02/14/14  2052 History   None    Chief Complaint  Patient presents with  . Hematemesis     (Consider location/radiation/quality/duration/timing/severity/associated sxs/prior Treatment) HPI Complains of hematemesis 2 episodes today each time approximately 1 tablespoon. Also complains of diffuse abdominal pain which is chronic and unchanged for several months. Last bowel movement today, normal. No fever. No treatment prior to coming here. She's not nauseated presently. No other associated symptoms. Past Medical History  Diagnosis Date  . Exogenous obesity   . Hypertension   . Anxiety and depression     chronic  . Hiatal hernia   . Atypical chest pain 06/10/03    normal 2 day adenosine cardiolite  . Memory changes   . Bipolar disorder   . Ascites   . Cirrhosis   . Anginal pain   . Exertional shortness of breath   . Shortness of breath     "all the time right now" (09/19/2013)  . Anemia   . History of blood transfusion 05/2013    "once; related to a stomach ulcer" (09/19/2013)  . Bleeding stomach ulcer 04/22/2013  . GERD (gastroesophageal reflux disease)   . Osteoarthritis   . Arthritis     "all over my body" (09/19/2013)  . Anxiety   . Depression   . Fatty liver disease, nonalcoholic   . CVA (cerebral infarction) 04/21/13    CCT c/w Left cerebellar infarct which appears subacute to remote   Past Surgical History  Procedure Laterality Date  . Tubal ligation  1977  . Cardiac catheterization  1990    normal cath  . Esophagogastroduodenoscopy N/A 04/22/2013    Procedure: ESOPHAGOGASTRODUODENOSCOPY (EGD);  Surgeon: Shirley FriarVincent C. Schooler, MD;  [facility];  Service: Endoscopy;  Laterality: N/A;  . Paracentesis  09/13/2013    "just once" (09/19/2013)  . Carpal tunnel release Bilateral 1990's  . Knee arthroscopy w/ acl reconstruction Bilateral ?1990's  . Vaginal hysterectomy  1980  . Salpingoophorectomy Bilateral 1990's   Family  History  Problem Relation Age of Onset  . Heart attack Mother   . Heart disease Mother   . Heart disease Father   . Heart attack Father    History  Substance Use Topics  . Smoking status: Never Smoker   . Smokeless tobacco: Never Used  . Alcohol Use: No   OB History   Grav Para Term Preterm Abortions TAB SAB Ect Mult Living                 Review of Systems  Gastrointestinal: Positive for nausea, vomiting and abdominal pain. Negative for blood in stool.       Hematemesis      Allergies  Codeine and Sulfa antibiotics  Home Medications   Current Outpatient Rx  Name  Route  Sig  Dispense  Refill  . acetaminophen (TYLENOL) 325 MG tablet   Oral   Take 650 mg by mouth every 6 (six) hours as needed (pain).         Marland Kitchen. acetaminophen (TYLENOL) 325 MG tablet   Oral   Take 325 mg by mouth 2 (two) times daily. scheduled         . albuterol (PROVENTIL) (2.5 MG/3ML) 0.083% nebulizer solution   Nebulization   Take 2.5 mg by nebulization 4 (four) times daily as needed for wheezing.         . Amino Acids-Protein Hydrolys (FEEDING SUPPLEMENT, PRO-STAT SUGAR FREE 64,) LIQD  Oral   Take 30 mLs by mouth See admin instructions. Take 30 mls by mouth every day after breakfast and offer a dose after dinner         . aspirin EC 81 MG tablet   Oral   Take 81 mg by mouth daily.         . busPIRone (BUSPAR) 5 MG tablet   Oral   Take 5 mg by mouth at bedtime.         . docusate sodium (COLACE) 100 MG capsule   Oral   Take 100 mg by mouth daily as needed (constipation).          Marland Kitchen doxepin (SINEQUAN) 75 MG capsule   Oral   Take 75 mg by mouth at bedtime.          Marland Kitchen FLUoxetine (PROZAC) 40 MG capsule   Oral   Take 40 mg by mouth 2 (two) times daily. Per Dr.Pulis         . furosemide (LASIX) 40 MG tablet   Oral   Take 40 mg by mouth daily.         Marland Kitchen lactulose (CHRONULAC) 10 GM/15ML solution   Oral   Take 20 g by mouth 3 (three) times a week. Take every  Tuesday, Thursday and Saturday morning (watch for diarrhea)         . Melatonin 3 MG CAPS   Oral   Take 3 mg by mouth at bedtime.          . metoCLOPramide (REGLAN) 5 MG tablet   Oral   Take 5 mg by mouth daily with breakfast.         . Multiple Vitamins-Minerals (CERTAVITE/ANTIOXIDANTS) TABS   Oral   Take 1 tablet by mouth daily. Take on an empty stomach (10am)         . nadolol (CORGARD) 20 MG tablet   Oral   Take 20 mg by mouth daily.         . nitroGLYCERIN (NITROSTAT) 0.4 MG SL tablet   Sublingual   Place 0.4 mg under the tongue every 5 (five) minutes as needed for chest pain.         Marland Kitchen ondansetron (ZOFRAN) 4 MG tablet   Oral   Take 4 mg by mouth every 12 (twelve) hours as needed for nausea or vomiting.          . pantoprazole (PROTONIX) 40 MG tablet   Oral   Take 40 mg by mouth 2 (two) times daily.         . polyethylene glycol (MIRALAX / GLYCOLAX) packet   Oral   Take 17 g by mouth daily as needed (constipation). Mix with 8 oz of fluid and drink         . spironolactone (ALDACTONE) 50 MG tablet   Oral   Take 100 mg by mouth daily.           BP 102/52  Pulse 79  Temp(Src) 97.5 F (36.4 C) (Oral)  Resp 13  Ht 5\' 6"  (1.676 m)  Wt 244 lb (110.678 kg)  BMI 39.40 kg/m2  SpO2 98% Physical Exam  Nursing note and vitals reviewed. Constitutional:  Chronically ill-appearing  HENT:  Head: Normocephalic and atraumatic.  Eyes: Conjunctivae are normal. Pupils are equal, round, and reactive to light.  Neck: Neck supple. No tracheal deviation present. No thyromegaly present.  Cardiovascular: Normal rate and regular rhythm.   No murmur heard. Pulmonary/Chest: Effort normal and  breath sounds normal.  Abdominal: Soft. Bowel sounds are normal. She exhibits no distension and no mass. There is tenderness. There is no guarding.  Obese, tender epigastrium  Genitourinary: Guaiac negative stool.  Normal tone brown stool on examining finger Hemoccult  negative  Musculoskeletal: Normal range of motion. She exhibits no edema and no tenderness.  Neurological: She is alert. Coordination normal.  Skin: Skin is warm and dry. No rash noted.  Psychiatric: She has a normal mood and affect.    ED Course  Procedures (including critical care time) Labs Review Labs Reviewed  CBC - Abnormal; Notable for the following:    RDW 16.4 (*)    Platelets 93 (*)    All other components within normal limits  I-STAT CHEM 8, ED - Abnormal; Notable for the following:    Sodium 135 (*)    BUN 30 (*)    Creatinine, Ser 1.30 (*)    All other components within normal limits   Imaging Review No results found.   EKG Interpretation None      Results for orders placed during the hospital encounter of 02/14/14  CBC      Result Value Ref Range   WBC 4.5  4.0 - 10.5 K/uL   RBC 4.02  3.87 - 5.11 MIL/uL   Hemoglobin 12.3  12.0 - 15.0 g/dL   HCT 16.1  09.6 - 04.5 %   MCV 93.8  78.0 - 100.0 fL   MCH 30.6  26.0 - 34.0 pg   MCHC 32.6  30.0 - 36.0 g/dL   RDW 40.9 (*) 81.1 - 91.4 %   Platelets 93 (*) 150 - 400 K/uL  HEPATIC FUNCTION PANEL      Result Value Ref Range   Total Protein 5.8 (*) 6.0 - 8.3 g/dL   Albumin 2.1 (*) 3.5 - 5.2 g/dL   AST 69 (*) 0 - 37 U/L   ALT 28  0 - 35 U/L   Alkaline Phosphatase 82  39 - 117 U/L   Total Bilirubin 1.1  0.3 - 1.2 mg/dL   Bilirubin, Direct 0.4 (*) 0.0 - 0.3 mg/dL   Indirect Bilirubin 0.7  0.3 - 0.9 mg/dL  PROTIME-INR      Result Value Ref Range   Prothrombin Time 16.6 (*) 11.6 - 15.2 seconds   INR 1.38  0.00 - 1.49  I-STAT CHEM 8, ED      Result Value Ref Range   Sodium 135 (*) 137 - 147 mEq/L   Potassium 4.8  3.7 - 5.3 mEq/L   Chloride 102  96 - 112 mEq/L   BUN 30 (*) 6 - 23 mg/dL   Creatinine, Ser 7.82 (*) 0.50 - 1.10 mg/dL   Glucose, Bld 90  70 - 99 mg/dL   Calcium, Ion 9.56  2.13 - 1.30 mmol/L   TCO2 24  0 - 100 mmol/L   Hemoglobin 13.3  12.0 - 15.0 g/dL   HCT 08.6  57.8 - 46.9 %  POC OCCULT BLOOD, ED       Result Value Ref Range   Fecal Occult Bld NEGATIVE  NEGATIVE   US Paracentesis  02/12/2014   CLINICAL DATA:  End stage liver disease, recurrent ascites, request for therapeutic paracentesis.  EXAM: ULTRASOUND GUIDED therapeutic PARACENTESIS  COMPARISON:  None.  PROCEDURE: An ultrasound guided paracentesis was thoroughly discussed with the patient and questions answered. The benefits, risks, alternatives and complications were also discussed. The patient understands and wishes to proceed with the procedure. Written  consent was obtained.  Ultrasound was performed to localize and mark an adequate pocket of fluid in the left lower quadrant of the abdomen. The area was then prepped and draped in the normal sterile fashion. 1% Lidocaine was used for local anesthesia. Under ultrasound guidance a 19 gauge Yueh catheter was introduced. Paracentesis was performed. The catheter was removed and a dressing applied.  Complications: None.  FINDINGS: A total of approximately 8.9 liters of yellow fluid was removed. A fluid sample was not sent for laboratory analysis.  IMPRESSION: Successful ultrasound guided paracentesis yielding 8.9 liters of ascites.  Read By:  Pattricia Boss PA-C   Electronically Signed   By: Maryclare Bean M.D.   On: 02/12/2014 15:22   US Paracentesis  01/20/2014   CLINICAL DATA:  Cirrhosis, recurrent ascites. Request is made for therapeutic paracentesis.  EXAM: ULTRASOUND GUIDED THERAPEUTIC PARACENTESIS  COMPARISON:  PREVIOUS PARACENTESIS ON 10/15/2013  PROCEDURE: An ultrasound guided paracentesis was thoroughly discussed with the patient and questions answered. The benefits, risks, alternatives and complications were also discussed. The patient understands and wishes to proceed with the procedure. Written consent was obtained.  Ultrasound was performed to localize and mark an adequate pocket of fluid in the left lower quadrant of the abdomen. The area was then prepped and draped in the normal sterile  fashion. 1% Lidocaine was used for local anesthesia. Under ultrasound guidance a 19 gauge Yueh catheter was introduced. Paracentesis was performed. The catheter was removed and a dressing applied.  Complications: None.  FINDINGS: A total of approximately 6.7 liters of yellow fluid was removed.  IMPRESSION: Successful ultrasound guided therapeutic paracentesis yielding 6.7 liters of ascites.  Read by: Jeananne Rama ,P.A.-C.   Electronically Signed   By: Malachy Moan M.D.   On: 01/20/2014 11:48    Date: 02/15/2014  Rate: 80  Rhythm: normal sinus rhythm  QRS Axis: normal  Intervals: normal  ST/T Wave abnormalities: nonspecific T wave changes  Conduction Disutrbances:none  Narrative Interpretation:   Old EKG Reviewed: Low-voltage diffusely, no significant change from 12/02/2013 interpreted by me  12:20 a.m. patient resting comfortably. Patient resting comfortably  MDM  Patient's blood pressure, hemoglobin and platelet count is at baseline. Case discussed with Dr.Tisovec plan stop aspirin until her appointment with Dr.Russo on 02/1014 She is going home to assisted living where she will be monitored Final diagnoses:  None   Diagnosis #1 hematemesis #2 hypotension #3thrombocytopenia #4 renal insufficiency     Doug Sou, MD 02/15/14 1610  Doug Sou, MD 02/15/14 [phone]

## 2014-02-14 NOTE — ED Notes (Addendum)
[facility] here for The Pepsihemataemesis. Denies nausea at this time. Given ODT zofran at 1500 and 2030. H/o GI ulcers. Denied pain prior to arrival, "always sob, nothing new", (denies: diarrhea or dizziness), (denies: feeling unusually cold or weak), alert, NAD, calm, interactive. Developed back cramp upon moving from EMS to ED stretcher. Described as bright red. No IV on arrival. H/o similar, chronic nausea. Seen by PCP (Dr. Timothy Lassousso) this week for the same. Recent (and recurrent) paracentesis. Ascites noted to abd and ankles.

## 2014-02-14 NOTE — ED Notes (Signed)
[facility] discomfort resolved. Denies pain or nausea.

## 2014-02-14 NOTE — ED Notes (Signed)
[facility]

## 2014-02-15 LAB — HEPATIC FUNCTION PANEL
ALBUMIN: 2.1 g/dL — AB (ref 3.5–5.2)
ALT: 28 U/L (ref 0–35)
AST: 69 U/L — ABNORMAL HIGH (ref 0–37)
Alkaline Phosphatase: 82 U/L (ref 39–117)
BILIRUBIN DIRECT: 0.4 mg/dL — AB (ref 0.0–0.3)
Indirect Bilirubin: 0.7 mg/dL (ref 0.3–0.9)
TOTAL PROTEIN: 5.8 g/dL — AB (ref 6.0–8.3)
Total Bilirubin: 1.1 mg/dL (ref 0.3–1.2)

## 2014-02-15 NOTE — Discharge Instructions (Signed)
Hematemesis Stop taking aspirin until you or further advised by Dr.Russo at your scheduled appointment on 02/18/2014. Return if you vomit significantly more blood or if you feel worse for any reason This condition is the vomiting of blood. CAUSES  This can happen if you have a peptic ulcer or an irritation of the throat, stomach, or small bowel. Vomiting over and over again or swallowing blood from a nosebleed, coughing or facial injury can also result in bloody vomit. Anti-inflammatory pain medicines are a common cause of this potentially dangerous condition. The most serious causes of vomiting blood include:  Ulcers (a bacteria called H. pylori is common cause of ulcers).  Clotting problems.  Alcoholism.  Cirrhosis. TREATMENT  Treatment depends on the cause and the severity of the bleeding. Small amounts of blood streaks in the vomit is not the same as vomiting large amounts of bloody or dark, coffee grounds-like material. Weakness, fainting, dehydration, anemia, and continued alcohol or drug use increase the risk. Examination may include blood, vomit, or stool tests. The presence of bloody or dark stool that tests positive for blood (Hemoccult) means the bleeding has been going on for some time. Endoscopy and imaging studies may be done. Emergency treatment may include:  IV medicines or fluids.  Blood transfusions.  Surgery. Hospital care is required for high risk patients or when IV fluids or blood is needed. Upper GI bleeding can cause shock and death if not controlled. HOME CARE INSTRUCTIONS   Your treatment does not require hospital care at this time.  Remain at rest until your condition improves.  Drink clear liquids as tolerated.  Avoid:  Alcohol.  Nicotine.  Aspirin.  Any other anti-inflammatory medicine (ibuprofen, naproxen, and many others).  Medications to suppress stomach acid or vomiting may be needed. Take all your medicine as prescribed.  Be sure to see your  caregiver for follow-up as recommended. SEEK IMMEDIATE MEDICAL CARE IF:   You have repeated vomiting, dehydration, fainting, or extreme weakness.  You are vomiting large amounts of bloody or dark material.  You pass large, dark or bloody stools. Document Released: 01/05/2005 Document Revised: 02/20/2012 Document Reviewed: 01/21/2009 [facility] Patient Information 2014 [place] [place].

## 2014-02-15 NOTE — ED Notes (Signed)
Pt alert, NAD, calm, interactive, skin W&D, resps e/u, no emesis while in ED, denies pain or nausea, admits to "soreness", [facility] "OK and ready to go".

## 2014-02-19 ENCOUNTER — Other Ambulatory Visit (HOSPITAL_COMMUNITY): Payer: Self-pay | Admitting: Internal Medicine

## 2014-02-19 DIAGNOSIS — K7460 Unspecified cirrhosis of liver: Secondary | ICD-10-CM

## 2014-02-20 ENCOUNTER — Ambulatory Visit (HOSPITAL_COMMUNITY)
Admission: RE | Admit: 2014-02-20 | Discharge: 2014-02-20 | Disposition: A | Discharge status: Home / Self Care | Payer: Medicare Other | Source: Ambulatory Visit | Attending: Internal Medicine | Admitting: Internal Medicine

## 2014-02-20 DIAGNOSIS — K7460 Unspecified cirrhosis of liver: Secondary | ICD-10-CM | POA: Insufficient documentation

## 2014-02-20 NOTE — Procedures (Signed)
Successful US guided paracentesis from [facility].  Yielded 7.2 liters of amber colored fluid.  No immediate complications.  Pt tolerated well.   Specimen was not sent for labs.  Pattricia BossMORGAN, Eastyn Dattilo D PA-C 02/20/2014 2:35 PM

## 2014-03-02 ENCOUNTER — Emergency Department (HOSPITAL_COMMUNITY): Payer: Medicare Other

## 2014-03-02 ENCOUNTER — Emergency Department (HOSPITAL_COMMUNITY)
Admission: EM | Admit: 2014-03-02 | Discharge: 2014-03-03 | Disposition: A | Discharge status: Home / Self Care | Payer: Medicare Other | Attending: Emergency Medicine | Admitting: Emergency Medicine

## 2014-03-02 ENCOUNTER — Encounter (HOSPITAL_COMMUNITY): Payer: Self-pay | Admitting: Emergency Medicine

## 2014-03-02 DIAGNOSIS — R142 Eructation: Secondary | ICD-10-CM | POA: Insufficient documentation

## 2014-03-02 DIAGNOSIS — I10 Essential (primary) hypertension: Secondary | ICD-10-CM | POA: Insufficient documentation

## 2014-03-02 DIAGNOSIS — Z7982 Long term (current) use of aspirin: Secondary | ICD-10-CM | POA: Insufficient documentation

## 2014-03-02 DIAGNOSIS — Z885 Allergy status to narcotic agent status: Secondary | ICD-10-CM | POA: Insufficient documentation

## 2014-03-02 DIAGNOSIS — K7460 Unspecified cirrhosis of liver: Secondary | ICD-10-CM | POA: Insufficient documentation

## 2014-03-02 DIAGNOSIS — R141 Gas pain: Secondary | ICD-10-CM | POA: Insufficient documentation

## 2014-03-02 DIAGNOSIS — R188 Other ascites: Secondary | ICD-10-CM | POA: Insufficient documentation

## 2014-03-02 DIAGNOSIS — E669 Obesity, unspecified: Secondary | ICD-10-CM | POA: Insufficient documentation

## 2014-03-02 DIAGNOSIS — D649 Anemia, unspecified: Secondary | ICD-10-CM | POA: Insufficient documentation

## 2014-03-02 DIAGNOSIS — R0602 Shortness of breath: Secondary | ICD-10-CM | POA: Insufficient documentation

## 2014-03-02 DIAGNOSIS — Z79899 Other long term (current) drug therapy: Secondary | ICD-10-CM | POA: Insufficient documentation

## 2014-03-02 DIAGNOSIS — K449 Diaphragmatic hernia without obstruction or gangrene: Secondary | ICD-10-CM | POA: Insufficient documentation

## 2014-03-02 DIAGNOSIS — M259 Joint disorder, unspecified: Secondary | ICD-10-CM | POA: Insufficient documentation

## 2014-03-02 DIAGNOSIS — R109 Unspecified abdominal pain: Secondary | ICD-10-CM

## 2014-03-02 DIAGNOSIS — R413 Other amnesia: Secondary | ICD-10-CM | POA: Insufficient documentation

## 2014-03-02 DIAGNOSIS — K219 Gastro-esophageal reflux disease without esophagitis: Secondary | ICD-10-CM | POA: Insufficient documentation

## 2014-03-02 DIAGNOSIS — F319 Bipolar disorder, unspecified: Secondary | ICD-10-CM | POA: Insufficient documentation

## 2014-03-02 DIAGNOSIS — F341 Dysthymic disorder: Secondary | ICD-10-CM | POA: Insufficient documentation

## 2014-03-02 DIAGNOSIS — Z8673 Personal history of transient ischemic attack (TIA), and cerebral infarction without residual deficits: Secondary | ICD-10-CM | POA: Insufficient documentation

## 2014-03-02 DIAGNOSIS — Z8719 Personal history of other diseases of the digestive system: Secondary | ICD-10-CM | POA: Insufficient documentation

## 2014-03-02 DIAGNOSIS — R0789 Other chest pain: Secondary | ICD-10-CM | POA: Insufficient documentation

## 2014-03-02 DIAGNOSIS — R143 Flatulence: Secondary | ICD-10-CM

## 2014-03-02 DIAGNOSIS — I209 Angina pectoris, unspecified: Secondary | ICD-10-CM | POA: Insufficient documentation

## 2014-03-02 DIAGNOSIS — R079 Chest pain, unspecified: Secondary | ICD-10-CM | POA: Insufficient documentation

## 2014-03-02 DIAGNOSIS — Z882 Allergy status to sulfonamides status: Secondary | ICD-10-CM | POA: Insufficient documentation

## 2014-03-02 LAB — CBC WITH DIFFERENTIAL/PLATELET
Basophils Absolute: 0 10*3/uL (ref 0.0–0.1)
Basophils Relative: 0 % (ref 0–1)
Eosinophils Absolute: 0.1 10*3/uL (ref 0.0–0.7)
Eosinophils Relative: 1 % (ref 0–5)
HEMATOCRIT: 36.1 % (ref 36.0–46.0)
HEMOGLOBIN: 12.4 g/dL (ref 12.0–15.0)
LYMPHS ABS: 0.8 10*3/uL (ref 0.7–4.0)
LYMPHS PCT: 11 % — AB (ref 12–46)
MCH: 31.2 pg (ref 26.0–34.0)
MCHC: 34.3 g/dL (ref 30.0–36.0)
MCV: 90.7 fL (ref 78.0–100.0)
MONO ABS: 0.7 10*3/uL (ref 0.1–1.0)
Monocytes Relative: 10 % (ref 3–12)
NEUTROS ABS: 5.8 10*3/uL (ref 1.7–7.7)
Neutrophils Relative %: 79 % — ABNORMAL HIGH (ref 43–77)
Platelets: 114 10*3/uL — ABNORMAL LOW (ref 150–400)
RBC: 3.98 MIL/uL (ref 3.87–5.11)
RDW: 16.3 % — AB (ref 11.5–15.5)
WBC: 7.4 10*3/uL (ref 4.0–10.5)

## 2014-03-02 LAB — COMPREHENSIVE METABOLIC PANEL
ALT: 35 U/L (ref 0–35)
AST: 84 U/L — AB (ref 0–37)
Albumin: 2.2 g/dL — ABNORMAL LOW (ref 3.5–5.2)
Alkaline Phosphatase: 88 U/L (ref 39–117)
BUN: 42 mg/dL — ABNORMAL HIGH (ref 6–23)
CHLORIDE: 95 meq/L — AB (ref 96–112)
CO2: 18 meq/L — AB (ref 19–32)
CREATININE: 1.38 mg/dL — AB (ref 0.50–1.10)
Calcium: 9 mg/dL (ref 8.4–10.5)
GFR, EST AFRICAN AMERICAN: 45 mL/min — AB (ref >90)
GFR, EST NON AFRICAN AMERICAN: 39 mL/min — AB (ref >90)
GLUCOSE: 95 mg/dL (ref 70–99)
Potassium: 4.9 mEq/L (ref 3.7–5.3)
Sodium: 132 mEq/L — ABNORMAL LOW (ref 137–147)
Total Bilirubin: 1.9 mg/dL — ABNORMAL HIGH (ref 0.3–1.2)
Total Protein: 6.1 g/dL (ref 6.0–8.3)

## 2014-03-02 LAB — PROTIME-INR
INR: 1.42 (ref 0.00–1.49)
PROTHROMBIN TIME: 17 s — AB (ref 11.6–15.2)

## 2014-03-02 LAB — TROPONIN I: Troponin I: <0.3 ng/mL (ref <0.30)

## 2014-03-02 LAB — LIPASE, BLOOD: Lipase: 41 U/L (ref 11–59)

## 2014-03-02 MED ORDER — ONDANSETRON HCL 4 MG/2ML IJ SOLN
4.0000 mg | Freq: Once | INTRAMUSCULAR | Status: AC
  Administered 2014-03-02: 4 mg via INTRAVENOUS
  Filled 2014-03-02: qty 2

## 2014-03-02 MED ORDER — SODIUM CHLORIDE 0.9 % IV SOLN
INTRAVENOUS | Status: DC
  Administered 2014-03-02: 20:00:00 via INTRAVENOUS

## 2014-03-02 MED ORDER — MORPHINE SULFATE 2 MG/ML IJ SOLN
2.0000 mg | Freq: Once | INTRAMUSCULAR | Status: AC
  Administered 2014-03-02: 2 mg via INTRAVENOUS
  Filled 2014-03-02: qty 1

## 2014-03-02 MED ORDER — IOHEXOL 350 MG/ML SOLN
70.0000 mL | Freq: Once | INTRAVENOUS | Status: AC | PRN
  Administered 2014-03-02: 70 mL via INTRAVENOUS

## 2014-03-02 MED ORDER — SODIUM CHLORIDE 0.9 % IV BOLUS (SEPSIS): 1000.0000 mL | Freq: Once | INTRAVENOUS | Status: DC

## 2014-03-02 NOTE — ED Notes (Signed)
MD at bedside. 

## 2014-03-02 NOTE — ED Notes (Signed)
Pt not in room, pt in XRAY.

## 2014-03-02 NOTE — ED Provider Notes (Signed)
CSN: [id number]     Arrival date & time 03/02/14  1801 History   First MD Initiated Contact with Patient 03/02/14 1812     Chief Complaint  Patient presents with  . Breast Pain     (Consider location/radiation/quality/duration/timing/severity/associated sxs/prior Treatment) The history is provided by the patient.   [name] is a 68 y.o. female who states that she has had pain beneath her left breast that radiates to her left jaw, since this morning. The pain is persistent. She denies weakness, dizziness, nausea, vomiting, diaphoresis, cough, or shortness of breath. She has not had any known cardiac disease. She takes nitroglycerin occasionally for "angina" even though she has had a "negative" workup for heart disease in the past. She has cirrhosis with ascites, but occasionally requires a paracentesis. She states that she has had 5 paracentesis procedures, this year. She denies abdominal pain, or back pain. There are no other known modifying factors.   Past Medical History  Diagnosis Date  . Exogenous obesity   . Hypertension   . Anxiety and depression     chronic  . Hiatal hernia   . Atypical chest pain 06/10/03    normal 2 day adenosine cardiolite  . Memory changes   . Bipolar disorder   . Ascites   . Cirrhosis   . Anginal pain   . Exertional shortness of breath   . Shortness of breath     "all the time right now" (09/19/2013)  . Anemia   . History of blood transfusion 05/2013    "once; related to a stomach ulcer" (09/19/2013)  . Bleeding stomach ulcer 04/22/2013  . GERD (gastroesophageal reflux disease)   . Osteoarthritis   . Arthritis     "all over my body" (09/19/2013)  . Anxiety   . Depression   . Fatty liver disease, nonalcoholic   . CVA (cerebral infarction) 04/21/13    CCT c/w Left cerebellar infarct which appears subacute to remote   Past Surgical History  Procedure Laterality Date  . Tubal ligation  1977  . Cardiac catheterization  1990    normal cath   . Esophagogastroduodenoscopy N/A 04/22/2013    Procedure: ESOPHAGOGASTRODUODENOSCOPY (EGD);  Surgeon: Shirley FriarVincent C. Schooler, MD;  Location[facility];  Service: Endoscopy;  Laterality: N/A;  . Paracentesis  09/13/2013    "just once" (09/19/2013)  . Carpal tunnel release Bilateral 1990's  . Knee arthroscopy w/ acl reconstruction Bilateral ?1990's  . Vaginal hysterectomy  1980  . Salpingoophorectomy Bilateral 1990's   Family History  Problem Relation Age of Onset  . Heart attack Mother   . Heart disease Mother   . Heart disease Father   . Heart attack Father    History  Substance Use Topics  . Smoking status: Never Smoker   . Smokeless tobacco: Never Used  . Alcohol Use: No   OB History   Grav Para Term Preterm Abortions TAB SAB Ect Mult Living                 Review of Systems  All other systems reviewed and are negative.      Allergies  Codeine and Sulfa antibiotics  Home Medications   Current Outpatient Rx  Name  Route  Sig  Dispense  Refill  . acetaminophen (TYLENOL) 325 MG tablet   Oral   Take 325 mg by mouth See admin instructions. Take 1 tablet (325 mg) by mouth 2 times daily scheduled.  May also take an additional 2 tablets every  6 hours as needed for pain         . albuterol (PROVENTIL) (2.5 MG/3ML) 0.083% nebulizer solution   Nebulization   Take 2.5 mg by nebulization 4 (four) times daily as needed for wheezing.         . Amino Acids-Protein Hydrolys (FEEDING SUPPLEMENT, PRO-STAT SUGAR FREE 64,) LIQD   Oral   Take 30 mLs by mouth See admin instructions. Take 30 mls by mouth every day after breakfast and offer a dose after dinner         . busPIRone (BUSPAR) 5 MG tablet   Oral   Take 5 mg by mouth at bedtime.         . docusate sodium (COLACE) 100 MG capsule   Oral   Take 100 mg by mouth daily as needed (constipation).          Marland Kitchen doxepin (SINEQUAN) 75 MG capsule   Oral   Take 75 mg by mouth at bedtime.          Marland Kitchen FLUoxetine (PROZAC)  40 MG capsule   Oral   Take 40 mg by mouth 2 (two) times daily. Per Dr.Pulis         . furosemide (LASIX) 20 MG tablet   Oral   Take 40-60 mg by mouth See admin instructions. Take 2 tablets (40 mg) every other day in the morning alternating with 3 tablets ( 60 mg) on other days         . lactulose, encephalopathy, (ENULOSE) 10 GM/15ML SOLN   Oral   Take 20 g by mouth 3 (three) times a week. Every Tuesday, Thursday and Saturday (watch for diarrhea)         . Melatonin 3 MG CAPS   Oral   Take 3 mg by mouth at bedtime.          . metoCLOPramide (REGLAN) 5 MG tablet   Oral   Take 5 mg by mouth daily with breakfast.         . Multiple Vitamins-Minerals (CERTAVITE/ANTIOXIDANTS) TABS   Oral   Take 1 tablet by mouth daily. Take on an empty stomach (10am)         . nitroGLYCERIN (NITROSTAT) 0.4 MG SL tablet   Sublingual   Place 0.4 mg under the tongue every 5 (five) minutes as needed for chest pain.         Marland Kitchen ondansetron (ZOFRAN) 4 MG tablet   Oral   Take 4 mg by mouth every 8 (eight) hours as needed for nausea or vomiting.          . pantoprazole (PROTONIX) 40 MG tablet   Oral   Take 40 mg by mouth 2 (two) times daily.         . polyethylene glycol (MIRALAX / GLYCOLAX) packet   Oral   Take 17 g by mouth daily as needed (constipation). Mix with 8 oz of fluid and drink         . spironolactone (ALDACTONE) 50 MG tablet   Oral   Take 100 mg by mouth daily.          Marland Kitchen aspirin EC 81 MG tablet   Oral   Take 81 mg by mouth daily.          BP 96/50  Pulse 105  Temp(Src) 97.5 F (36.4 C) (Oral)  Resp 15  SpO2 100% Physical Exam  Nursing note and vitals reviewed. Constitutional: She is oriented to person, place, and time.  She appears well-developed. She appears distressed (she is uncomfortable).  Obese  HENT:  Head: Normocephalic and atraumatic.  Eyes: Conjunctivae and EOM are normal. Pupils are equal, round, and reactive to light.  Neck: Normal  range of motion and phonation normal. Neck supple.  Cardiovascular: Normal rate, regular rhythm and intact distal pulses.   Pulmonary/Chest: Effort normal and breath sounds normal. No respiratory distress. She has no wheezes. She exhibits no tenderness.  Abdominal: Soft. She exhibits distension. She exhibits no mass. There is no tenderness. There is no guarding.  Musculoskeletal: Normal range of motion. She exhibits edema (2+, symmetric). She exhibits no tenderness.  Neurological: She is alert and oriented to person, place, and time. She exhibits normal muscle tone.  Skin: Skin is warm and dry.  Psychiatric: She has a normal mood and affect. Her behavior is normal. Judgment and thought content normal.    ED Course  Procedures (including critical care time)  Medications  morphine 2 MG/ML injection 2 mg (2 mg Intravenous Given 03/02/14 2100)  ondansetron (ZOFRAN) injection 4 mg (4 mg Intravenous Given 03/02/14 2058)  morphine 2 MG/ML injection 2 mg (2 mg Intravenous Given 03/02/14 2226)  iohexol (OMNIPAQUE) 350 MG/ML injection 70 mL (70 mLs Intravenous Contrast Given 03/02/14 2309)    Patient Vitals for the past 24 hrs:  BP Temp Temp src Pulse Resp SpO2  03/03/14 0030 96/50 mmHg - - 105 15 100 %  03/02/14 2345 102/48 mmHg - - - 16 -  03/02/14 2330 114/66 mmHg - - 103 16 98 %  03/02/14 2324 130/89 mmHg - - 102 16 100 %  03/02/14 2233 95/49 mmHg - - 106 18 100 %  03/02/14 2215 93/69 mmHg - - 106 20 100 %  03/02/14 2214 98/54 mmHg - - - - -  03/02/14 2203 86/54 mmHg 97.5 F (36.4 C) Oral - 24 99 %  03/02/14 2145 104/57 mmHg - - - 16 -  03/02/14 2130 103/56 mmHg - - 104 16 98 %  03/02/14 2115 102/70 mmHg - - 105 18 100 %    12:41 AM Reevaluation with update and discussion. After initial assessment and treatment, an updated evaluation reveals . She has mild, epigastric discomfort at this time, but no chest pain. It is discussed with patient and daughter, all questions answered. I offered her  a prescription for pain medicine, but she declined. Evelene Roussin L      Labs Review Labs Reviewed  CBC WITH DIFFERENTIAL - Abnormal; Notable for the following:    RDW 16.3 (*)    Platelets 114 (*)    Neutrophils Relative % 79 (*)    Lymphocytes Relative 11 (*)    All other components within normal limits  COMPREHENSIVE METABOLIC PANEL - Abnormal; Notable for the following:    Sodium 132 (*)    Chloride 95 (*)    CO2 18 (*)    BUN 42 (*)    Creatinine, Ser 1.38 (*)    Albumin 2.2 (*)    AST 84 (*)    Total Bilirubin 1.9 (*)    GFR calc non Af Amer 39 (*)    GFR calc Af Amer 45 (*)    All other components within normal limits  PROTIME-INR - Abnormal; Notable for the following:    Prothrombin Time 17.0 (*)    All other components within normal limits  LIPASE, BLOOD  TROPONIN I   Imaging Review Dg Chest 2 View  03/02/2014   CLINICAL DATA:  Chest  pressure and shortness of breath. Left-sided chest pain.  EXAM: CHEST  2 VIEW  COMPARISON:  10/11/2013  FINDINGS: The cardiac silhouette remains mildly enlarged. Thoracic aortic calcification is noted. There is mild persistent retrocardiac opacity in the left lower lobe, improved from the prior study. The right lung is clear. No definite pleural effusion or pneumothorax is identified. No acute osseous abnormality is identified.  IMPRESSION: Mild left lower lobe opacity, less than on the prior study. This could reflect atelectasis or infectious infiltrate.   Electronically Signed   By: Sebastian Ache   On: 03/02/2014 20:08   Ct Angio Chest Pe W/cm &/or Wo Cm  03/03/2014   CLINICAL DATA:  Severe left chest pain and shortness of breath today.  EXAM: CT ANGIOGRAPHY CHEST WITH CONTRAST  TECHNIQUE: Multidetector CT imaging of the chest was performed using the standard protocol during bolus administration of intravenous contrast. Multiplanar CT image reconstructions and MIPs were obtained to evaluate the vascular anatomy.  CONTRAST:  70mL OMNIPAQUE  IOHEXOL 350 MG/ML SOLN  COMPARISON:  DG CHEST 2 VIEW dated 03/02/2014  FINDINGS: Suboptimal bolus timing, this decreases sensitivity for segmental to subsegmental pulmonary arterial filling defects. No central pulmonary arterial filling defects. Main pulmonary artery is not enlarged.  Heart size is normal, no right heart strain. Thoracic aorta is normal in course and caliber with mild calcific atherosclerosis. Pericardium is unremarkable.  Small hiatal hernia with associated passive atelectasis, no pleural effusions or focal consolidations. Tracheobronchial tree is patent, no pneumothorax.  Included view of the abdomen demonstrates at least moderate amount of ascites, which tracks along the hiatal hernia. Partially imaged cirrhosis.  Severe degenerative change of the included cervical spine and right shoulder, to lesser extent left shoulder.  Review of the MIP images confirms the above findings.  IMPRESSION: Suboptimal bolus timing without central pulmonary arterial filling defect nor acute cardiopulmonary process.  At least moderate ascites, partially imaged, tracking along a small hiatal hernia, which could account for the left lung base opacity seen on prior chest radiograph. Partially imaged cirrhosis.   Electronically Signed   By: Awilda Metro   On: 03/03/2014 00:00     EKG Interpretation   Date/Time:  Sunday March 02 2014 18:02:26 EDT Ventricular Rate:  108 PR Interval:  144 QRS Duration: 96 QT Interval:  399 QTC Calculation: 535 R Axis:   76 Text Interpretation:  Sinus tachycardia Ventricular premature complex Low  voltage with right axis deviation Prolonged QT interval QT has lengthened  Since last tracing Confirmed by Effie Shy  MD, Jude Linck ([id number]) on 03/02/2014  6:13:35 PM      MDM   Final diagnoses:  Chest pain  Abdominal pain    Nonspecific chest pain with reassuring evaluation. She is at low risk for PE, and the CT scan does not indicate any large PE. I do not think that there  is any further evaluation or imaging for PE in this patient, at this time. She has chronic cirrhosis with recurrent ascites, but is not compromised by this time. She has not had  fever, or metabolic instability, or significant hypotension. Chest pain, may be related to epigastric discomfort, and GERD  Nursing Notes Reviewed/ Care Coordinated Applicable Imaging Reviewed Interpretation of Laboratory Data incorporated into ED treatment  The patient appears reasonably screened and/or stabilized for discharge and I doubt any other medical condition or other [facility] requiring further screening, evaluation, or treatment in the ED at this time prior to discharge.  Plan: Home Medications- usual plus  Maalox; Home Treatments- Rest; return here if the recommended treatment, does not improve the symptoms; Recommended follow up- PCP later today as scheduled    Flint Melter, MD 03/03/14 2114

## 2014-03-02 NOTE — ED Notes (Signed)
CT called to inquire about scan, lab called to inquire about labwork.

## 2014-03-02 NOTE — ED Notes (Addendum)
Pt's BP cuff is very positional, pt's BP fluctuates. RN & MD aware.

## 2014-03-02 NOTE — ED Notes (Signed)
Pt very tearful upon initial assessment, pt states she is so upset because she is tired being a pin cushion, explained to pt that this RN wants to help her and in order to do so, I need to put an IV in her arm and get her blood work. Pt states she does not want this but will allow me to do what I need to do, pt and daughter at bedside crying.

## 2014-03-02 NOTE — ED Notes (Signed)
Pt taken off of 02 per pt's request, pt's 02 sats reading 100% on RA

## 2014-03-02 NOTE — ED Notes (Signed)
[facility] with c/o pain that starts under left breast and radiates to right breast. Pt has some SOB and nausea as well. Stated that she normally has a paracentesis done once every 2 weeks because of liver. Stated she was supposed to go to the MD tomorrow to see if she needed fluid drawn off. Pain started approximately 2 hours ago.

## 2014-03-03 ENCOUNTER — Other Ambulatory Visit (HOSPITAL_COMMUNITY): Payer: Self-pay | Admitting: Internal Medicine

## 2014-03-03 DIAGNOSIS — K7460 Unspecified cirrhosis of liver: Secondary | ICD-10-CM

## 2014-03-03 DIAGNOSIS — R188 Other ascites: Secondary | ICD-10-CM

## 2014-03-03 NOTE — Discharge Instructions (Signed)
To using Maalox before meals, and at bedtime to help with your discomfort.   Abdominal Pain, Adult Many things can cause abdominal pain. Usually, abdominal pain is not caused by a disease and will improve without treatment. It can often be observed and treated at home. Your health care provider will do a physical exam and possibly order blood tests and X-rays to help determine the seriousness of your pain. However, in many cases, more time must pass before a clear cause of the pain can be found. Before that point, your health care provider may not know if you need more testing or further treatment. HOME CARE INSTRUCTIONS  Monitor your abdominal pain for any changes. The following actions may help to alleviate any discomfort you are experiencing:  Only take over-the-counter or prescription medicines as directed by your health care provider.  Do not take laxatives unless directed to do so by your health care provider.  Try a clear liquid diet (broth, tea, or water) as directed by your health care provider. Slowly move to a bland diet as tolerated. SEEK MEDICAL CARE IF:  You have unexplained abdominal pain.  You have abdominal pain associated with nausea or diarrhea.  You have pain when you urinate or have a bowel movement.  You experience abdominal pain that wakes you in the night.  You have abdominal pain that is worsened or improved by eating food.  You have abdominal pain that is worsened with eating fatty foods. SEEK IMMEDIATE MEDICAL CARE IF:   Your pain does not go away within 2 hours.  You have a fever.  You keep throwing up (vomiting).  Your pain is felt only in portions of the abdomen, such as the right side or the left lower portion of the abdomen.  You pass bloody or black tarry stools. MAKE SURE YOU:  Understand these instructions.   Will watch your condition.   Will get help right away if you are not doing well or get worse.  Document Released: 09/07/2005  Document Revised: 09/18/2013 Document Reviewed: 08/07/2013 [facility] Patient Information 2014 [place].  Chest Pain (Nonspecific) It is often hard to give a specific diagnosis for the cause of chest pain. There is always a chance that your pain could be related to something serious, such as a heart attack or a blood clot in the lungs. You need to follow up with your caregiver for further evaluation. CAUSES   Heartburn.  Pneumonia or bronchitis.  Anxiety or stress.  Inflammation around your heart (pericarditis) or lung (pleuritis or pleurisy).  A blood clot in the lung.  A collapsed lung (pneumothorax). It can develop suddenly on its own (spontaneous pneumothorax) or from injury (trauma) to the chest.  Shingles infection (herpes zoster virus). The chest wall is composed of bones, muscles, and cartilage. Any of these can be the source of the pain.  The bones can be bruised by injury.  The muscles or cartilage can be strained by coughing or overwork.  The cartilage can be affected by inflammation and become sore (costochondritis). DIAGNOSIS  Lab tests or other studies, such as X-rays, electrocardiography, stress testing, or cardiac imaging, may be needed to find the cause of your pain.  TREATMENT   Treatment depends on what may be causing your chest pain. Treatment may include:  Acid blockers for heartburn.  Anti-inflammatory medicine.  Pain medicine for inflammatory conditions.  Antibiotics if an infection is present.  You may be advised to change lifestyle habits. This includes stopping smoking and  avoiding alcohol, caffeine, and chocolate.  You may be advised to keep your head raised (elevated) when sleeping. This reduces the chance of acid going backward from your stomach into your esophagus.  Most of the time, nonspecific chest pain will improve within 2 to 3 days with rest and mild pain medicine. HOME CARE INSTRUCTIONS   If antibiotics were prescribed, take  your antibiotics as directed. Finish them even if you start to feel better.  For the next few days, avoid physical activities that bring on chest pain. Continue physical activities as directed.  Do not smoke.  Avoid drinking alcohol.  Only take over-the-counter or prescription medicine for pain, discomfort, or fever as directed by your caregiver.  Follow your caregiver's suggestions for further testing if your chest pain does not go away.  Keep any follow-up appointments you made. If you do not go to an appointment, you could develop lasting (chronic) problems with pain. If there is any problem keeping an appointment, you must call to reschedule. SEEK MEDICAL CARE IF:   You think you are having problems from the medicine you are taking. Read your medicine instructions carefully.  Your chest pain does not go away, even after treatment.  You develop a rash with blisters on your chest. SEEK IMMEDIATE MEDICAL CARE IF:   You have increased chest pain or pain that spreads to your arm, neck, jaw, back, or abdomen.  You develop shortness of breath, an increasing cough, or you are coughing up blood.  You have severe back or abdominal pain, feel nauseous, or vomit.  You develop severe weakness, fainting, or chills.  You have a fever. THIS IS AN EMERGENCY. Do not wait to see if the pain will go away. Get medical help at once. Call your local emergency services (911 in [place] Do not drive yourself to the hospital. MAKE SURE YOU:   Understand these instructions.  Will watch your condition.  Will get help right away if you are not doing well or get worse. Document Released: 09/07/2005 Document Revised: 02/20/2012 Document Reviewed: 07/03/2008 [facility] Patient Information 2014 [place], [place].

## 2014-03-03 NOTE — ED Notes (Signed)
[name] Babette Relicammy will bring the pt home to [facility].

## 2014-03-03 NOTE — ED Notes (Signed)
MD at bedside. 

## 2014-03-04 ENCOUNTER — Ambulatory Visit (HOSPITAL_COMMUNITY)
Admission: RE | Admit: 2014-03-04 | Discharge: 2014-03-04 | Disposition: A | Discharge status: Home / Self Care | Payer: Medicare Other | Source: Ambulatory Visit | Attending: Internal Medicine | Admitting: Internal Medicine

## 2014-03-04 DIAGNOSIS — R188 Other ascites: Secondary | ICD-10-CM | POA: Insufficient documentation

## 2014-03-04 DIAGNOSIS — K7460 Unspecified cirrhosis of liver: Secondary | ICD-10-CM | POA: Insufficient documentation

## 2014-03-04 NOTE — Procedures (Signed)
Successful US guided paracentesis from [facility].  Yielded 8.6 liters of yellow fluid.  No immediate complications.  Pt tolerated well.   Specimen was not sent for labs.  Pattricia BossMORGAN, Yarah Fuente D PA-C 03/04/2014 12:18 PM

## 2014-03-12 ENCOUNTER — Other Ambulatory Visit (HOSPITAL_COMMUNITY): Payer: Self-pay | Admitting: Internal Medicine

## 2014-03-12 DIAGNOSIS — R188 Other ascites: Secondary | ICD-10-CM

## 2014-03-14 ENCOUNTER — Ambulatory Visit (HOSPITAL_COMMUNITY)
Admission: RE | Admit: 2014-03-14 | Discharge: 2014-03-14 | Disposition: A | Discharge status: Home / Self Care | Payer: Medicare Other | Source: Ambulatory Visit | Attending: Internal Medicine | Admitting: Internal Medicine

## 2014-03-14 DIAGNOSIS — R188 Other ascites: Secondary | ICD-10-CM

## 2014-03-14 NOTE — Procedures (Signed)
Successful US guided paracentesis from left mid to lower quadrant.  Yielded 8.5 liters of yellow colored fluid.  No immediate complications.  Pt tolerated well.   Specimen was not sent for labs.  Pattricia BossMORGAN, Kabeer Hoagland D PA-C 03/14/2014 2:53 PM

## 2014-03-24 ENCOUNTER — Inpatient Hospital Stay (HOSPITAL_COMMUNITY)
Admission: AD | Admit: 2014-03-24 | Discharge: 2014-03-29 | DRG: 441 | Disposition: A | Discharge status: Nursing Home (Includes ICF) | Payer: Medicare Other | Source: Ambulatory Visit | Attending: Internal Medicine | Admitting: Internal Medicine

## 2014-03-24 DIAGNOSIS — K219 Gastro-esophageal reflux disease without esophagitis: Secondary | ICD-10-CM | POA: Diagnosis present

## 2014-03-24 DIAGNOSIS — R413 Other amnesia: Secondary | ICD-10-CM

## 2014-03-24 DIAGNOSIS — F329 Major depressive disorder, single episode, unspecified: Secondary | ICD-10-CM

## 2014-03-24 DIAGNOSIS — R627 Adult failure to thrive: Secondary | ICD-10-CM | POA: Diagnosis present

## 2014-03-24 DIAGNOSIS — M1990 Unspecified osteoarthritis, unspecified site: Secondary | ICD-10-CM

## 2014-03-24 DIAGNOSIS — R296 Repeated falls: Secondary | ICD-10-CM

## 2014-03-24 DIAGNOSIS — I10 Essential (primary) hypertension: Secondary | ICD-10-CM | POA: Diagnosis present

## 2014-03-24 DIAGNOSIS — K922 Gastrointestinal hemorrhage, unspecified: Secondary | ICD-10-CM

## 2014-03-24 DIAGNOSIS — R0789 Other chest pain: Secondary | ICD-10-CM

## 2014-03-24 DIAGNOSIS — F419 Anxiety disorder, unspecified: Secondary | ICD-10-CM | POA: Diagnosis present

## 2014-03-24 DIAGNOSIS — E46 Unspecified protein-calorie malnutrition: Secondary | ICD-10-CM

## 2014-03-24 DIAGNOSIS — Z8673 Personal history of transient ischemic attack (TIA), and cerebral infarction without residual deficits: Secondary | ICD-10-CM

## 2014-03-24 DIAGNOSIS — E43 Unspecified severe protein-calorie malnutrition: Secondary | ICD-10-CM | POA: Diagnosis present

## 2014-03-24 DIAGNOSIS — I119 Hypertensive heart disease without heart failure: Secondary | ICD-10-CM

## 2014-03-24 DIAGNOSIS — K7460 Unspecified cirrhosis of liver: Secondary | ICD-10-CM | POA: Diagnosis present

## 2014-03-24 DIAGNOSIS — Z66 Do not resuscitate: Secondary | ICD-10-CM | POA: Diagnosis present

## 2014-03-24 DIAGNOSIS — E871 Hypo-osmolality and hyponatremia: Secondary | ICD-10-CM

## 2014-03-24 DIAGNOSIS — N179 Acute kidney failure, unspecified: Secondary | ICD-10-CM | POA: Diagnosis present

## 2014-03-24 DIAGNOSIS — E875 Hyperkalemia: Secondary | ICD-10-CM | POA: Diagnosis present

## 2014-03-24 DIAGNOSIS — Z79899 Other long term (current) drug therapy: Secondary | ICD-10-CM

## 2014-03-24 DIAGNOSIS — F411 Generalized anxiety disorder: Secondary | ICD-10-CM | POA: Diagnosis present

## 2014-03-24 DIAGNOSIS — R188 Other ascites: Secondary | ICD-10-CM

## 2014-03-24 DIAGNOSIS — R7989 Other specified abnormal findings of blood chemistry: Secondary | ICD-10-CM

## 2014-03-24 DIAGNOSIS — D649 Anemia, unspecified: Secondary | ICD-10-CM

## 2014-03-24 DIAGNOSIS — E6609 Other obesity due to excess calories: Secondary | ICD-10-CM | POA: Diagnosis present

## 2014-03-24 DIAGNOSIS — F319 Bipolar disorder, unspecified: Secondary | ICD-10-CM | POA: Diagnosis present

## 2014-03-24 DIAGNOSIS — R6881 Early satiety: Secondary | ICD-10-CM | POA: Diagnosis present

## 2014-03-24 DIAGNOSIS — I639 Cerebral infarction, unspecified: Secondary | ICD-10-CM

## 2014-03-24 DIAGNOSIS — R0602 Shortness of breath: Secondary | ICD-10-CM

## 2014-03-24 DIAGNOSIS — Z6838 Body mass index (BMI) 38.0-38.9, adult: Secondary | ICD-10-CM

## 2014-03-24 DIAGNOSIS — K769 Liver disease, unspecified: Principal | ICD-10-CM | POA: Diagnosis present

## 2014-03-24 DIAGNOSIS — K449 Diaphragmatic hernia without obstruction or gangrene: Secondary | ICD-10-CM

## 2014-03-24 LAB — CBC
HEMATOCRIT: 37.9 % (ref 36.0–46.0)
HEMOGLOBIN: 13.3 g/dL (ref 12.0–15.0)
MCH: 31.2 pg (ref 26.0–34.0)
MCHC: 35.1 g/dL (ref 30.0–36.0)
MCV: 89 fL (ref 78.0–100.0)
Platelets: 174 10*3/uL (ref 150–400)
RBC: 4.26 MIL/uL (ref 3.87–5.11)
RDW: 16.3 % — AB (ref 11.5–15.5)
WBC: 10.9 10*3/uL — ABNORMAL HIGH (ref 4.0–10.5)

## 2014-03-24 LAB — COMPREHENSIVE METABOLIC PANEL
ALBUMIN: 2 g/dL — AB (ref 3.5–5.2)
ALK PHOS: 109 U/L (ref 39–117)
ALT: 76 U/L — ABNORMAL HIGH (ref 0–35)
AST: 172 U/L — AB (ref 0–37)
BUN: 87 mg/dL — ABNORMAL HIGH (ref 6–23)
CO2: 17 meq/L — AB (ref 19–32)
Calcium: 8.9 mg/dL (ref 8.4–10.5)
Chloride: 87 mEq/L — ABNORMAL LOW (ref 96–112)
Creatinine, Ser: 2.09 mg/dL — ABNORMAL HIGH (ref 0.50–1.10)
GFR calc Af Amer: 27 mL/min — ABNORMAL LOW (ref >90)
GFR, EST NON AFRICAN AMERICAN: 23 mL/min — AB (ref >90)
Glucose, Bld: 114 mg/dL — ABNORMAL HIGH (ref 70–99)
POTASSIUM: 6.4 meq/L — AB (ref 3.7–5.3)
Sodium: 120 mEq/L — CL (ref 137–147)
Total Bilirubin: 2.2 mg/dL — ABNORMAL HIGH (ref 0.3–1.2)
Total Protein: 5.9 g/dL — ABNORMAL LOW (ref 6.0–8.3)

## 2014-03-24 LAB — PHOSPHORUS: Phosphorus: 4.2 mg/dL (ref 2.3–4.6)

## 2014-03-24 LAB — MAGNESIUM: Magnesium: 2.5 mg/dL (ref 1.5–2.5)

## 2014-03-24 LAB — PROTIME-INR
INR: 1.44 (ref 0.00–1.49)
Prothrombin Time: 17.2 seconds — ABNORMAL HIGH (ref 11.6–15.2)

## 2014-03-24 LAB — AMMONIA: AMMONIA: 64 umol/L — AB (ref 11–60)

## 2014-03-24 MED ORDER — LACTULOSE 10 GM/15ML PO SOLN
20.0000 g | ORAL | Status: DC
  Administered 2014-03-26 – 2014-03-28 (×2): 20 g via ORAL
  Filled 2014-03-24 (×3): qty 30

## 2014-03-24 MED ORDER — SODIUM CHLORIDE 0.9 % IJ SOLN: 3.0000 mL | INTRAMUSCULAR | Status: DC | PRN

## 2014-03-24 MED ORDER — PANTOPRAZOLE SODIUM 40 MG PO TBEC
40.0000 mg | DELAYED_RELEASE_TABLET | Freq: Two times a day (BID) | ORAL | Status: DC
  Administered 2014-03-24 – 2014-03-29 (×10): 40 mg via ORAL
  Filled 2014-03-24 (×10): qty 1

## 2014-03-24 MED ORDER — DOXEPIN HCL 50 MG PO CAPS
50.0000 mg | ORAL_CAPSULE | Freq: Every day | ORAL | Status: DC
  Administered 2014-03-24 – 2014-03-28 (×5): 50 mg via ORAL
  Filled 2014-03-24 (×6): qty 1

## 2014-03-24 MED ORDER — ALBUTEROL SULFATE (2.5 MG/3ML) 0.083% IN NEBU: 2.5000 mg | INHALATION_SOLUTION | Freq: Four times a day (QID) | RESPIRATORY_TRACT | Status: DC | PRN

## 2014-03-24 MED ORDER — METHOCARBAMOL 500 MG PO TABS
500.0000 mg | ORAL_TABLET | Freq: Three times a day (TID) | ORAL | Status: DC | PRN
  Administered 2014-03-25 – 2014-03-27 (×3): 500 mg via ORAL
  Filled 2014-03-24 (×3): qty 1

## 2014-03-24 MED ORDER — PRO-STAT SUGAR FREE PO LIQD
30.0000 mL | Freq: Every day | ORAL | Status: DC
  Administered 2014-03-24 – 2014-03-29 (×6): 30 mL via ORAL
  Filled 2014-03-24 (×5): qty 30

## 2014-03-24 MED ORDER — SODIUM CHLORIDE 0.9 % IJ SOLN
3.0000 mL | Freq: Two times a day (BID) | INTRAMUSCULAR | Status: DC
  Administered 2014-03-24: 3 mL via INTRAVENOUS

## 2014-03-24 MED ORDER — SODIUM POLYSTYRENE SULFONATE 15 GM/60ML PO SUSP
45.0000 g | Freq: Once | ORAL | Status: AC
  Administered 2014-03-24: 45 g via ORAL
  Filled 2014-03-24: qty 180

## 2014-03-24 MED ORDER — SODIUM CHLORIDE 0.9 % IV SOLN: 250.0000 mL | INTRAVENOUS | Status: DC | PRN

## 2014-03-24 MED ORDER — BUSPIRONE HCL 5 MG PO TABS
5.0000 mg | ORAL_TABLET | Freq: Every day | ORAL | Status: DC
  Administered 2014-03-24 – 2014-03-28 (×5): 5 mg via ORAL
  Filled 2014-03-24 (×6): qty 1

## 2014-03-24 MED ORDER — ACETAMINOPHEN 650 MG RE SUPP: 650.0000 mg | Freq: Four times a day (QID) | RECTAL | Status: DC | PRN

## 2014-03-24 MED ORDER — PRO-STAT SUGAR FREE PO LIQD: 30.0000 mL | ORAL | Status: DC

## 2014-03-24 MED ORDER — ACETAMINOPHEN 325 MG PO TABS: 325.0000 mg | ORAL_TABLET | Freq: Four times a day (QID) | ORAL | Status: DC | PRN

## 2014-03-24 MED ORDER — ADULT MULTIVITAMIN W/MINERALS CH
1.0000 | ORAL_TABLET | Freq: Every day | ORAL | Status: DC
  Administered 2014-03-25 – 2014-03-29 (×6): 1 via ORAL
  Filled 2014-03-24 (×5): qty 1

## 2014-03-24 MED ORDER — FUROSEMIDE 40 MG PO TABS
40.0000 mg | ORAL_TABLET | ORAL | Status: DC
  Filled 2014-03-24: qty 1

## 2014-03-24 MED ORDER — CERTAVITE/ANTIOXIDANTS PO TABS: 1.0000 | ORAL_TABLET | Freq: Every day | ORAL | Status: DC

## 2014-03-24 MED ORDER — METOCLOPRAMIDE HCL 5 MG PO TABS
5.0000 mg | ORAL_TABLET | Freq: Every day | ORAL | Status: DC
  Administered 2014-03-25 – 2014-03-29 (×5): 5 mg via ORAL
  Filled 2014-03-24 (×6): qty 1

## 2014-03-24 MED ORDER — FUROSEMIDE 40 MG PO TABS
60.0000 mg | ORAL_TABLET | ORAL | Status: DC
  Filled 2014-03-24: qty 1

## 2014-03-24 MED ORDER — DOCUSATE SODIUM 100 MG PO CAPS: 100.0000 mg | ORAL_CAPSULE | Freq: Every day | ORAL | Status: DC | PRN

## 2014-03-24 MED ORDER — ONDANSETRON HCL 4 MG PO TABS
4.0000 mg | ORAL_TABLET | Freq: Three times a day (TID) | ORAL | Status: DC | PRN
  Administered 2014-03-25 – 2014-03-28 (×4): 4 mg via ORAL
  Filled 2014-03-24 (×4): qty 1

## 2014-03-24 MED ORDER — SODIUM CHLORIDE 0.9 % IJ SOLN
3.0000 mL | Freq: Two times a day (BID) | INTRAMUSCULAR | Status: DC
  Administered 2014-03-25 – 2014-03-29 (×6): 3 mL via INTRAVENOUS

## 2014-03-24 MED ORDER — FUROSEMIDE 40 MG PO TABS: 40.0000 mg | ORAL_TABLET | ORAL | Status: DC

## 2014-03-24 MED ORDER — OCUVITE-LUTEIN PO CAPS
1.0000 | ORAL_CAPSULE | Freq: Every day | ORAL | Status: DC
  Administered 2014-03-26 – 2014-03-29 (×4): 1 via ORAL
  Filled 2014-03-24 (×5): qty 1

## 2014-03-24 MED ORDER — PRO-STAT SUGAR FREE PO LIQD
30.0000 mL | Freq: Every day | ORAL | Status: DC | PRN
  Filled 2014-03-24: qty 30

## 2014-03-24 MED ORDER — NITROGLYCERIN 0.4 MG SL SUBL: 0.4000 mg | SUBLINGUAL_TABLET | SUBLINGUAL | Status: DC | PRN

## 2014-03-24 MED ORDER — SODIUM CHLORIDE 0.9 % IV SOLN
INTRAVENOUS | Status: DC
  Administered 2014-03-24 – 2014-03-25 (×2): via INTRAVENOUS

## 2014-03-24 MED ORDER — MELATONIN 3 MG PO CAPS: 3.0000 mg | ORAL_CAPSULE | Freq: Every day | ORAL | Status: DC

## 2014-03-24 NOTE — H&P (Signed)
[name] is an 68 y.o. female.   PCP:   Gwen Pounds, MD   Chief Complaint:  Ascites/Hyponatremia/Azotemia.  HPI: 69 F c cirrhosis and ascites. Getting worse. mentally worse. issues lately c Na, K and Cr.  Had red patchy rash and is better post flexeril. The flexeril helped her not cramp.  Will use a different muscle relaxor for next time.  pt denies itching/skin discomfort noticed rash after she took first flexeril for procedure.   On 4/3rd we took off 8.5L  She has had breathing Rx and needs Paracentesis again  She clearly is doing poorly and we will admit for eval and Rx.     Past Medical History:  Past Medical History  Diagnosis Date  . Exogenous obesity   . Hypertension   . Anxiety and depression     chronic  . Hiatal hernia   . Atypical chest pain 06/10/03    normal 2 day adenosine cardiolite  . Memory changes   . Bipolar disorder   . Ascites   . Cirrhosis   . Anginal pain   . Exertional shortness of breath   . Shortness of breath     "all the time right now" (09/19/2013)  . Anemia   . History of blood transfusion 05/2013    "once; related to a stomach ulcer" (09/19/2013)  . Bleeding stomach ulcer 04/22/2013  . GERD (gastroesophageal reflux disease)   . Osteoarthritis   . Arthritis     "all over my body" (09/19/2013)  . Anxiety   . Depression   . Fatty liver disease, nonalcoholic   . CVA (cerebral infarction) 04/21/13    CCT c/w Left cerebellar infarct which appears subacute to remote    NASH Cirrhosis/Ascites/Liver Failure.  Obesity, HTN, Fatty Liver based on Elevated LFTs and US findings 4/07,  Osteoarthritis - Dr Collins/Dr Aluisio, IBS,  hemorrhoids, GERD/Large hiatal hernia w/gastritis - Last EGD 03/05/10, Gastroparesis  Dr Madilyn Fireman, allergic rhinitis.  Chronic fatigueFibromyalgia/ H/O PMR per Dr Corliss Skains.  Depression/Bipolar.  Insomnia.  Disabled.  Lactose intol CVA Subacute seen on 04/2013 CCT    Past Surgical History  Procedure  Laterality Date  . Tubal ligation  1977  . Cardiac catheterization  1990    normal cath  . Esophagogastroduodenoscopy N/A 04/22/2013    Procedure: ESOPHAGOGASTRODUODENOSCOPY (EGD);  Surgeon: Shirley Friar, MD;  Location: [facility] ENDOSCOPY;  Service: Endoscopy;  Laterality: N/A;  . Paracentesis  09/13/2013    "just once" (09/19/2013)  . Carpal tunnel release Bilateral 1990's  . Knee arthroscopy w/ acl reconstruction Bilateral ?1990's  . Vaginal hysterectomy  1980  . Salpingoophorectomy Bilateral 1990's   Right carpel tunnel release (2007), total abdominal hysterectomy (1979), tubal ligation (1977), ACL repairs (1992), R & L Knee reconstruction 1996   Allergies:   Allergies  Allergen Reactions  . Codeine Itching  . Sulfa Antibiotics Other (See Comments)    Pull my hair out     Medications: Prior to Admission medications   Medication Sig Start Date End Date Taking? Authorizing Provider  acetaminophen (TYLENOL) 325 MG tablet Take 325 mg by mouth See admin instructions. Take 1 tablet (325 mg) by mouth 2 times daily scheduled.  May also take an additional 2 tablets every 6 hours as needed for pain    Historical Provider, MD  albuterol (PROVENTIL) (2.5 MG/3ML) 0.083% nebulizer solution Take 2.5 mg by nebulization 4 (four) times daily as needed for wheezing.    Historical Provider, MD  Amino Acids-Protein Hydrolys (FEEDING  SUPPLEMENT, PRO-STAT SUGAR FREE 64,) LIQD Take 30 mLs by mouth See admin instructions. Take 30 mls by mouth every day after breakfast and offer a dose after dinner    Historical Provider, MD  aspirin EC 81 MG tablet Take 81 mg by mouth daily.    Historical Provider, MD  busPIRone (BUSPAR) 5 MG tablet Take 5 mg by mouth at bedtime.    Historical Provider, MD  docusate sodium (COLACE) 100 MG capsule Take 100 mg by mouth daily as needed (constipation).     Historical Provider, MD  doxepin (SINEQUAN) 75 MG capsule Take 75 mg by mouth at bedtime.     Historical Provider, MD   FLUoxetine (PROZAC) 40 MG capsule Take 40 mg by mouth 2 (two) times daily. Per Dr.Pulis    Historical Provider, MD  furosemide (LASIX) 20 MG tablet Take 40-60 mg by mouth See admin instructions. Take 2 tablets (40 mg) every other day in the morning alternating with 3 tablets ( 60 mg) on other days    Historical Provider, MD  lactulose, encephalopathy, (ENULOSE) 10 GM/15ML SOLN Take 20 g by mouth 3 (three) times a week. Every Tuesday, Thursday and Saturday (watch for diarrhea)    Historical Provider, MD  Melatonin 3 MG CAPS Take 3 mg by mouth at bedtime.     Historical Provider, MD  metoCLOPramide (REGLAN) 5 MG tablet Take 5 mg by mouth daily with breakfast.    Historical Provider, MD  Multiple Vitamins-Minerals (CERTAVITE/ANTIOXIDANTS) TABS Take 1 tablet by mouth daily. Take on an empty stomach (10am)    Historical Provider, MD  nitroGLYCERIN (NITROSTAT) 0.4 MG SL tablet Place 0.4 mg under the tongue every 5 (five) minutes as needed for chest pain.    Historical Provider, MD  ondansetron (ZOFRAN) 4 MG tablet Take 4 mg by mouth every 8 (eight) hours as needed for nausea or vomiting.     Historical Provider, MD  pantoprazole (PROTONIX) 40 MG tablet Take 40 mg by mouth 2 (two) times daily.    Historical Provider, MD  polyethylene glycol (MIRALAX / GLYCOLAX) packet Take 17 g by mouth daily as needed (constipation). Mix with 8 oz of fluid and drink    Historical Provider, MD  spironolactone (ALDACTONE) 50 MG tablet Take 100 mg by mouth daily.     Historical Provider, MD   Complete Medication List: 1)  Pro-stat Oral Liqd (Amino acids-protein hydrolys) .Marland Kitchen.Marland Kitchen.Marland Kitchen. 150-100-30 30mins ad pc bkfst 2)  Enulose 10 Gm/1115ml Oral Soln (Lactulose encephalopathy) .... Uad 3xw 3)  Buspirone Hcl 5 Mg Tabs (Buspirone hcl) .Marland Kitchen... 1 po 8 pm 4)  Proventil Hfa 108 (90 Base) Mcg/act Aers (Albuterol sulfate) .... 2.5mg  qid  prn wheezing 5)  Tylenol 325 Mg Tabs (Acetaminophen) .Marland Kitchen... 1-2 tab bid prn pain/fever/if she asks. get rid of  the standing 1 bid dosing 6)  Zofran 4 Mg Tabs (Ondansetron hcl) .... One po q 8 hrs prn nausea/vomiting 7)  Certavite/antioxidants Tabs (Multiple vitamins-minerals) .Marland Kitchen... 1 qd 8)  Colace 100 Mg Caps (Docusate sodium) .... One po daily prn constipation 9)  Nitrostat 0.4 Mg Subl (Nitroglycerin) .... Take 1 tab sublinguly q 5mins as needed for chest pain 10)  Furosemide 40 Mg Tabs (Furosemide) .Marland Kitchen... 1 tab = 40 mg every day 11)  Reglan 5 Mg Tabs (Metoclopramide hcl) .... One po c breakfast and lunch ie 2 per day 12)  Albuterol Sulfate (2.5 Mg/333ml) 0.083% Nebu (Albuterol sulfate) .... Q 8 hours prn 13)  Miralax Powd (Polyethylene glycol 3350) .Marland Kitchen.Marland Kitchen..Marland Kitchen  17gm qd prn constipation 14)  Melatonin 3 Mg Tabs (Melatonin) .Marland Kitchen... 1 po qhs 15)  Pantoprazole Sodium 40 Mg Tbec (Pantoprazole sodium) .Marland Kitchen... 1 po bid 16)  Aspirin 81 Mg Ec Tab (Aspirin) .... Take one (1) tablet by mouth daily 17)  Prozac 40 Mg Caps (Fluoxetine hcl) .... Take (1) tablet by mouth every day 18)  Doxepin Hcl 50 Mg Oral Caps (Doxepin hcl) .Marland Kitchen... 1 qhs 19)  Prenatal Formula Tabs (Prenatal vit-fe fumarate-fa tabs) .Marland Kitchen... 1 po qd   No prescriptions prior to admission     Social History:  reports that she has never smoked. She has never used smokeless tobacco. She reports that she does not drink alcohol or use illicit drugs.  Elnita MaxwellCheryl is widowed with 3 children 4 children.  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  Disability.  Family History: Family History  Problem Relation Age of Onset  . Heart attack Mother   . Heart disease Mother   . Heart disease Father   . Heart attack Father     Father and mother are both deceased.  Significant for anxiety, osteoarthritis, CAD, hypertension, alzheimers, myocardial infarction, allergies, COPD.  She has a sister with hypertension.  Review of Systems:  Review of Systems - Review of Systems  General:       Complains of fatigue.        Denies  fevers, chills, headache, sweats, anorexia, malaise, weight loss.   Cardiovascular:       Complains of dyspnea on exertion, orthopnea, peripheral edema.        Denies chest pains, claudication, palpitations, syncope, PND.   Respiratory:       Denies cough, dyspnea, excessive sputum, hemoptysis, wheezing.   Gastrointestinal:       Complains of nausea, vomiting, diarrhea.   Musculoskeletal:       Complains of stiffness, arthritis.   Skin:       Complains of rash.        Denies itching, dryness, suspicious lesions.        rash is better Psychiatric:       Complains of memory loss.        confusion  PE  Entered weight:  250  lbs., Calculated Weight: 250 lbs., ( 113.40 kg) Height: 66 in., ( 167.64 cm) Pulse rate: 60 Pulse rhythm: regular  Blood Pressure #1: 120 / 78 mm Hg    General appearance: in wheelchair and she is SOB.  Ascites is back  Eyes  External: conjunctivae and lids normal Pupils: equal, round, reactive to light and accommodation  Ears, Nose and Throat  External ears: normal, no lesions or deformities External nose: normal, no lesions or deformities Otoscopic: canals clear, tympanic membranes intact, no fluid Hearing: grossly intact Nasal: mucosa, septum, and turbinates normal Dental: poor dentition Pharynx: tongue normal, protrudes mid line,  posterior pharynx without erythema or exudate  Neck  Neck: supple, no masses, trachea midline Thyroid: no nodules, masses, tenderness, or enlargement  Respiratory  Respiratory effort: no intercostal retractions or use of accessory muscles Auscultation: no rales, rhonchi, or wheezes  Cardiovascular  Palpation: no thrill or palpable murmurs, no displacement of PMI Auscultation: S1, S2, no murmur, rub, or gallop Carotid arteries: pulses 2+, symmetric, no bruits Periph. circulation: 2 + E.  Some venous stasis changes.  Dry flaking scaley legs  Gastrointestinal  Abdomen: obese. Ascites.  Liver and spleen: no  enlargement or nodularity  Lymphatic  Neck: no  cervical adenopathy  Musculoskeletal  Gait and station: in wheelchair Digits and nails: no clubbing, cyanosis, petechiae, or nodes RUE: normal ROM and strength, no joint enlargement or tenderness LUE: normal ROM and strength, no joint enlargement or tenderness RLE: normal ROM and strength, no joint enlargement or tenderness LLE: normal ROM and strength, no joint enlargement or tenderness  Skin  Inspection: No clear rash on arm  Neurologic  Cranial nerves: II - XII grossly intact Reflexes: 1+, symmetric      Labs on Admission:  No results found for this basename: NA, K, CL, CO2, GLUCOSE, BUN, CREATININE, CALCIUM, MG, PHOS,  in the last 72 hours No results found for this basename: AST, ALT, ALKPHOS, BILITOT, PROT, ALBUMIN,  in the last 72 hours No results found for this basename: LIPASE, AMYLASE,  in the last 72 hours No results found for this basename: WBC, NEUTROABS, HGB, HCT, MCV, PLT,  in the last 72 hours No results found for this basename: CKTOTAL, CKMB, CKMBINDEX, TROPONINI,  in the last 72 hours Lab Results  Component Value Date   INR 1.42 03/02/2014   INR 1.38 02/14/2014   INR 1.45 09/20/2013     LAB RESULT POCT:  Results for orders placed during the hospital encounter of 03/02/14  CBC WITH DIFFERENTIAL      Result Value Ref Range   WBC 7.4  4.0 - 10.5 K/uL   RBC 3.98  3.87 - 5.11 MIL/uL   Hemoglobin 12.4  12.0 - 15.0 g/dL   HCT 57.8  46.9 - 62.9 %   MCV 90.7  78.0 - 100.0 fL   MCH 31.2  26.0 - 34.0 pg   MCHC 34.3  30.0 - 36.0 g/dL   RDW 52.8 (*) 41.3 - 24.4 %   Platelets 114 (*) 150 - 400 K/uL   Neutrophils Relative % 79 (*) 43 - 77 %   Neutro Abs 5.8  1.7 - 7.7 K/uL   Lymphocytes Relative 11 (*) 12 - 46 %   Lymphs Abs 0.8  0.7 - 4.0 K/uL   Monocytes Relative 10  3 - 12 %   Monocytes Absolute 0.7  0.1 - 1.0 K/uL   Eosinophils Relative 1  0 - 5 %   Eosinophils Absolute 0.1  0.0 - 0.7 K/uL   Basophils Relative  0  0 - 1 %   Basophils Absolute 0.0  0.0 - 0.1 K/uL  COMPREHENSIVE METABOLIC PANEL      Result Value Ref Range   Sodium 132 (*) 137 - 147 mEq/L   Potassium 4.9  3.7 - 5.3 mEq/L   Chloride 95 (*) 96 - 112 mEq/L   CO2 18 (*) 19 - 32 mEq/L   Glucose, Bld 95  70 - 99 mg/dL   BUN 42 (*) 6 - 23 mg/dL   Creatinine, Ser 0.10 (*) 0.50 - 1.10 mg/dL   Calcium 9.0  8.4 - 27.2 mg/dL   Total Protein 6.1  6.0 - 8.3 g/dL   Albumin 2.2 (*) 3.5 - 5.2 g/dL   AST 84 (*) 0 - 37 U/L   ALT 35  0 - 35 U/L   Alkaline Phosphatase 88  39 - 117 U/L   Total Bilirubin 1.9 (*) 0.3 - 1.2 mg/dL   GFR calc non Af Amer 39 (*) >90 mL/min   GFR calc Af Amer 45 (*) >90 mL/min  LIPASE, BLOOD      Result Value Ref Range   Lipase 41  11 - 59 U/L  PROTIME-INR      Result Value Ref Range   Prothrombin Time 17.0 (*) 11.6 - 15.2 seconds   INR 1.42  0.00 - 1.49  TROPONIN I      Result Value Ref Range   Troponin I <0.30  <0.30 ng/mL      Radiological Exams on Admission: No results found.    Orders placed during the hospital encounter of 03/02/14  . EKG 12-LEAD  . EKG 12-LEAD  . EKG     Assessment/Plan Active Problems:   * No active hospital problems. *  Impression & Recommendations:  Problem # 1:  Ascites (ICD-789.59) (ICD10-R18.8) Had red patch rash and is better. It happened post flexeril and may have neen caused by it. The flexeril helped her not cramp.  Will use a different muscle relaxor for next time.  pt denies itching/skin discomfort noticed rash after she took first flexeril for procedure.   On 4/3rd we took off 8.5L  She has had breathing Rx and needs Paracentesis again 257 - 246 - 250 #s.  Holding on Indwelling Pleur X catheter but may need to consider if we make her hospice palliative care.  I changed around the lasix dosing and the Aldactone. K is better   Problem # 2:  Hyponatremia (ICD-276.1) (ICD10-E87.1) Last 2 NA's 123-125.   Problem # 3:  Azotemia (ICD-790.6)  (ICD10-R79.89) Cr has gone from 1.1 - 1.6 - 1.9 May be developing Hepatorenal. May be dying. DNR confirmed. May be becoming a hospice pt. I changed around the lasix dosing and the Aldactone as outpt. K is better Re-check labs.  Problem # 4:  Nausea (ICD-787.02) (ICD10-R11.0)  Her updated medication list for this problem includes:    Zofran 4 Mg Tabs (Ondansetron hcl) ..... One po q 8 hrs prn nausea/vomiting  N/V/Dry Heaving/Anorexia. Getting worse Zofran/Reglan Eating makes her nauseated. Pushing protein as able Continue protonix.   Problem # 5:  Adult failure to thrive (ICD-783.7) (MVH84-O96.2)  DNR. May be aproaching Hospice Palliative care.  Problem # 6:  Morbid obesity (ICD-278.01) (ICD10-E66.01) BMI:  40.35 (03/24/2014 4:22:01 PM), weight: 250 (03/24/2014 4:22:01 PM), height: 66 (03/24/2014 4:22:01 PM) weight: 253 (01/01/2014 4:24:55 PM) weight: 253.25 (11/11/2013 4:36:25 PM)  She is barely eating. Her wt is mostly fluid.  Problem # 7:  HYPERTENSION (ICD-401.9) (ICD10-I10)  The following medications were removed from the medication list:    Aldactone 100 Mg Tabs (Spironolactone) .Marland Kitchen... 2 po qd  Her updated medication list for this problem includes:    Furosemide 40 Mg Tabs (Furosemide) .Marland Kitchen... 1 tab = 40 mg every day  she is off the corgard and off the aldactone and on less lasix.  BP today: 120/78 Prior BP: 100/60 (03/11/2014)  Labs Reviewed: Creat: 1.9 (03/17/2014)     Comments: Admit.  get labs.  Paracentesis. Assess where we are and weather we can provide any more QOL.  Gwen Pounds 03/24/2014, 5:40 PM

## 2014-03-24 NOTE — Progress Notes (Signed)
PHARMACIST - PHYSICIAN ORDER COMMUNICATION  CONCERNING: P[employer]T Medication Policy on Herbal Medications  DESCRIPTION:  This patient's order for:  melatonin  has been noted.  This product(s) is classified as an "herbal" or natural product. Due to a lack of definitive safety studies or FDA approval, nonstandard manufacturing practices, plus the potential risk of unknown drug-drug interactions while on inpatient medications, the Pharmacy and Therapeutics Committee does not permit the use of "herbal" or natural products of this type within [place].   ACTION TAKEN: The pharmacy department is unable to verify this order at this time and your patient has been informed of this safety policy. Please reevaluate patient's clinical condition at discharge and address if the herbal or natural product(s) should be resumed at that time.  Berlin Viereck, PharmD, BCPS Clinical Pharmacist Pager [phone]  

## 2014-03-25 ENCOUNTER — Inpatient Hospital Stay (HOSPITAL_COMMUNITY): Payer: Medicare Other

## 2014-03-25 ENCOUNTER — Encounter (HOSPITAL_COMMUNITY): Payer: Self-pay | Admitting: *Deleted

## 2014-03-25 DIAGNOSIS — E875 Hyperkalemia: Secondary | ICD-10-CM

## 2014-03-25 DIAGNOSIS — E871 Hypo-osmolality and hyponatremia: Secondary | ICD-10-CM

## 2014-03-25 DIAGNOSIS — E46 Unspecified protein-calorie malnutrition: Secondary | ICD-10-CM

## 2014-03-25 LAB — BASIC METABOLIC PANEL
BUN: 87 mg/dL — ABNORMAL HIGH (ref 6–23)
CALCIUM: 8.5 mg/dL (ref 8.4–10.5)
CHLORIDE: 89 meq/L — AB (ref 96–112)
CO2: 16 meq/L — AB (ref 19–32)
Creatinine, Ser: 2.16 mg/dL — ABNORMAL HIGH (ref 0.50–1.10)
GFR calc Af Amer: 26 mL/min — ABNORMAL LOW (ref >90)
GFR calc non Af Amer: 22 mL/min — ABNORMAL LOW (ref >90)
Glucose, Bld: 91 mg/dL (ref 70–99)
POTASSIUM: 5.3 meq/L (ref 3.7–5.3)
SODIUM: 123 meq/L — AB (ref 137–147)

## 2014-03-25 LAB — COMPREHENSIVE METABOLIC PANEL
ALT: 74 U/L — ABNORMAL HIGH (ref 0–35)
AST: 164 U/L — ABNORMAL HIGH (ref 0–37)
Albumin: 1.9 g/dL — ABNORMAL LOW (ref 3.5–5.2)
Alkaline Phosphatase: 107 U/L (ref 39–117)
BILIRUBIN TOTAL: 1.9 mg/dL — AB (ref 0.3–1.2)
BUN: 87 mg/dL — AB (ref 6–23)
CO2: 15 meq/L — AB (ref 19–32)
CREATININE: 2.08 mg/dL — AB (ref 0.50–1.10)
Calcium: 8.6 mg/dL (ref 8.4–10.5)
Chloride: 90 mEq/L — ABNORMAL LOW (ref 96–112)
GFR, EST AFRICAN AMERICAN: 27 mL/min — AB (ref >90)
GFR, EST NON AFRICAN AMERICAN: 24 mL/min — AB (ref >90)
Glucose, Bld: 94 mg/dL (ref 70–99)
Potassium: 6.2 mEq/L — ABNORMAL HIGH (ref 3.7–5.3)
Sodium: 122 mEq/L — ABNORMAL LOW (ref 137–147)
Total Protein: 5.5 g/dL — ABNORMAL LOW (ref 6.0–8.3)

## 2014-03-25 LAB — CBC
HCT: 37 % (ref 36.0–46.0)
Hemoglobin: 13.1 g/dL (ref 12.0–15.0)
MCH: 31.5 pg (ref 26.0–34.0)
MCHC: 35.4 g/dL (ref 30.0–36.0)
MCV: 88.9 fL (ref 78.0–100.0)
PLATELETS: 155 10*3/uL (ref 150–400)
RBC: 4.16 MIL/uL (ref 3.87–5.11)
RDW: 16.4 % — ABNORMAL HIGH (ref 11.5–15.5)
WBC: 12.1 10*3/uL — AB (ref 4.0–10.5)

## 2014-03-25 MED ORDER — ALBUMIN HUMAN 25 % IV SOLN
50.0000 g | Freq: Once | INTRAVENOUS | Status: AC
  Administered 2014-03-25: 50 g via INTRAVENOUS
  Filled 2014-03-25: qty 200

## 2014-03-25 MED ORDER — FUROSEMIDE 40 MG PO TABS
40.0000 mg | ORAL_TABLET | Freq: Every day | ORAL | Status: DC
  Administered 2014-03-25: 40 mg via ORAL
  Filled 2014-03-25: qty 1

## 2014-03-25 MED ORDER — BOOST / RESOURCE BREEZE PO LIQD
1.0000 | Freq: Three times a day (TID) | ORAL | Status: DC
  Administered 2014-03-26 – 2014-03-29 (×9): 1 via ORAL

## 2014-03-25 MED ORDER — BIOTENE DRY MOUTH MT LIQD
15.0000 mL | Freq: Two times a day (BID) | OROMUCOSAL | Status: DC
  Administered 2014-03-26 – 2014-03-29 (×6): 15 mL via OROMUCOSAL

## 2014-03-25 NOTE — Consult Note (Signed)
Referring Provider: Dr. Timothy Lassousso  Primary Care Physician:  [name],Elizabeth M, MD Primary Gastroenterologist:  Dr. Dorena CookeyJohn Jacobson  Reason for Consultation:  Progressive liver failure, refractory ascites  HPI: [name] is a 68 y.o. female with [name] admitted to the hospital yesterday by her primary physician because of refractory ascites.   She is followed by my partner, Dr. Dorena CookeyJohn Jacobson, who last saw her about a month ago.   Recently, she has been requiring paracentesis every week or so, typically 8 L at a time, to try to manage fluid. She has been on furosemide as an outpatient, but it was noted on this admission that there was a moderate rise in her creatinine level, over the past 3 weeks. On March 23, it was 1.38, but on admission yesterday, it was 2.09.  The patient is on lactulose as an outpatient but has not had significant problems with encephalopathy, nor GI bleeding. Her problem has primarily been refractory ascites, requiring increasingly frequent paracentesis treatments. Some discussion was being given to placing an indwelling peritoneal catheter for prn ascitic fluid drainage, but it was not clear that the patient was sufficiently and stage to justify that intervention.   Today, the patient had approximately 8 L of ascitic fluid removed, 11 days following her most recent previous paracentesis. This has been associated with overall improvement in symptoms, in terms of abdominal comfort, food intolerance, and breathing. Basically, the patient states that she feels well lying in bed at this moment.     Past Medical History  Diagnosis Date  . Exogenous obesity   . Hypertension   . Anxiety and depression     chronic  . Hiatal hernia   . Atypical chest pain 06/10/03    normal 2 day adenosine cardiolite  . Memory changes   . Bipolar disorder   . Ascites   . Jacobson   . Anginal pain   . Exertional shortness of breath   . Shortness of breath     "all the time right now"  (09/19/2013)  . Anemia   . History of blood transfusion 05/2013    "once; related to a stomach ulcer" (09/19/2013)  . Bleeding stomach ulcer 04/22/2013  . GERD (gastroesophageal reflux disease)   . Osteoarthritis   . Arthritis     "all over my body" (09/19/2013)  . Anxiety   . Depression   . Fatty liver disease, nonalcoholic   . CVA (cerebral infarction) 04/21/13    CCT c/w Left cerebellar infarct which appears subacute to remote    Past Surgical History  Procedure Laterality Date  . Tubal ligation  1977  . Cardiac catheterization  1990    normal cath  . Esophagogastroduodenoscopy N/A 04/22/2013    Procedure: ESOPHAGOGASTRODUODENOSCOPY (EGD);  Surgeon: Shirley FriarVincent C. Schooler, MD;  [facility];  Service: Endoscopy;  Laterality: N/A;  . Paracentesis  09/13/2013    "just once" (09/19/2013)  . Carpal tunnel release Bilateral 1990's  . Knee arthroscopy w/ acl reconstruction Bilateral ?1990's  . Vaginal hysterectomy  1980  . Salpingoophorectomy Bilateral 1990's    Prior to Admission medications   Medication Sig Start Date End Date Taking? Authorizing Provider  acetaminophen (TYLENOL) 325 MG tablet Take 325 mg by mouth See admin instructions. Take 1 tablet (325 mg) by mouth 2 times daily scheduled.  May also take an additional 2 tablets every 6 hours as needed for pain   Yes Historical Provider, MD  Amino Acids-Protein Hydrolys (FEEDING SUPPLEMENT, PRO-STAT SUGAR FREE 64,)  LIQD Take 30 mLs by mouth See admin instructions. Take 30 mls by mouth every day after breakfast and offer a dose after dinner   Yes Historical Provider, MD  busPIRone (BUSPAR) 5 MG tablet Take 5 mg by mouth at bedtime.   Yes Historical Provider, MD  docusate sodium (COLACE) 100 MG capsule Take 100 mg by mouth daily as needed (constipation).    Yes Historical Provider, MD  doxepin (SINEQUAN) 50 MG capsule Take 50 mg by mouth at bedtime.   Yes Historical Provider, MD  furosemide (LASIX) 40 MG tablet Take 40 mg by mouth  daily.   Yes Historical Provider, MD  Melatonin 3 MG TABS Take 3 mg by mouth at bedtime.   Yes Historical Provider, MD  metoCLOPramide (REGLAN) 5 MG tablet Take 5 mg by mouth 2 (two) times daily with breakfast and lunch.    Yes Historical Provider, MD  Multiple Vitamins-Minerals (CERTAVITE/ANTIOXIDANTS) TABS Take 1 tablet by mouth daily. Take on an empty stomach (10am)   Yes Historical Provider, MD  ondansetron (ZOFRAN) 4 MG tablet Take 4 mg by mouth every 6 (six) hours as needed for nausea or vomiting.    Yes Historical Provider, MD  pantoprazole (PROTONIX) 40 MG tablet Take 40 mg by mouth 2 (two) times daily.   Yes Historical Provider, MD  polyethylene glycol (MIRALAX / GLYCOLAX) packet Take 17 g by mouth daily as needed (constipation). Mix with 8 oz of fluid and drink   Yes Historical Provider, MD  albuterol (PROVENTIL) (2.5 MG/3ML) 0.083% nebulizer solution Take 2.5 mg by nebulization 4 (four) times daily as needed for wheezing.    Historical Provider, MD  lactulose, encephalopathy, (ENULOSE) 10 GM/15ML SOLN Take 20 g by mouth 3 (three) times a week. Every Tuesday, Thursday and Saturday (watch for diarrhea)    Historical Provider, MD  nitroGLYCERIN (NITROSTAT) 0.4 MG SL tablet Place 0.4 mg under the tongue every 5 (five) minutes as needed for chest pain.    Historical Provider, MD    Current Facility-Administered Medications  Medication Dose Route Frequency Provider Last Rate Last Dose  . 0.9 %  sodium chloride infusion   Intravenous Continuous Jarome Matin, MD 50 mL/hr at 03/25/14 1343    . acetaminophen (TYLENOL) tablet 325 mg  325 mg Oral Q6H PRN Gwen Pounds, MD       Or  . acetaminophen (TYLENOL) suppository 650 mg  650 mg Rectal Q6H PRN Gwen Pounds, MD      . albuterol (PROVENTIL) (2.5 MG/3ML) 0.083% nebulizer solution 2.5 mg  2.5 mg Nebulization QID PRN Gwen Pounds, MD      . busPIRone (BUSPAR) tablet 5 mg  5 mg Oral QHS Gwen Pounds, MD   5 mg at 03/24/14 2229  . docusate sodium  (COLACE) capsule 100 mg  100 mg Oral Daily PRN Gwen Pounds, MD      . doxepin [facility]) capsule 50 mg  50 mg Oral QHS Gwen Pounds, MD   50 mg at 03/24/14 2229  . feeding supplement (PRO-STAT SUGAR FREE 64) liquid 30 mL  30 mL Oral QPC breakfast Gwen Pounds, MD   30 mL at 03/25/14 1203  . feeding supplement (PRO-STAT SUGAR FREE 64) liquid 30 mL  30 mL Oral Daily PRN Gwen Pounds, MD      . feeding supplement (RESOURCE BREEZE) (RESOURCE BREEZE) liquid 1 Container  1 Container Oral TID WC Haynes Bast, RD      . furosemide (  LASIX) tablet 40 mg  40 mg Oral Daily Gwen PoundsJohn M Russo, MD   40 mg at 03/25/14 1203  . lactulose (CHRONULAC) 10 GM/15ML solution 20 g  20 g Oral Once per day on Mon Wed Fri Gwen PoundsJohn M Russo, MD      . methocarbamol (ROBAXIN) tablet 500 mg  500 mg Oral Q8H PRN Gwen PoundsJohn M Russo, MD   500 mg at 03/25/14 1203  . metoCLOPramide (REGLAN) tablet 5 mg  5 mg Oral Q breakfast Gwen PoundsJohn M Russo, MD   5 mg at 03/25/14 1205  . multivitamin with minerals tablet 1 tablet  1 tablet Oral Daily Gwen PoundsJohn M Russo, MD   1 tablet at 03/25/14 1205  . multivitamin-lutein (OCUVITE-LUTEIN) capsule 1 capsule  1 capsule Oral Daily Gwen PoundsJohn M Russo, MD      . nitroGLYCERIN (NITROSTAT) SL tablet 0.4 mg  0.4 mg Sublingual Q5 min PRN Gwen PoundsJohn M Russo, MD      . ondansetron [facility](ZOFRAN) tablet 4 mg  4 mg Oral Q8H PRN Gwen PoundsJohn M Russo, MD   4 mg at 03/25/14 0135  . pantoprazole (PROTONIX) EC tablet 40 mg  40 mg Oral BID Gwen PoundsJohn M Russo, MD   40 mg at 03/25/14 1204  . sodium chloride 0.9 % injection 3 mL  3 mL Intravenous Q12H Gwen PoundsJohn M Russo, MD        Allergies as of 03/24/2014 - Review Complete 03/24/2014  Allergen Reaction Noted  . Codeine Itching 04/21/2013  . Sulfa antibiotics Other (See Comments) 11/09/2011    Family History  Problem Relation Age of Onset  . Heart attack Mother   . Heart disease Mother   . Heart disease Father   . Heart attack Father     History   Social History  . Marital Status: Widowed    Spouse Name: N/A     Number of Children: N/A  . Years of Education: N/A   Occupational History  . Not on file.   Social History Main Topics  . Smoking status: Never Smoker   . Smokeless tobacco: Never Used  . Alcohol Use: No  . Drug Use: No  . Sexual Activity: Not Currently   Other Topics Concern  . Not on file   Social History Narrative  . No narrative on file    Review of Systems: See history of present illness.  Physical Exam: Vital signs in last 24 hours: Temp:  [97.4 F (36.3 C)-98.2 F (36.8 C)] 97.4 F (36.3 C) (04/14 1249) Pulse Rate:  [94-109] 105 (04/14 1124) Resp:  [16] 16 (04/14 0601) BP: (75-128)/(36-76) 114/70 mmHg (04/14 1124) SpO2:  [94 %-99 %] 94 % (04/14 0601) Weight:  [113.263 kg (249 lb 11.2 oz)] 113.263 kg (249 lb 11.2 oz) (04/13 1844) Last BM Date: 03/25/14 This is a pleasant, alert, coherent, articulate, massively obese Caucasian female. Her daughter is at the bedside. The patient is anicteric and really without stigmata of chronic liver disease such as scleral icterus, palmar erythema or spider angiomata. There is no evidence of overt hepatic encephalopathy. The abdomen is massively adipose but there is flank tympany present, suggesting that her paracentesis procedure earlier today was quite effective at reducing, if not actually eliminating, or her ascites. Chest and heart are unremarkable. Abdomen is free of guarding, mass effect, or tenderness. Mild to moderate lower extremity edema  Intake/Output from previous day:   Intake/Output this shift:    Lab Results:  Recent Labs  03/24/14 2005 03/25/14 0638  WBC 10.9* 12.1*  HGB 13.3 13.1  HCT 37.9 37.0  PLT 174 155   BMET  Recent Labs  03/24/14 2005 03/25/14 0638 03/25/14 1259  NA 120* 122* 123*  K 6.4* 6.2* 5.3  CL 87* 90* 89*  CO2 17* 15* 16*  GLUCOSE 114* 94 91  BUN 87* 87* 87*  CREATININE 2.09* 2.08* 2.16*  CALCIUM 8.9 8.6 8.5   LFT  Recent Labs  03/25/14 0638  PROT 5.5*  ALBUMIN 1.9*   AST 164*  ALT 74*  ALKPHOS 107  BILITOT 1.9*   PT/INR  Recent Labs  03/24/14 2005  LABPROT 17.2*  INR 1.44    Studies/Results: US Paracentesis  03/25/2014   CLINICAL DATA:  Recurrent ascites. Request therapeutic paracentesis.  EXAM: ULTRASOUND GUIDED PARACENTESIS  COMPARISON:  Previous paracentesis  PROCEDURE: An ultrasound guided paracentesis was thoroughly discussed with the patient and questions answered. The benefits, risks, alternatives and complications were also discussed. The patient understands and wishes to proceed with the procedure. Written consent was obtained.  Ultrasound was performed to localize and mark an adequate pocket of fluid in the left lower quadrant of the abdomen. The area was then prepped and draped in the normal sterile fashion. 1% Lidocaine was used for local anesthesia. Under ultrasound guidance a 19 gauge Yueh catheter was introduced. Paracentesis was performed. The catheter was removed and a dressing applied.  COMPLICATIONS: None immediate  FINDINGS: A total of approximately 7.7 L of clear yellow fluid was removed. A fluid sample was not sent for laboratory analysis.  IMPRESSION: Successful ultrasound guided paracentesis yielding 7.7 L of ascites.  Read by: Brayton El PA-C   Electronically Signed   By: Richarda Overlie M.D.   On: 03/25/2014 12:21   Dg Chest Port 1 View  03/25/2014   CLINICAL DATA:  Shortness of breath, history hypertension  EXAM: PORTABLE CHEST - 1 VIEW  COMPARISON:  Portable exam 0721 hr compared to 03/02/2014  FINDINGS: Upper normal size of cardiac silhouette.  Atherosclerotic calcification aorta.  Moderate-sized hiatal hernia.  Pulmonary vascularity normal.  No definite infiltrate, pleural effusion or pneumothorax.  Minimal atelectasis left base.  IMPRESSION: Minimal left base atelectasis.  Moderate-sized hiatal hernia.   Electronically Signed   By: Ulyses Southward M.D.   On: 03/25/2014 08:17    Impression: 1. Progressively refractory ascites 2.  Mild decrement in renal function  Plan: 1. Today's visit consisted of approximately 30 minutes at the the bedside, primarily counseling the patient and her daughter regarding the patient's current status and prognosis. Basically, I told them that it is too early to tell whether she has simply had some worsening in her renal function which will plateau (probably necessitating permanent cessation of diuretics, and an increase in the frequency and her paracentesis treatments), or whether it is the beginning of a trend toward acute progressive renal insufficiency, in which case we will need to move to palliative, comfort measures.   2. Albumin infusion today in view of large volume paracentesis, and to possibly tip the balance in favor of recovery of kidney function, or at least avoidance of worsening.   3. Consider Foley placement to monitor urine output if her creatinine does not started to come down by tomorrow after albumin infusion.  4. Hold diuretics, probably indefinitely   LOS: 1 day   Katy Fitch Jaysin Gayler  03/25/2014, 3:54 PM

## 2014-03-25 NOTE — Procedures (Signed)
Successful US guided paracentesis from LLQ.  Yielded 7.7L of clear yellow fluid.  No immediate complications.  Pt tolerated well.   Specimen was not sent for labs.  Brayton ElKevin Johnanna Bakke PA-C 03/25/2014 11:19 AM

## 2014-03-25 NOTE — Progress Notes (Signed)
OT Cancellation Note  Patient Details Name: [name] MRN: [id number] DOB: [birth year]   Cancelled Treatment:    Reason Eval/Treat Not Completed: Medical issues which prohibited therapy. Pt's K+ 6.2 and for paracentisis today--spoke with nurse and we will hold on eval until after procedure.  Evette GeorgesCatherine Eva Taber Sweetser [phone] 03/25/2014, 8:33 AM

## 2014-03-25 NOTE — Progress Notes (Signed)
Addendum to previous note: I do not think the patient needs a fluid restriction. I will stop her IV saline so as to try to avoid reaccumulation of fluid, and instead, aim for volume expansion with colloid (albumin).   Florencia Reasonsobert V. Samaad Hashem, M.D. [phone]

## 2014-03-25 NOTE — Progress Notes (Signed)
PT Cancellation Note  Patient Details Name: [name] MRN: [id number] DOB: 1946/06/10   Cancelled Treatment:    Reason Eval/Treat Not Completed: Patient at procedure or test/unavailable;Other (comment) (Pt at paracentesis earlier and now sleeping soundly.) Will follow up tomorrow for eval.   Angelina OkCary W Rayven Rettig 03/25/2014, 2:24 PM  [facility] Idonia Zollinger PT [phone]

## 2014-03-25 NOTE — Progress Notes (Signed)
Utilization review completed.  

## 2014-03-25 NOTE — Progress Notes (Signed)
Subjective: Admitted from my office yesterday c progressive liver failure on correct meds but now developing Renal Failure and electrolyte issues.  This caused more weakness, AFTT, and confusion. Cr up > 2. K > 6.  IVF started and Kayaxalate given. For another paracentesis today Doing OK mentally. Doing "fine for her" + SOB  Objective: Vital signs in last 24 hours: Temp:  [97.4 F (36.3 C)-98.2 F (36.8 C)] 97.6 F (36.4 C) (04/14 0601) Pulse Rate:  [94-109] 109 (04/14 0601) Resp:  [16] 16 (04/14 0601) BP: (98-128)/(66-76) 98/66 mmHg (04/14 0601) SpO2:  [94 %-99 %] 94 % (04/14 0601) Weight:  [113.263 kg (249 lb 11.2 oz)] 113.263 kg (249 lb 11.2 oz) (04/13 1844) Weight change:  Last BM Date: 03/24/14  CBG (last 3)  No results found for this basename: GLUCAP,  in the last 72 hours  Intake/Output from previous day: No intake or output data in the 24 hours ending 03/25/14 0742     Physical Exam  General appearance: older than stated age.  Some DOE/SOB in bed due to her ascites. Eyes: no scleral icterus Throat: oropharynx moist without erythema Resp: Decreased at bases. Cardio: Reg GI: Distended, Obese, Ascites. Extremities: + edema.  L leg/shin bruise @ area where SCD is squeezing.   Lab Results:  Recent Labs  03/24/14 2005  NA 120*  K 6.4*  CL 87*  CO2 17*  GLUCOSE 114*  BUN 87*  CREATININE 2.09*  CALCIUM 8.9  MG 2.5  PHOS 4.2     Recent Labs  03/24/14 2005  AST 172*  ALT 76*  ALKPHOS 109  BILITOT 2.2*  PROT 5.9*  ALBUMIN 2.0*     Recent Labs  03/24/14 2005 03/25/14 0638  WBC 10.9* 12.1*  HGB 13.3 13.1  HCT 37.9 37.0  MCV 89.0 88.9  PLT 174 155    Lab Results  Component Value Date   INR 1.44 03/24/2014   INR 1.42 03/02/2014   INR 1.38 02/14/2014    No results found for this basename: CKTOTAL, CKMB, CKMBINDEX, TROPONINI,  in the last 72 hours  No results found for this basename: TSH, T4TOTAL, FREET3, T3FREE, THYROIDAB,  in the last 72  hours  No results found for this basename: VITAMINB12, FOLATE, FERRITIN, TIBC, IRON, RETICCTPCT,  in the last 72 hours  Micro Results: No results found for this or any previous visit (from the past 240 hour(s)).   Studies/Results: No results found.   Medications: Scheduled: . busPIRone  5 mg Oral QHS  . doxepin  50 mg Oral QHS  . feeding supplement (PRO-STAT SUGAR FREE 64)  30 mL Oral QPC breakfast  . furosemide  40 mg Oral Daily  . lactulose  20 g Oral Once per day on Mon Wed Fri  . metoCLOPramide  5 mg Oral Q breakfast  . multivitamin with minerals  1 tablet Oral Daily  . multivitamin-lutein  1 capsule Oral Daily  . pantoprazole  40 mg Oral BID  . sodium chloride  3 mL Intravenous Q12H   Continuous: . 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  She has had several recent large Volume ones done.  See 4/3, 3/24, 3/12, 3/4, and 2/9.  Getting a muscle relaxor prior to the procedure helps her relax.  However Flexeril may have been involved in her recent arm rash so we will do a different med.  She is DNR.  She is clearly worsening and may be progressing towards Hepato-renal and hospice palliative care.  We have discussed Pleur-X indwelling Catheter for comfort and progressing towards Hospice Palliative care.  She wants me to attempt one more time to restore some quality and see if I can get her stabilized.  She will get paracentesis today and will redo US +/- another Paracentesis on Thursday.  Will ask GI - Dr Madilyn FiremanHayes group for input.  Doubt TIPS would be of benefit here but will let them comment.  Off BB per Dr Madilyn FiremanHayes.  Ammonia 64 and on low dose lactulose.  Now developing progressive  Azotemia/Renal Failure and electrolyte issues.  She needs Lasix and can no longer handle the Aldactone.  Cr 2.09 yesterday is her worst Cr.  Follow Cr.  Progressive weakness, AFTT, and confusion.  PT/OT/SW consulted.  Protein Cal Malnutrition/Anorexia and chronic Nausea - Nutrition consulted. Daily weights. Zofran/Reglan.  Calorie supplements. Vits.  Hyponatremia - Low dose Normal Saline and free water restrict.  Hyperkalemia - Kayaxalate given.  EKG reviewed and no issues c the Potassium seen.  HTN - BP surprisingly stable.  Anxiety/Depression - Prozac is off. Continue Doxepin. She remains on Buspar.  SOB/DOE due to her fluid status/Vol Overload.  Check CXR  For another paracentesis today.  Plan reviewed c pt and daughter.  DVT Prophylaxis - SCDs.    LOS: 1 day   Elizabeth PoundsJohn M Adelene Jacobson 03/25/2014, 7:42 AM

## 2014-03-25 NOTE — Progress Notes (Addendum)
INITIAL NUTRITION ASSESSMENT  DOCUMENTATION CODES Per approved criteria  -Severe malnutrition in the context of chronic illness -Morbid Obesity   Pt meets criteria for severe MALNUTRITION in the context of chronic illness as evidenced by energy intake </= 75% for >/=1 month and severe fluid accumulation.   INTERVENTION: Provide Resource Breeze (peach) TID with meals, each supplement providing 250 kcal and 9 grams of protein  Continue Pro-stat BID Continue Multivitamin with minerals daily Pt may benefit from additional supplements: may need water soluble forms of vitamin A, vitamin D, vitamin E, vitamin K  Monitor magnesium, potassium, and phosphorus daily for at least 3 days or throughout nutrition repletion, MD to replete as needed, as pt is at risk for refeeding syndrome given severe malnutrition.   NUTRITION DIAGNOSIS: Inadequate oral intake related to poor appetite ongoing as evidenced by pt report of eating <25% of meals for several months.   Goal: Meet >/=90% of estimated nutrition needs   Monitor:  PO intake, supplement acceptance, weight trends, labs   Reason for Assessment: MD Consult   68 y.o. female  Admitting Dx: <principal problem not specified>  ASSESSMENT: 68 y.o. Female with PMHx of cirrhosis, ascites, azotemia, hyponatremia, and hyperkalemia. 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 Paracentesis of 7.7 L on 4/14.   [name] was in room during assessment and explains that pt's appetite is not good and has not been good for months. Pt has been vomiting when she eats and only able to eat 1-2 bites at meals.   She ate <25% of her breakfast this morning, and this is ongoing. Pt is residing in an AL facility and is on a low sodium diet there, however pt has her own refrigerator and keeps high sodium foods. Encouraged pt not to consume those foods, but to make sure she is eating protein and calories. Pt is currently taking Prostat at least once a day  and trying to take twice a day.   Unable to perform physical exam on patient because of fluid overload and obesity, however patient states that she is very weak. Pt has not had a significant amount of weight loss per pt's daughter, as her dry weight is normally 240 lb, fluctuating to 265 lb with fluid, and her current weight is 249 lb.   Pt explained that she would be interested in a juice supplement more than the vanilla flavor Nepro.   Labs: Low sodium High BUN, Cr Elevated potassium Mag and phos are currently WNL   Height: Ht Readings from Last 1 Encounters:  03/24/14 5\' 6"  (1.676 m)    Weight: Wt Readings from Last 1 Encounters:  03/24/14 249 lb 11.2 oz (113.263 kg)    Ideal Body Weight: 130 lb  % Ideal Body Weight: 192%   Wt Readings from Last 10 Encounters:  03/24/14 249 lb 11.2 oz (113.263 kg)  02/14/14 244 lb (110.678 kg)  09/25/13 274 lb 0.5 oz (124.3 kg)  08/08/13 302 lb (136.986 kg)  04/22/13 280 lb (127.007 kg)  04/22/13 280 lb (127.007 kg)  05/17/12 270 lb (122.471 kg)  11/09/11 256 lb (116.121 kg)    Usual Body Weight: 245-265  % Usual Body Weight: 100%   BMI:  Body mass index is 40.32 kg/(m^2).,obesity class III   Estimated Nutritional Needs: Kcal: 2000-2200 Protein: 130 - 150 g Fluid: 1- 1.2 L/day  Skin: Ecchymosis Right and Left Arms and Legs   Diet Order: Diabetic with 1200 ml Fluid Restriction  EDUCATION NEEDS: -No education  needs identified at this time  No intake or output data in the 24 hours ending 03/25/14 1359  Last BM: 4/14   Labs:   Recent Labs Lab 03/24/14 2005 03/25/14 0638  NA 120* 122*  K 6.4* 6.2*  CL 87* 90*  CO2 17* 15*  BUN 87* 87*  CREATININE 2.09* 2.08*  CALCIUM 8.9 8.6  MG 2.5  --   PHOS 4.2  --   GLUCOSE 114* 94    CBG (last 3)  No results found for this basename: GLUCAP,  in the last 72 hours No results found for this basename: HGBA1C    Scheduled Meds: . busPIRone  5 mg Oral QHS  . doxepin   50 mg Oral QHS  . feeding supplement (PRO-STAT SUGAR FREE 64)  30 mL Oral QPC breakfast  . furosemide  40 mg Oral Daily  . lactulose  20 g Oral Once per day on Mon Wed Fri  . metoCLOPramide  5 mg Oral Q breakfast  . multivitamin with minerals  1 tablet Oral Daily  . multivitamin-lutein  1 capsule Oral Daily  . pantoprazole  40 mg Oral BID  . sodium chloride  3 mL Intravenous Q12H    Continuous Infusions: . sodium chloride 50 mL/hr at 03/25/14 1343    Past Medical History  Diagnosis Date  . Exogenous obesity   . Hypertension   . Anxiety and depression     chronic  . Hiatal hernia   . Atypical chest pain 06/10/03    normal 2 day adenosine cardiolite  . Memory changes   . Bipolar disorder   . Ascites   . Cirrhosis   . Anginal pain   . Exertional shortness of breath   . Shortness of breath     "all the time right now" (09/19/2013)  . Anemia   . History of blood transfusion 05/2013    "once; related to a stomach ulcer" (09/19/2013)  . Bleeding stomach ulcer 04/22/2013  . GERD (gastroesophageal reflux disease)   . Osteoarthritis   . Arthritis     "all over my body" (09/19/2013)  . Anxiety   . Depression   . Fatty liver disease, nonalcoholic   . CVA (cerebral infarction) 04/21/13    CCT c/w Left cerebellar infarct which appears subacute to remote    Past Surgical History  Procedure Laterality Date  . Tubal ligation  1977  . Cardiac catheterization  1990    normal cath  . Esophagogastroduodenoscopy N/A 04/22/2013    Procedure: ESOPHAGOGASTRODUODENOSCOPY (EGD);  Surgeon: Shirley FriarVincent C. Schooler, MD;  [facility];  Service: Endoscopy;  Laterality: N/A;  . Paracentesis  09/13/2013    "just once" (09/19/2013)  . Carpal tunnel release Bilateral 1990's  . Knee arthroscopy w/ acl reconstruction Bilateral ?1990's  . Vaginal hysterectomy  1980  . Salpingoophorectomy Bilateral 1990's    Eppie Gibsonebekah L Matznick, BS Nutrition Intern Pager: [phone]   I agree with the above  information and made appropriate revisions. Jarold MottoSamantha Haruki Arnold MS, RD, LDN Inpatient Registered Dietitian Pager: [phone] After-hours pager: [phone]

## 2014-03-26 LAB — COMPREHENSIVE METABOLIC PANEL
ALT: 56 U/L — ABNORMAL HIGH (ref 0–35)
AST: 138 U/L — ABNORMAL HIGH (ref 0–37)
Albumin: 2.5 g/dL — ABNORMAL LOW (ref 3.5–5.2)
Alkaline Phosphatase: 84 U/L (ref 39–117)
BUN: 84 mg/dL — ABNORMAL HIGH (ref 6–23)
CO2: 18 meq/L — AB (ref 19–32)
Calcium: 8.4 mg/dL (ref 8.4–10.5)
Chloride: 93 mEq/L — ABNORMAL LOW (ref 96–112)
Creatinine, Ser: 1.98 mg/dL — ABNORMAL HIGH (ref 0.50–1.10)
GFR calc non Af Amer: 25 mL/min — ABNORMAL LOW (ref >90)
GFR, EST AFRICAN AMERICAN: 29 mL/min — AB (ref >90)
GLUCOSE: 64 mg/dL — AB (ref 70–99)
POTASSIUM: 4.8 meq/L (ref 3.7–5.3)
Sodium: 126 mEq/L — ABNORMAL LOW (ref 137–147)
TOTAL PROTEIN: 5.1 g/dL — AB (ref 6.0–8.3)
Total Bilirubin: 2 mg/dL — ABNORMAL HIGH (ref 0.3–1.2)

## 2014-03-26 LAB — CBC
HCT: 30.4 % — ABNORMAL LOW (ref 36.0–46.0)
Hemoglobin: 10.9 g/dL — ABNORMAL LOW (ref 12.0–15.0)
MCH: 31.7 pg (ref 26.0–34.0)
MCHC: 35.9 g/dL (ref 30.0–36.0)
MCV: 88.4 fL (ref 78.0–100.0)
Platelets: 96 10*3/uL — ABNORMAL LOW (ref 150–400)
RBC: 3.44 MIL/uL — ABNORMAL LOW (ref 3.87–5.11)
RDW: 16.4 % — ABNORMAL HIGH (ref 11.5–15.5)
WBC: 6.2 10*3/uL (ref 4.0–10.5)

## 2014-03-26 MED ORDER — ALBUMIN HUMAN 25 % IV SOLN
50.0000 g | Freq: Once | INTRAVENOUS | Status: AC
  Administered 2014-03-27: 50 g via INTRAVENOUS
  Filled 2014-03-26 (×2): qty 200

## 2014-03-26 MED ORDER — ALUM & MAG HYDROXIDE-SIMETH 200-200-20 MG/5ML PO SUSP
30.0000 mL | ORAL | Status: DC | PRN
  Administered 2014-03-26: 30 mL via ORAL
  Filled 2014-03-26: qty 30

## 2014-03-26 MED ORDER — SODIUM CHLORIDE 0.9 % IV BOLUS (SEPSIS)
250.0000 mL | Freq: Once | INTRAVENOUS | Status: AC
  Administered 2014-03-26: 250 mL via INTRAVENOUS

## 2014-03-26 NOTE — Evaluation (Signed)
Occupational Therapy Evaluation Patient Details Name: [name] MRN: [id number] DOB: 02/28/1946 Today's Date: 03/26/2014    History of Present Illness [name] is a 68 y.o. female with [name] cirrhosis admitted to the hospital by her primary physician because of refractory ascites.    Clinical Impression   Pt demonstrates decline in function with ADLs and ADL mobility safety with decreased strength, balance and endurance. Pt would benefit from acute OT services to address impairments to increase level of function and safety    Follow Up Recommendations  SNF;Supervision/Assistance - 24 hour;Home health OT;Other (comment) (HH depending on acute care progress)    Equipment Recommendations  None recommended by OT    Recommendations for Other Services       Precautions / Restrictions Precautions Precautions: Fall Restrictions Weight Bearing Restrictions: No      Mobility Bed Mobility Overal bed mobility: Needs Assistance Bed Mobility: Supine to Sit;Sit to Supine     Supine to sit: Max assist;HOB elevated Sit to supine: Max assist   General bed mobility comments: assist to advance LEs to EOB and to elevate trunk using rails. Total A + 2 to [facility] using pads  Transfers                 General transfer comment: unable with 1 person assist    Balance Overall balance assessment: Needs assistance Sitting-balance support: Bilateral upper extremity supported;Single extremity supported;Feet supported Sitting balance-Leahy Scale: Fair Sitting balance - Comments: Pt sat EOB for grooming and ADLs with min guard A - min A for balance/support                                    ADL Overall ADL's : Needs assistance/impaired     Grooming: Wash/dry hands;Wash/dry face;Oral care;Min guard;Set up Grooming Details (indicate cue type and reason): min guard A for balance/support Upper Body Bathing: Minimal assitance;Sitting Upper Body  Bathing Details (indicate cue type and reason): min A - min guard A for balance/support Lower Body Bathing: Maximal assistance;Sitting/lateral leans   Upper Body Dressing : Minimal assistance;Sitting Upper Body Dressing Details (indicate cue type and reason): min guard A for balance/support Lower Body Dressing: Total assistance;Sitting/lateral leans     Toilet Transfer Details (indicate cue type and reason): pt unbale with 1 person assist Toileting- Clothing Manipulation and Hygiene: Total assistance               Vision  does not wear galsses                              Pertinent Vitals/Pain No c/o pain, VSS     Hand Dominance Right   Extremity/Trunk Assessment Upper Extremity Assessment Upper Extremity Assessment: Generalized weakness   Lower Extremity Assessment Lower Extremity Assessment: Defer to PT evaluation       Communication Communication Communication: No difficulties   Cognition Arousal/Alertness: Awake/alert Behavior During Therapy: WFL for tasks assessed/performed Overall Cognitive Status: Within Functional Limits for tasks assessed                     General Comments   Pt pleasant and cooperative                 Home Living Family/patient expects to be discharged to:: Assisted living, however states that she would not object to SNF for  therapy if she needs it                             Home Equipment: Walker - 2 wheels;Adaptive equipment;Toilet riser;Grab bars - toilet;Grab bars - tub/shower;Shower seat Adaptive Equipment: Reacher;Sock aid;Long-handled shoe horn;Long-handled sponge        Prior Functioning/Environment Level of Independence: Needs assistance  Gait / Transfers Assistance Needed: uses RW and wc 50/50 per pt report ADL's / Homemaking Assistance Needed: staff provdes 50% of assist with ADLs, mostly LB        OT Diagnosis: Generalized weakness   OT Problem List: Decreased  strength;Decreased activity tolerance;Impaired balance (sitting and/or standing)   OT Treatment/Interventions: Self-care/ADL training;Therapeutic exercise;Patient/family education;Neuromuscular education;Balance training;Therapeutic activities;DME and/or AE instruction    OT Goals(Current goals can be found in the care plan section) Acute Rehab OT Goals Patient Stated Goal: to feel better OT Goal Formulation: With patient/family Time For Goal Achievement: 04/02/14 Potential to Achieve Goals: Good ADL Goals Pt Will Perform Grooming: with set-up;with supervision;sitting (EOB) Pt Will Perform Upper Body Bathing: with min guard assist;with supervision;with set-up;sitting (EOB) Pt Will Perform Lower Body Bathing: with mod assist;sitting/lateral leans;sit to/from stand Pt Will Perform Upper Body Dressing: with min guard assist;with supervision;with set-up;sitting (EOB) Pt Will Transfer to Toilet: with max assist;with mod assist;bedside commode Additional ADL Goal #1: Pt will complete bed mobility with mod  - min A to sit EOB in prep for ADLs  OT Frequency: Min 2X/week   Barriers to D/C: Decreased caregiver support                        End of Session    Activity Tolerance: Patient limited by fatigue Patient left: in bed;with family/visitor present;with call bell/phone within reach   Time: [id number] OT Time Calculation (min): 29 min Charges:  OT General Charges $OT Visit: 1 Procedure OT Evaluation $Initial OT Evaluation Tier I: 1 Procedure OT Treatments $Therapeutic Activity: 8-22 mins G-Codes:    Lafe GarinDenise J Yannis Gumbs 03/26/2014, 2:02 PM

## 2014-03-26 NOTE — Progress Notes (Signed)
Pt c/o chest cramping and feels like "indigestion."  VS BP 72/46, HR 93, temp 97.6.  Paged Dr. Sherrye PayorParini.  Will give 250cc bolus over 2 hours and treat indigestion.  Will continue to monitor.

## 2014-03-26 NOTE — Progress Notes (Signed)
PT Cancellation Note  Patient Details Name: [name] MRN: [id number] DOB: 11-02-1946   Cancelled Treatment:    Reason Eval/Treat Not Completed: Medical issues which prohibited therapy (patient experiencing diarrhea)   Sela HildingMarjorie M CarlinvilleMoton, PT [phone] 03/26/2014, 2:38 PM

## 2014-03-26 NOTE — Progress Notes (Signed)
Stabilization of renal function noted. No need for Foley catheter to monitor urine output.  Dr. Ferd Hibbsusso's note reviewed; I agree, particularly with the need to avoid further diuretic therapy.  Discussed patient's condition with the patient and her daughter, Alcario Droughtrica, at the bedside. The patient appears to have hit a "new plateau," and thus is stable at this time (although we don't know for how long; I emphasized that she is stable in the setting of a terminal illness).  I anticipate that the patient will probably need somewhat more frequent paracentesis treatments now the diuretic therapy is being completely stopped. It might find it helpful to have the patient prescheduled for a paracentesis on a weekly basis (for example, every Tuesday). She should receive albumin with each paracentesis.  Florencia Reasonsobert V. Damascus Feldpausch, M.D. [phone]

## 2014-03-26 NOTE — Progress Notes (Signed)
Subjective: Appreciate Dr Matthias HughsBuccini input. Cr, Na and K are all some better this am. She is happier post 7+ L drainage.  The Albumin was a good idea. Doing better mentally and joking some.  She is more comfortable Doing "fine for her"   Objective: Vital signs in last 24 hours: Temp:  [97.4 F (36.3 C)-97.8 F (36.6 C)] 97.8 F (36.6 C) (04/15 13080633) Pulse Rate:  [93-105] 93 (04/15 0633) Resp:  [18] 18 (04/15 0633) BP: (75-114)/(36-70) 100/61 mmHg (04/15 0633) SpO2:  [96 %-98 %] 98 % (04/15 65780633) Weight:  [113.399 kg (250 lb)] 113.399 kg (250 lb) (04/15 46960633) Weight change: 0.136 kg (4.8 oz) Last BM Date: 03/25/14  CBG (last 3)  No results found for this basename: GLUCAP,  in the last 72 hours  Intake/Output from previous day: No intake or output data in the 24 hours ending 03/26/14 0745     Physical Exam  General appearance: older than stated age.   Eyes: no scleral icterus Throat: oropharynx moist without erythema Resp: Decreased at bases. Cardio: Reg GI: Less Distended, Softer, Obese, Ascites. Extremities: Less edema.  L leg/shin bruise better.   Lab Results:  Recent Labs  03/24/14 2005  03/25/14 1259 03/26/14 0555  NA 120*  < > 123* 126*  K 6.4*  < > 5.3 4.8  CL 87*  < > 89* 93*  CO2 17*  < > 16* 18*  GLUCOSE 114*  < > 91 64*  BUN 87*  < > 87* 84*  CREATININE 2.09*  < > 2.16* 1.98*  CALCIUM 8.9  < > 8.5 8.4  MG 2.5  --   --   --   PHOS 4.2  --   --   --   < > = values in this interval not displayed.   Recent Labs  03/25/14 0638 03/26/14 0555  AST 164* 138*  ALT 74* 56*  ALKPHOS 107 84  BILITOT 1.9* 2.0*  PROT 5.5* 5.1*  ALBUMIN 1.9* 2.5*     Recent Labs  03/25/14 0638 03/26/14 0555  WBC 12.1* 6.2  HGB 13.1 10.9*  HCT 37.0 30.4*  MCV 88.9 88.4  PLT 155 PENDING    Lab Results  Component Value Date   INR 1.44 03/24/2014   INR 1.42 03/02/2014   INR 1.38 02/14/2014    No results found for this basename: CKTOTAL, CKMB, CKMBINDEX,  TROPONINI,  in the last 72 hours  No results found for this basename: TSH, T4TOTAL, FREET3, T3FREE, THYROIDAB,  in the last 72 hours  No results found for this basename: VITAMINB12, FOLATE, FERRITIN, TIBC, IRON, RETICCTPCT,  in the last 72 hours  Micro Results: No results found for this or any previous visit (from the past 240 hour(s)).   Studies/Results: [place] Paracentesis  03/25/2014   CLINICAL DATA:  Recurrent ascites. Request therapeutic paracentesis.  EXAM: ULTRASOUND GUIDED PARACENTESIS  COMPARISON:  Previous paracentesis  PROCEDURE: An ultrasound guided paracentesis was thoroughly discussed with the patient and questions answered. The benefits, risks, alternatives and complications were also discussed. The patient understands and wishes to proceed with the procedure. Written consent was obtained.  Ultrasound was performed to localize and mark an adequate pocket of fluid in the left lower quadrant of the abdomen. The area was then prepped and draped in the normal sterile fashion. 1% Lidocaine was used for local anesthesia. Under ultrasound guidance a 19 gauge Yueh catheter was introduced. Paracentesis was performed. The catheter was removed and a dressing applied.  COMPLICATIONS:  None immediate  FINDINGS: A total of approximately 7.7 L of clear yellow fluid was removed. A fluid sample was not sent for laboratory analysis.  IMPRESSION: Successful ultrasound guided paracentesis yielding 7.7 L of ascites.  Read by: Brayton ElKevin Bruning PA-C   Electronically Signed   By: Richarda OverlieAdam  Henn M.D.   On: 03/25/2014 12:21   Dg Chest Port 1 View  03/25/2014   CLINICAL DATA:  Shortness of breath, history hypertension  EXAM: PORTABLE CHEST - 1 VIEW  COMPARISON:  Portable exam 0721 hr compared to 03/02/2014  FINDINGS: Upper normal size of cardiac silhouette.  Atherosclerotic calcification aorta.  Moderate-sized hiatal hernia.  Pulmonary vascularity normal.  No definite infiltrate, pleural effusion or pneumothorax.  Minimal  atelectasis left base.  IMPRESSION: Minimal left base atelectasis.  Moderate-sized hiatal hernia.   Electronically Signed   By: Ulyses SouthwardMark  Boles M.D.   On: 03/25/2014 08:17     Medications: Scheduled: . antiseptic oral rinse  15 mL Mouth Rinse BID  . busPIRone  5 mg Oral QHS  . doxepin  50 mg Oral QHS  . feeding supplement (PRO-STAT SUGAR FREE 64)  30 mL Oral QPC breakfast  . feeding supplement (RESOURCE BREEZE)  1 Container Oral TID WC  . lactulose  20 g Oral Once per day on Mon Wed Fri  . metoCLOPramide  5 mg Oral Q breakfast  . multivitamin with minerals  1 tablet Oral Daily  . multivitamin-lutein  1 capsule Oral Daily  . pantoprazole  40 mg Oral BID  . 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  Appreciate Dr Buccini's help. The Albumin seemed to help.  She has proven that she cannot handle the diuretics at this point and they are off.  IVF is also off.  She is being fluid restricted.  She is DNR.  She is clearly worsening and may be progressing towards Hepato-renal and hospice palliative care.  We have discussed Pleur-X indwelling Catheter for comfort and progressing towards a Hospice Palliative care approach only.  She wants me to attempt one more time to restore some quality and see if I can get her stabilized - the good news is her parameters are a little better this am and she looks better.  I will order another paracentesis for tomorrow and stop @ 4-5 L to avoid too much fluid shifting and follow it c more albumin.   Ammonia 64 and on low dose lactulose.  Azotemia/Renal Failure and electrolyte issues - mild to mod improvement - follow     Progressive weakness, AFTT, and confusion.  PT/OT/SW consulted.  Protein Cal Malnutrition/Anorexia and chronic Nausea - Nutrition consulted and plan was: Resource Breeze (peach) TID with meals, each supplement providing 250 kcal and 9 grams of protein  Continue Pro-stat BID  Continue Multivitamin with minerals daily  Pt may benefit from additional supplements: may need water soluble forms of vitamin A, vitamin D, vitamin E, vitamin K  Monitor magnesium, potassium, and phosphorus daily for at least 3 days or throughout nutrition repletion, MD to replete as needed, as pt is at risk for refeeding syndrome given severe malnutrition. Daily  weights. Zofran/Reglan.  Calorie supplements. Vits.  Hyponatremia - free water restrict.  Hyperkalemia - Kayaxalate given. K better. EKG reviewed and no issues.  HTN - BP surprisingly stable.  It did drop some yesterday  Anxiety/Depression - Prozac is off. Continue Doxepin. She remains on Buspar.  SOB/DOE due to her fluid status/Vol Overload.  CXR s surprises.  Use the Incentive spirometer  Plan reviewed c pt and daughter.  DVT Prophylaxis - SCDs.  Dispo - plan back to her AL Friday unless SNF needed    LOS: 2 days   Gwen Pounds 03/26/2014, 7:45 AM

## 2014-03-27 ENCOUNTER — Inpatient Hospital Stay (HOSPITAL_COMMUNITY): Payer: Medicare Other

## 2014-03-27 LAB — COMPREHENSIVE METABOLIC PANEL
ALBUMIN: 2.3 g/dL — AB (ref 3.5–5.2)
ALK PHOS: 95 U/L (ref 39–117)
ALT: 61 U/L — ABNORMAL HIGH (ref 0–35)
AST: 164 U/L — ABNORMAL HIGH (ref 0–37)
BUN: 82 mg/dL — ABNORMAL HIGH (ref 6–23)
CALCIUM: 8.3 mg/dL — AB (ref 8.4–10.5)
CO2: 16 mEq/L — ABNORMAL LOW (ref 19–32)
CREATININE: 1.89 mg/dL — AB (ref 0.50–1.10)
Chloride: 90 mEq/L — ABNORMAL LOW (ref 96–112)
GFR calc Af Amer: 31 mL/min — ABNORMAL LOW (ref >90)
GFR calc non Af Amer: 26 mL/min — ABNORMAL LOW (ref >90)
GLUCOSE: 82 mg/dL (ref 70–99)
Potassium: 4.6 mEq/L (ref 3.7–5.3)
Sodium: 124 mEq/L — ABNORMAL LOW (ref 137–147)
TOTAL PROTEIN: 4.9 g/dL — AB (ref 6.0–8.3)
Total Bilirubin: 1.6 mg/dL — ABNORMAL HIGH (ref 0.3–1.2)

## 2014-03-27 LAB — MAGNESIUM: Magnesium: 2.4 mg/dL (ref 1.5–2.5)

## 2014-03-27 LAB — CBC
HCT: 31.6 % — ABNORMAL LOW (ref 36.0–46.0)
HEMOGLOBIN: 11 g/dL — AB (ref 12.0–15.0)
MCH: 30.9 pg (ref 26.0–34.0)
MCHC: 34.8 g/dL (ref 30.0–36.0)
MCV: 88.8 fL (ref 78.0–100.0)
Platelets: 92 10*3/uL — ABNORMAL LOW (ref 150–400)
RBC: 3.56 MIL/uL — AB (ref 3.87–5.11)
RDW: 16.1 % — AB (ref 11.5–15.5)
WBC: 6.7 10*3/uL (ref 4.0–10.5)

## 2014-03-27 LAB — PHOSPHORUS: PHOSPHORUS: 3 mg/dL (ref 2.3–4.6)

## 2014-03-27 NOTE — Progress Notes (Signed)
Patient still complaining of chest cramping after prn maalox given at 1942. Per patient and daughter this happens after a paracentesis and usually takes robaxin to help. Given robaxin prn at 2301. Pt states that the prn robaxin provided relief.

## 2014-03-27 NOTE — Progress Notes (Signed)
The patient had a further 5 L paracentesis today, and is feeling well.  Her renal function is stable, and in fact, shows a tiny bit of further improvement.  As stated previously, I think this patient will be best off all diuretic therapy, and on a regimen of regular scheduled weekly large volume paracentesis with albumin infusion, until such time as she becomes more unstable. It is hard to predict how long she might go on that regimen before further decompensation occurs.  I will sign off at this time, but please feel free to call me if further input is desired. It appears that the patient may be sufficiently stable to return to assisted-living tomorrow, but I will leave that decision to her attending physician.  Florencia Reasonsobert V. Takerra Lupinacci, M.D. [phone]

## 2014-03-27 NOTE — Progress Notes (Signed)
Subjective: labs are stable comfortable Doing "fine for her" Eating a little better  Objective: Vital signs in last 24 hours: Temp:  [97.3 F (36.3 C)-97.9 F (36.6 C)] 97.3 F (36.3 C) (04/16 0528) Pulse Rate:  [92-94] 94 (04/16 0528) Resp:  [18] 18 (04/16 0528) BP: (72-98)/(46-61) 95/57 mmHg (04/16 0528) SpO2:  [98 %-99 %] 98 % (04/16 0528) Weight:  [113 kg (249 lb 1.9 oz)] 113 kg (249 lb 1.9 oz) (04/16 0528) Weight change: -0.399 kg (-14.1 oz) Last BM Date: 03/26/14  CBG (last 3)  No results found for this basename: GLUCAP,  in the last 72 hours  Intake/Output from previous day:  Intake/Output Summary (Last 24 hours) at 03/27/14 0748 Last data filed at 03/27/14 0400  Gross per 24 hour  Intake    714 ml  Output      0 ml  Net    714 ml   04/15 0701 - 04/16 0700 In: 714 [P.O.:714] Out: -    Physical Exam  General appearance: older than stated age.   Eyes: no scleral icterus Throat: oropharynx moist without erythema Resp: Decreased at bases. Cardio: Reg GI: Less Distended, Softer, Obese, Ascites. Extremities: Less edema.  L leg/shin bruise better.   Lab Results:  Recent Labs  03/24/14 2005  03/26/14 0555 03/27/14 0350  NA 120*  < > 126* 124*  K 6.4*  < > 4.8 4.6  CL 87*  < > 93* 90*  CO2 17*  < > 18* 16*  GLUCOSE 114*  < > 64* 82  BUN 87*  < > 84* 82*  CREATININE 2.09*  < > 1.98* 1.89*  CALCIUM 8.9  < > 8.4 8.3*  MG 2.5  --   --  2.4  PHOS 4.2  --   --  3.0  < > = values in this interval not displayed.   Recent Labs  03/26/14 0555 03/27/14 0350  AST 138* 164*  ALT 56* 61*  ALKPHOS 84 95  BILITOT 2.0* 1.6*  PROT 5.1* 4.9*  ALBUMIN 2.5* 2.3*     Recent Labs  03/26/14 0555 03/27/14 0350  WBC 6.2 6.7  HGB 10.9* 11.0*  HCT 30.4* 31.6*  MCV 88.4 88.8  PLT 96* 92*    Lab Results  Component Value Date   INR 1.44 03/24/2014   INR 1.42 03/02/2014   INR 1.38 02/14/2014    No results found for this basename: CKTOTAL, CKMB, CKMBINDEX,  TROPONINI,  in the last 72 hours  No results found for this basename: TSH, T4TOTAL, FREET3, T3FREE, THYROIDAB,  in the last 72 hours  No results found for this basename: VITAMINB12, FOLATE, FERRITIN, TIBC, IRON, RETICCTPCT,  in the last 72 hours  Micro Results: No results found for this or any previous visit (from the past 240 hour(s)).   Studies/Results: US Paracentesis  03/25/2014   CLINICAL DATA:  Recurrent ascites. Request therapeutic paracentesis.  EXAM: ULTRASOUND GUIDED PARACENTESIS  COMPARISON:  Previous paracentesis  PROCEDURE: An ultrasound guided paracentesis was thoroughly discussed with the patient and questions answered. The benefits, risks, alternatives and complications were also discussed. The patient understands and wishes to proceed with the procedure. Written consent was obtained.  Ultrasound was performed to localize and mark an adequate pocket of fluid in the left lower quadrant of the abdomen. The area was then prepped and draped in the normal sterile fashion. 1% Lidocaine was used for local anesthesia. Under ultrasound guidance a 19 gauge Yueh catheter was introduced. Paracentesis was performed.  The catheter was removed and a dressing applied.  COMPLICATIONS: None immediate  FINDINGS: A total of approximately 7.7 L of clear yellow fluid was removed. A fluid sample was not sent for laboratory analysis.  IMPRESSION: Successful ultrasound guided paracentesis yielding 7.7 L of ascites.  Read by: Brayton ElKevin Bruning PA-C   Electronically Signed   By: Richarda OverlieAdam  Henn M.D.   On: 03/25/2014 12:21     Medications: Scheduled: . albumin human  50 g Intravenous Once  . antiseptic oral rinse  15 mL Mouth Rinse BID  . busPIRone  5 mg Oral QHS  . doxepin  50 mg Oral QHS  . feeding supplement (PRO-STAT SUGAR FREE 64)  30 mL Oral QPC breakfast  . feeding supplement (RESOURCE BREEZE)  1 Container Oral TID WC  . lactulose  20 g Oral Once per day on Mon Wed Fri  . metoCLOPramide  5 mg Oral Q  breakfast  . multivitamin with minerals  1 tablet Oral Daily  . multivitamin-lutein  1 capsule Oral Daily  . pantoprazole  40 mg Oral BID  . 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  Appreciate Dr Buccini's help. The Albumin seemed to help.  She has proven that she cannot handle the diuretics at this point and they are off.  IVF is also off.   She is DNR.  She is clearly worsening and may be progressing towards Hepato-renal and hospice palliative care but can delay this discussion for a few more weeks/months?.  We have discussed Pleur-X indwelling Catheter for comfort and progressing towards a Hospice Palliative care approach only.  She wants me to attempt one more time to restore some quality and see if I can get her stabilized - the good news is her parameters are a little betterand she looks better.  I will order another paracentesis for today and stop @ 4-5 L to avoid too much fluid shifting and follow it c more albumin.   Ammonia 64 and on low dose lactulose.  We discussed what Dr B recommended - once per week Paracentesis  Azotemia/Renal Failure and electrolyte issues - mild to mod improvement - follow Cr down to 1.89    Progressive weakness, AFTT, and confusion.  PT/OT/SW consulted.  Protein Cal Malnutrition/Anorexia and chronic Nausea - Nutrition consulted and plan was: Resource Breeze (peach) TID with meals, each supplement providing 250 kcal and 9 grams of protein  Continue Pro-stat BID  Continue Multivitamin with minerals daily  Pt may benefit from additional supplements: may need water soluble forms of vitamin A, vitamin D,  vitamin E, vitamin K  Monitor magnesium, potassium, and phosphorus daily for at least 3 days or throughout nutrition repletion, MD to replete as needed, as pt is at risk for refeeding syndrome given severe malnutrition. Daily weights. Zofran/Reglan.  Calorie supplements. Vits.  Hyponatremia - continue free water restrict.  If Kidney function improves the Na should also get better  Hyperkalemia -  K better.   HTN - BP surprisingly stable but lowish - off all meds.  Anxiety/Depression - Prozac is off. Continue Doxepin. She remains on Buspar.  SOB/DOE due  to her fluid status/Vol Overload.  CXR s surprises.  Use the Incentive spirometer  Plan reviewed c pt and daughter.  DVT Prophylaxis - SCDs.  Dispo - plan back to her AL Friday unless SNF needed    LOS: 3 days   Elizabeth PoundsJohn M Demarlo Jacobson 03/27/2014, 7:48 AM

## 2014-03-27 NOTE — Procedures (Signed)
US guided therapeutic paracentesis performed yielding 5 liters (maximum ordered) amber fluid. No immediate complications.

## 2014-03-27 NOTE — Clinical Social Work Psychosocial (Signed)
Clinical Social Work Department BRIEF PSYCHOSOCIAL ASSESSMENT 03/27/2014  Patient:  [name], [name]     Account Number:  [id number]     Admit date:  03/24/2014  Clinical Social Worker:  Lovey Newcomer  Date/Time:  03/27/2014 01:30 PM  Referred by:  Physician  Date Referred:  03/27/2014 Referred for  ALF Placement   Other Referral:   Interview type:  Patient Other interview type:   Patient alert and oriented at time of assessment.    PSYCHOSOCIAL DATA Living Status:  FACILITY Admitted from facility:  [facility] Level of care:  Assisted Living Primary support name:  Gregery Na Primary support relationship to patient:  CHILD, ADULT Degree of support available:   Support is adequate.    CURRENT CONCERNS Current Concerns  Post-Acute Placement   Other Concerns:    SOCIAL WORK ASSESSMENT / PLAN CSW met with patient to discuss her plan after DC. Patient confirms that she is from [facility] and states that she plans to return to [facility] [place]. CSW notes that PT has recommended HHPT for patient at ALF. Patient states that she has lived at [facility] since July of last year and is very happy there. She states, "It's been like a home away from home." Patient states that she has good family support and is looking forward to returning to [facility]. CSW has faxed patient's information to [facility] and the facility states that they will have to come to hospital to assess patient to clear her to return to ALF. Facility states that they should be able to do this this afternoon or in the morning.   Assessment/plan status:  Psychosocial Support/Ongoing Assessment of Needs Other assessment/ plan:   Completed FL2 put on chart for MD signature, DNR form placed on patient's chart for MD's signature.   Information/referral to community resources:   CSW contact information given to patient.    PATIENT'S/FAMILY'S RESPONSE TO PLAN OF  CARE: Patient states that she plans to return to [facility] at discharge. Patient was pleasant, appropriate, and apprecitative of CSW visit. Patient was in good spirits and is happy to return to [place]. CSW will assist with DC.       [name] MSW, [place], [place], [phone]

## 2014-03-27 NOTE — Evaluation (Signed)
Physical Therapy Evaluation Patient Details Name: [name] MRN: [id number] DOB: [birth year] Today's Date: 03/27/2014   History of Present Illness  [name] is a 68 y.o. female with [name] cirrhosis admitted to the hospital by her primary physician because of refractory ascites. Pt requiring multiple paracentesises.  Clinical Impression  Pt admitted with above. Pt currently with functional limitations due to the deficits listed below (see PT Problem List).  Pt will benefit from skilled PT to increase their independence and safety with mobility to allow discharge back to ALF with HHPT. Pt and daughter report ALF staff willing/able to continue to assist pt.     Follow Up Recommendations Home health PT (at ALF)    Equipment Recommendations  None recommended by PT    Recommendations for Other Services       Precautions / Restrictions Precautions Precautions: Fall      Mobility  Bed Mobility Overal bed mobility: Needs Assistance Bed Mobility: Supine to Sit;Sit to Supine;Sit to Sidelying     Supine to sit: +2 for physical assistance;Max assist   Sit to sidelying: +2 for physical assistance;Max assist General bed mobility comments: Assist to manage LE's and bring trunk up into sitting and hips to EOB. Assist to bring legs back up into bed and lower your trunk.  Transfers Overall transfer level: Needs assistance Equipment used: Rolling walker (2 wheeled) Transfers: Sit to/from Stand Sit to Stand: +2 physical assistance;Min assist         General transfer comment: Assist to bring hips up. Pt stood x 45 sec with walker with min A. Pt became light-headed and then returned to sitting.  Ambulation/Gait                Stairs            Wheelchair Mobility    Modified Rankin (Stroke Patients Only)       Balance   Sitting-balance support: Bilateral upper extremity supported;Feet supported Sitting balance-Leahy Scale: Poor Sitting balance -  Comments: Needs UE support to maintain balance.   Standing balance support: Bilateral upper extremity supported Standing balance-Leahy Scale: Poor Standing balance comment: Requires support of walker and assist.                             Pertinent Vitals/Pain Pt with pain all over. Nurse bringing meds.    Home Living Family/patient expects to be discharged to:: Assisted living               Home Equipment: Dan HumphreysWalker - 2 wheels;Adaptive equipment;Toilet riser;Grab bars - toilet;Grab bars - tub/shower;Shower seat      Prior Function Level of Independence: Needs assistance   Gait / Transfers Assistance Needed: Pt primarily uses w/c but has been able to transfer to w/c with supervision of staff  ADL's / Homemaking Assistance Needed: staff provdes 50% of assist with ADLs, mostly LB        Hand Dominance   Dominant Hand: Right    Extremity/Trunk Assessment   Upper Extremity Assessment: Generalized weakness           Lower Extremity Assessment: Generalized weakness         Communication   Communication: No difficulties  Cognition Arousal/Alertness: Awake/alert Behavior During Therapy: WFL for tasks assessed/performed Overall Cognitive Status: Within Functional Limits for tasks assessed  General Comments      Exercises        Assessment/Plan    PT Assessment Patient needs continued PT services  PT Diagnosis Difficulty walking;Generalized weakness   PT Problem List Decreased strength;Decreased activity tolerance;Decreased balance;Decreased mobility;Obesity  PT Treatment Interventions DME instruction;Functional mobility training;Therapeutic activities;Therapeutic exercise;Balance training;Patient/family education   PT Goals (Current goals can be found in the Care Plan section) Acute Rehab PT Goals Patient Stated Goal[facility] PT Goal Formulation: With patient/family Time For Goal Achievement:  04/03/14 Potential to Achieve Goals: Fair    Frequency Min 3X/week   Barriers to discharge        Co-evaluation               End of Session   Activity Tolerance: Patient limited by fatigue Patient left: in bed;with call bell/phone within reach;with family/visitor present Nurse Communication: Mobility status         Time: 1200-1216 PT Time Calculation (min): 16 min   Charges:   PT Evaluation $Initial PT Evaluation Tier I: 1 Procedure PT Treatments $Gait Training: 8-22 mins   PT G CodesAngelina Jacobson:          [name] 03/27/2014, 12:49 PM  Fluor CorporationCary Eldene Plocher PT [phone]

## 2014-03-28 ENCOUNTER — Inpatient Hospital Stay (HOSPITAL_COMMUNITY): Payer: Medicare Other

## 2014-03-28 LAB — COMPREHENSIVE METABOLIC PANEL
ALK PHOS: 96 U/L (ref 39–117)
ALT: 67 U/L — ABNORMAL HIGH (ref 0–35)
AST: 188 U/L — AB (ref 0–37)
Albumin: 2.5 g/dL — ABNORMAL LOW (ref 3.5–5.2)
BUN: 83 mg/dL — ABNORMAL HIGH (ref 6–23)
CALCIUM: 8.5 mg/dL (ref 8.4–10.5)
CO2: 16 meq/L — AB (ref 19–32)
Chloride: 95 mEq/L — ABNORMAL LOW (ref 96–112)
Creatinine, Ser: 1.7 mg/dL — ABNORMAL HIGH (ref 0.50–1.10)
GFR calc Af Amer: 35 mL/min — ABNORMAL LOW (ref >90)
GFR calc non Af Amer: 30 mL/min — ABNORMAL LOW (ref >90)
Glucose, Bld: 90 mg/dL (ref 70–99)
POTASSIUM: 4.5 meq/L (ref 3.7–5.3)
SODIUM: 126 meq/L — AB (ref 137–147)
Total Bilirubin: 2 mg/dL — ABNORMAL HIGH (ref 0.3–1.2)
Total Protein: 5.1 g/dL — ABNORMAL LOW (ref 6.0–8.3)

## 2014-03-28 LAB — CBC
HEMATOCRIT: 31.8 % — AB (ref 36.0–46.0)
HEMOGLOBIN: 11 g/dL — AB (ref 12.0–15.0)
MCH: 31.3 pg (ref 26.0–34.0)
MCHC: 34.6 g/dL (ref 30.0–36.0)
MCV: 90.6 fL (ref 78.0–100.0)
Platelets: 81 10*3/uL — ABNORMAL LOW (ref 150–400)
RBC: 3.51 MIL/uL — ABNORMAL LOW (ref 3.87–5.11)
RDW: 16.4 % — AB (ref 11.5–15.5)
WBC: 7 10*3/uL (ref 4.0–10.5)

## 2014-03-28 LAB — MAGNESIUM: Magnesium: 2.5 mg/dL (ref 1.5–2.5)

## 2014-03-28 LAB — PHOSPHORUS: Phosphorus: 3.3 mg/dL (ref 2.3–4.6)

## 2014-03-28 MED ORDER — METHOCARBAMOL 500 MG PO TABS: 500.0000 mg | ORAL_TABLET | Freq: Two times a day (BID) | ORAL | Status: AC | PRN

## 2014-03-28 MED ORDER — ACETAMINOPHEN 325 MG PO TABS: ORAL_TABLET | ORAL | Status: AC

## 2014-03-28 MED ORDER — BOOST / RESOURCE BREEZE PO LIQD: 1.0000 | Freq: Three times a day (TID) | ORAL | Status: AC

## 2014-03-28 NOTE — Discharge Summary (Addendum)
Physician Discharge Summary  DISCHARGE SUMMARY   Patient ID: [name] MR#: [id number] DOB/AGE: [id number] 68 y.o.   Attending Physician:Kiowa Peifer Eloise Harman  Patient's NWG:NFAOZ,HYQM Judie Petit, MD  Consults:Treatment Team:  Florencia Reasons, MD**  Admit date: 03/24/2014 Discharge date: 03/29/2014  Discharge Diagnoses:  Active Problems:   Exogenous obesity   Anxiety and depression   Cirrhosis   Ascites   Azotemia   Unspecified protein-calorie malnutrition   Hyponatremia   Hyperkalemia   Patient Active Problem List   Diagnosis Date Noted  . Unspecified protein-calorie malnutrition 03/25/2014  . Hyponatremia 03/25/2014  . Hyperkalemia 03/25/2014  . Azotemia 03/24/2014  . Cirrhosis 09/19/2013  . Ascites 09/19/2013  . Shortness of breath 09/19/2013  . GI bleed 04/22/2013  . Anemia 04/21/2013  . Falls frequently 04/21/2013  . CVA (cerebral infarction) 04/21/2013  . Exogenous obesity   . Anxiety and depression   . Hiatal hernia   . Osteoarthritis   . Atypical chest pain   . Memory changes   . Bipolar disorder   . Benign hypertensive heart disease without heart failure 11/09/2011  . Chest pain 11/09/2011  . Anxiety 11/09/2011   Past Medical History  Diagnosis Date  . Exogenous obesity   . Hypertension   . Anxiety and depression     chronic  . Hiatal hernia   . Atypical chest pain 06/10/03    normal 2 day adenosine cardiolite  . Memory changes   . Bipolar disorder   . Ascites   . Cirrhosis   . Anginal pain   . Exertional shortness of breath   . Shortness of breath     "all the time right now" (09/19/2013)  . Anemia   . History of blood transfusion 05/2013    "once; related to a stomach ulcer" (09/19/2013)  . Bleeding stomach ulcer 04/22/2013  . GERD (gastroesophageal reflux disease)   . Osteoarthritis   . Arthritis     "all over my body" (09/19/2013)  . Anxiety   . Depression   . Fatty liver disease, nonalcoholic   . CVA (cerebral infarction) 04/21/13     CCT c/w Left cerebellar infarct which appears subacute to remote    Discharged Condition: She continues to have early satiety with small volume meals, generalized weakness, no dypsnea, had BP systolic over 100 when sitting upright on day of discharge.   Discharge Medications:   Medication List    STOP taking these medications       furosemide 40 MG tablet  Commonly known as:  LASIX      TAKE these medications       acetaminophen 325 MG tablet  Commonly known as:  TYLENOL  Take 1 tablet (325 mg) by mouth 2 every 6 hours as needed for pain     albuterol (2.5 MG/3ML) 0.083% nebulizer solution  Commonly known as:  PROVENTIL  Take 2.5 mg by nebulization 4 (four) times daily as needed for wheezing.     busPIRone 5 MG tablet  Commonly known as:  BUSPAR  Take 5 mg by mouth at bedtime.     CERTAVITE/ANTIOXIDANTS Tabs  Take 1 tablet by mouth daily. Take on an empty stomach (10am)     docusate sodium 100 MG capsule  Commonly known as:  COLACE  Take 100 mg by mouth daily as needed (constipation).     doxepin 50 MG capsule  Commonly known as:  SINEQUAN  Take 50 mg by mouth at bedtime.     ENULOSE 10 GM/15ML  Soln  Generic drug:  lactulose (encephalopathy)  Take 20 g by mouth 3 (three) times a week. Every Tuesday, Thursday and Saturday (watch for diarrhea)     feeding supplement (PRO-STAT SUGAR FREE 64) Liqd  Take 30 mLs by mouth See admin instructions. Take 30 mls by mouth every day after breakfast and offer a dose after dinner     feeding supplement (RESOURCE BREEZE) Liqd  Take 1 Container by mouth 3 (three) times daily with meals.     Melatonin 3 MG Tabs  Take 3 mg by mouth at bedtime.     methocarbamol 500 MG tablet  Commonly known as:  ROBAXIN  Take 1 tablet (500 mg total) by mouth every 12 (twelve) hours as needed for muscle spasms (and 1 hr prior to Paracentesis).     metoCLOPramide 5 MG tablet  Commonly known as:  REGLAN  Take 5 mg by mouth 2 (two) times daily  with breakfast and lunch.     nitroGLYCERIN 0.4 MG SL tablet  Commonly known as:  NITROSTAT  Place 0.4 mg under the tongue every 5 (five) minutes as needed for chest pain.     ondansetron 4 MG tablet  Commonly known as:  ZOFRAN  Take 4 mg by mouth every 6 (six) hours as needed for nausea or vomiting.     pantoprazole 40 MG tablet  Commonly known as:  PROTONIX  Take 40 mg by mouth 2 (two) times daily.     polyethylene glycol packet  Commonly known as:  MIRALAX / GLYCOLAX  Take 17 g by mouth daily as needed (constipation). Mix with 8 oz of fluid and drink        Hospital Procedures: Dg Chest 2 View  03/02/2014   CLINICAL DATA:  Chest pressure and shortness of breath. Left-sided chest pain.  EXAM: CHEST  2 VIEW  COMPARISON:  10/11/2013  FINDINGS: The cardiac silhouette remains mildly enlarged. Thoracic aortic calcification is noted. There is mild persistent retrocardiac opacity in the left lower lobe, improved from the prior study. The right lung is clear. No definite pleural effusion or pneumothorax is identified. No acute osseous abnormality is identified.  IMPRESSION: Mild left lower lobe opacity, less than on the prior study. This could reflect atelectasis or infectious infiltrate.   Electronically Signed   By: Sebastian Ache   On: 03/02/2014 20:08   Ct Angio Chest Pe W/cm &/or Wo Cm  03/03/2014   CLINICAL DATA:  Severe left chest pain and shortness of breath today.  EXAM: CT ANGIOGRAPHY CHEST WITH CONTRAST  TECHNIQUE: Multidetector CT imaging of the chest was performed using the standard protocol during bolus administration of intravenous contrast. Multiplanar CT image reconstructions and MIPs were obtained to evaluate the vascular anatomy.  CONTRAST:  70mL OMNIPAQUE IOHEXOL 350 MG/ML SOLN  COMPARISON:  DG CHEST 2 VIEW dated 03/02/2014  FINDINGS: Suboptimal bolus timing, this decreases sensitivity for segmental to subsegmental pulmonary arterial filling defects. No central pulmonary arterial  filling defects. Main pulmonary artery is not enlarged.  Heart size is normal, no right heart strain. Thoracic aorta is normal in course and caliber with mild calcific atherosclerosis. Pericardium is unremarkable.  Small hiatal hernia with associated passive atelectasis, no pleural effusions or focal consolidations. Tracheobronchial tree is patent, no pneumothorax.  Included view of the abdomen demonstrates at least moderate amount of ascites, which tracks along the hiatal hernia. Partially imaged cirrhosis.  Severe degenerative change of the included cervical spine and right shoulder, to lesser extent left  shoulder.  Review of the MIP images confirms the above findings.  IMPRESSION: Suboptimal bolus timing without central pulmonary arterial filling defect nor acute cardiopulmonary process.  At least moderate ascites, partially imaged, tracking along a small hiatal hernia, which could account for the left lung base opacity seen on prior chest radiograph. Partially imaged cirrhosis.   Electronically Signed   By: Awilda Metroourtnay  Bloomer   On: 03/03/2014 00:00   [place] Paracentesis  03/27/2014   CLINICAL DATA:  Cirrhosis, recurrent ascites, renal failure. Request is made for therapeutic paracentesis up to 5 liters.  EXAM: ULTRASOUND GUIDED THERAPEUTIC PARACENTESIS  COMPARISON:  PRIOR PARACENTESIS ON 03/25/2014  PROCEDURE: An ultrasound guided paracentesis was thoroughly discussed with the patient and questions answered. The benefits, risks, alternatives and complications were also discussed. The patient understands and wishes to proceed with the procedure. Written consent was obtained.  Ultrasound was performed to localize and mark an adequate pocket of fluid in the left lower quadrant of the abdomen. The area was then prepped and draped in the normal sterile fashion. 1% Lidocaine was used for local anesthesia. Under ultrasound guidance a 19 gauge Yueh catheter was introduced. Paracentesis was performed. The catheter was  removed and a dressing applied.  Complications: None.  FINDINGS: A total of approximately 5 liters of amber fluid was removed.  IMPRESSION: Successful ultrasound guided therapeutic paracentesis yielding 5 liters of ascites.  Read by: Jeananne RamaKevin Allred ,P.A.-C.   Electronically Signed   By: Oley Balmaniel  Hassell M.D.   On: 03/27/2014 11:18   [place] Paracentesis  03/25/2014   CLINICAL DATA:  Recurrent ascites. Request therapeutic paracentesis.  EXAM: ULTRASOUND GUIDED PARACENTESIS  COMPARISON:  Previous paracentesis  PROCEDURE: An ultrasound guided paracentesis was thoroughly discussed with the patient and questions answered. The benefits, risks, alternatives and complications were also discussed. The patient understands and wishes to proceed with the procedure. Written consent was obtained.  Ultrasound was performed to localize and mark an adequate pocket of fluid in the left lower quadrant of the abdomen. The area was then prepped and draped in the normal sterile fashion. 1% Lidocaine was used for local anesthesia. Under ultrasound guidance a 19 gauge Yueh catheter was introduced. Paracentesis was performed. The catheter was removed and a dressing applied.  COMPLICATIONS: None immediate  FINDINGS: A total of approximately 7.7 L of clear yellow fluid was removed. A fluid sample was not sent for laboratory analysis.  IMPRESSION: Successful ultrasound guided paracentesis yielding 7.7 L of ascites.  Read by: Brayton ElKevin Bruning PA-C   Electronically Signed   By: Richarda OverlieAdam  Henn M.D.   On: 03/25/2014 12:21   [place] Paracentesis  03/14/2014   CLINICAL DATA:  Cirrhosis, recurrent ascites, request for paracentesis.  EXAM: ULTRASOUND GUIDED therapeutic PARACENTESIS  COMPARISON:  None.  PROCEDURE: An ultrasound guided paracentesis was thoroughly discussed with the patient and questions answered. The benefits, risks, alternatives and complications were also discussed. The patient understands and wishes to proceed with the procedure. Written consent  was obtained.  Ultrasound was performed to localize and mark an adequate pocket of fluid in the left middle to lower quadrant of the abdomen. The area was then prepped and draped in the normal sterile fashion. 1% Lidocaine was used for local anesthesia. Under ultrasound guidance a 19 gauge Yueh catheter was introduced. Paracentesis was performed. The catheter was removed and a dressing applied.  Complications: None.  FINDINGS: A total of approximately 8.5 liters of yellow fluid was removed. A fluid sample was not sent for laboratory analysis.  IMPRESSION: Successful ultrasound guided paracentesis yielding 8.5 liters of ascites.  Read By:  Pattricia BossKoreen Morgan PA-C   Electronically Signed   By: Ruel Favorsrevor  Shick M.D.   On: 03/14/2014 15:09   [place] Paracentesis  03/04/2014   CLINICAL DATA:  Cirrhosis, ascites, request for paracentesis.  EXAM: ULTRASOUND GUIDED THERAPEUTIC PARACENTESIS  COMPARISON:  None.  PROCEDURE: An ultrasound guided paracentesis was thoroughly discussed with the patient and questions answered. The benefits, risks, alternatives and complications were also discussed. The patient understands and wishes to proceed with the procedure. Written consent was obtained.  Ultrasound was performed to localize and mark an adequate pocket of fluid in the left upper quadrant of the abdomen. The area was then prepped and draped in the normal sterile fashion. 1% Lidocaine was used for local anesthesia. Under ultrasound guidance a 19 gauge Yueh catheter was introduced. Paracentesis was performed. The catheter was removed and a dressing applied.  Complications: None.  FINDINGS: A total of approximately 8.6 liters of yellow fluid was removed. A fluid sample was not sent for laboratory analysis.  IMPRESSION: Successful ultrasound guided paracentesis yielding 8.6 liters of ascites.  Read By:  Pattricia BossKoreen Morgan PA-C   Electronically Signed   By: Ruel Favorsrevor  Shick M.D.   On: 03/04/2014 15:25   Dg Chest Port 1 View  03/25/2014   CLINICAL  DATA:  Shortness of breath, history hypertension  EXAM: PORTABLE CHEST - 1 VIEW  COMPARISON:  Portable exam 0721 hr compared to 03/02/2014  FINDINGS: Upper normal size of cardiac silhouette.  Atherosclerotic calcification aorta.  Moderate-sized hiatal hernia.  Pulmonary vascularity normal.  No definite infiltrate, pleural effusion or pneumothorax.  Minimal atelectasis left base.  IMPRESSION: Minimal left base atelectasis.  Moderate-sized hiatal hernia.   Electronically Signed   By: Ulyses SouthwardMark  Boles M.D.   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 Appreciate Dr Buccini's help who saw her twice in house. She had Albumin infusion post each procedure and it seemed to help. She has proven that she cannot handle the diuretics at this point and they are off. IVF was initially provided and is also off. She is DNR. She is clearly worsening and may be progressing towards Hepato-renal and hospice palliative care but can delay this discussion for a few more weeks/months?. We have discussed Pleur-X indwelling Catheter for comfort and progressing towards a Hospice Palliative care approach only. She wants me to attempt one more time to restore some quality and see if I can get her stabilized - the good news is her parameters are a little better and she looks better after the two paracentesis and stopping her aldactone/lasix and Beta Blocker (a few weeks ago). She will probably need weekly Paracentesis but i will see her tues or thurs next week and decide when next one will happen.  Ammonia 64 and on low dose lactulose. We discussed what Dr B  recommended - once per week Paracentesis   Azotemia/Renal Failure and electrolyte issues - mod improvement - follow. Espect more improvement as outpt.  Cr down to 1.70 from a max of 2.16 Na up to 126 from 120.  She needs 1.5 Liters per day of free water restriction. Her Potassium was > 6 on admission and it is down to 4.5 and stable.   Progressive weakness, AFTT, and confusion. PT/OT/SW consulted. She did well with PT/OT.  Their only recs were HHPT, and approp care.  Will set up HHPT   Protein Cal Malnutrition/Anorexia and chronic Nausea - Nutrition consulted and plan was:  Resource Breeze (peach) TID with meals, each supplement providing 250 kcal and 9 grams of protein  Continue Pro-stat BID  Continue Multivitamin with minerals daily  Pt may benefit from additional supplements: may need water soluble forms of vitamin A, vitamin D, vitamin E, vitamin K  Monitor magnesium, potassium, and phosphorus daily for at least 3 days or throughout nutrition repletion, MD to replete as needed, as pt is at risk for refeeding syndrome given severe malnutrition.  Daily weights. Zofran/Reglan. Calorie supplements. Vits.   Hyponatremia - continue free water restrict. If Kidney function improves the Na should also get better  Hyperkalemia - K better. No ekg issues noted HTN - BP surprisingly stable but lowish - off all meds.  BPs in 80-100 range.  She is likely to get orthostasis but cannot do much to improve this.  Monitor.  Anxiety/Depression - Prozac is off. Continue Doxepin. She remains on Buspar.   SOB/DOE due to her fluid status/Vol Overload is much better.  I took off FIO2 and sats 98%. CXR without surprises. Use the Incentive spirometer  Plan reviewed c pt and daughter.  DVT Prophylaxis - SCDs in house Dispo - plan back to her AL today.  Seen today on am rounds.  Mentally she is at baseline.  She is smiling and joking.  Her ab is soft and she looks good for her.      Day of Discharge Exam BP  88/58  Pulse 93  Temp(Src) 97 F (36.1 C) (Oral)  Resp 18  Ht 5\' 6"  (1.676 m)  Wt 107.321 kg (236 lb 9.6 oz)  BMI 38.21 kg/m2  SpO2 94%  Physical Exam: General appearance: older than stated age.  Eyes: no scleral icterus  Throat: oropharynx was somewhat dry without exudate Resp: Decreased at bases.  Cardio: Reg  GI: Less Distended, Softer, Obese, Less Ascites.  Extremities: Less edema. L leg/shin bruise better. She was alert and well oriented and able to move all extremities   Discharge Labs:  Recent Labs  03/27/14 0350 03/28/14 0657 03/29/14 0529  NA 124* 126* 126*  K 4.6 4.5 4.6  CL 90* 95* 92*  CO2 16* 16* 19  GLUCOSE 82 90 91  BUN 82* 83* 86*  CREATININE 1.89* 1.70* 1.70*  CALCIUM 8.3* 8.5 8.7  MG 2.4 2.5  --   PHOS 3.0 3.3  --     Recent Labs  03/28/14 0657 03/29/14 0529  AST 188* 184*  ALT 67* 72*  ALKPHOS 96 96  BILITOT 2.0* 3.1*  PROT 5.1* 5.3*  ALBUMIN 2.5* 2.5*    Recent Labs  03/28/14 0657 03/29/14 0529  WBC 7.0 10.6*  HGB 11.0* 11.8*  HCT 31.8* 34.5*  MCV 90.6 89.4  PLT 81* 97*   No results found for this basename: CKTOTAL, CKMB, CKMBINDEX, TROPONINI,  in the last 72 hours No results found for this basename: TSH, T4TOTAL, FREET3, T3FREE, THYROIDAB,  in the last 72 hours No results found for this basename: VITAMINB12, FOLATE, FERRITIN, TIBC, IRON, RETICCTPCT,  in the last 72 hours Lab Results  Component Value Date   INR  1.44 03/24/2014   INR 1.42 03/02/2014   INR 1.38 02/14/2014       Discharge instructions: Discharge Orders   Future Orders Complete By Expires   Call MD for:  As directed    Diet - low sodium heart healthy  As directed    Discharge instructions  As directed    Increase activity slowly  As directed      01-Home or Self Care Follow-up Information   Follow up with Gwen Pounds, MD In 1 week.   Specialty:  Internal Medicine   Contact information:   [address] [facility] [place] [address] [phone]       Follow up with Gwen Pounds, MD. Schedule an appointment as soon as possible for a visit in 1 week.   Specialty:  Internal Medicine   Contact information:   [address] [facility] [place] [address] [phone]        Disposition: home  Follow-up Appts: Follow-up with Dr. Timothy Lasso at [facility] in 1 week.  Call for appointment.  Condition on Discharge: better  Tests Needing Follow-up: Labs.  Paracentesis.  Time spent in discharge (includes decision making & examination of pt): 40 min  Signed: Jarome Matin 03/29/2014, 11:52 AM

## 2014-03-28 NOTE — Progress Notes (Signed)
Patient given zofran PO, vomitting for the third time. Will continue to monitor.

## 2014-03-28 NOTE — Care Management Note (Signed)
    Page 1 of 1   03/28/2014     4:10:37 PM CARE MANAGEMENT NOTE 03/28/2014  Patient:  [name],[name]   Account Number:  [id number]  Date Initiated:  03/28/2014  Documentation initiated by:  Letha CapeAYLOR,Clinton Wahlberg  Subjective/Objective Assessment:   dx liver failure, azotemia  admit- from [facility].     Action/Plan:   Anticipated DC Date:  03/28/2014   Anticipated DC Plan:  HOME W HOME HEALTH SERVICES  In-house referral  Clinical Social Worker      DC Planning Services  CM consult      [facility]   Choice offered to / List presented to:          HH arranged  HH-2 PT  HH-3 OT      HH agency  OTHER - SEE NOTE   Status of service:  Completed, signed off Medicare Important Message given?   (If response is "NO", the following Medicare IM given date fields will be blank) Date Medicare IM given:   Date Additional Medicare IM given:    Discharge Disposition:  HOME W HOME HEALTH SERVICES  Per UR Regulation:  Reviewed for med. necessity/level of care/duration of stay  If discussed at Long Length of Stay Meetings, dates discussed:    Comments:  03/28/14 1607 Letha Capeeborah Lorean Ekstrand RN, BSN [phone] [id number] NCM [facility] , she states they will set up the hhpt/ot for patient , they just need it to be on the fl2 and they need orders in the chart. Informed CSW of this information.

## 2014-03-28 NOTE — Progress Notes (Signed)
Occupational Therapy Treatment Patient Details Name: [name] MRN: [id number] DOB: [birth year] Today's Date: 03/28/2014    History of present illness [name] is a 68 y.o. female with [name] cirrhosis admitted to the [facility] by her primary physician because of refractory ascites. Pt requiring multiple paracentesises.   OT comments  Pt required max encouragement to fully participate in session. Pt very anxious and stating " I'm not able " without even attempting any mobility/activity. [name] [name] present this session. Pt finally agreeable to sit EOB, but refused sit - stand/transfers multiple times. Pt requires max A for bed mobility to sit EOB and min guard A for balance/support at EOB during functional tasks. [name] also trying to encourage pt to participate more. At end of session. [name] discussed pt's lack of activity/motivaton with therapist in hallway and ALF staff coming in to speak with pt and her daughter    Follow Up Recommendations    SNF;Supervision/Assistance - 24 hour;Home health OT. PT/family adamant about returining to ALF   Equipment Recommendations  None recommended by OT    Recommendations for Other Services      Precautions / Restrictions Precautions Precautions: Fall Restrictions Weight Bearing Restrictions: No       Mobility Bed Mobility Overal bed mobility: Needs Assistance Bed Mobility: Supine to Sit;Sit to Supine     Supine to sit: Max assist Sit to supine: Max assist   General bed mobility comments: total A + 2 to [facility] to [facility] pads  Transfers                 General transfer comment: pt refused sit - stand/transfers    Balance   Sitting-balance support: Bilateral upper extremity supported;Feet supported Sitting balance-Leahy Scale: Fair                             ADL       Grooming: Wash/dry hands;Wash/dry face;Oral care;Min guard;Set up Grooming Details (indicate cue  type and reason): min guard A for balance/support Upper Body Bathing: Minimal assitance;Sitting Upper Body Bathing Details (indicate cue type and reason): min A - min guard A for balance/support Lower Body Bathing: Maximal assistance;Sitting/lateral leans;Moderate assistance Lower Body Bathing Details (indicate cue type and reason): min guard A for balance/support Upper Body Dressing : Minimal assistance;Sitting Upper Body Dressing Details (indicate cue type and reason): min guard A for balance/support       Toilet Transfer Details (indicate cue type and reason): Pt refused sit - stand/transfers           General ADL Comments: Pt very anxious and required max encouragement to fully participate,increased time to complete functional tasks                  Cognition   Behavior During Therapy: Anxious Overall Cognitive Status: Within Functional Limits for tasks assessed                                                  General Comments  Pt anxious and not very motivated    Pertinent Vitals/ Pain       5/10 B knee pain with mobility, VSS  Frequency Min 2X/week     Progress Toward Goals  OT Goals(current goals can now be found in the care plan section)  Progress towards OT goals: OT to reassess next treatment     Plan Discharge plan remains appropriate                     End of Session     Activity Tolerance Patient limited by fatigue   Patient Left in bed;with family/visitor present;with call bell/phone within reach             Time: 1025-1110 OT Time Calculation (min): 45 min  Charges: OT Evaluation $Initial OT Evaluation Tier I: 1 Procedure OT Treatments $Self Care/Home Management : 8-22 mins $Therapeutic Activity: 23-37 mins  [name] 03/28/2014, 1:22 PM

## 2014-03-28 NOTE — Progress Notes (Signed)
Patient vomited at 1220 and again at 1240. Given Zofran. VS stable.

## 2014-03-28 NOTE — Discharge Instructions (Signed)
Ascites °Ascites is a gathering of fluid in the belly (abdomen). This is most often caused by liver disease. It may also be caused by a number of other less common problems. It causes a ballooning out (distension) of the abdomen. °CAUSES  °Scarring of the liver (cirrhosis) is the most common cause of ascites. Other causes include: °· Infection or inflammation in the abdomen. °· Cancer in the abdomen. °· Heart failure. °· Certain forms of kidney failure (nephritic syndrome). °· Inflammation of the pancreas. °· Clots in the veins of the liver. °SYMPTOMS  °In the early stages of ascites, you may not have any symptoms. The main symptom of ascites is a sense of abdominal bloating. This is due to the presence of fluid. This may also cause an increase in abdominal or waist size. People with this condition can develop swelling in the legs, and men can develop a swollen scrotum. When there is a lot of fluid, it may be hard to breath. Stretching of the abdomen by fluid can be painful. °DIAGNOSIS  °Certain features of your medical history, such as a history of liver disease and of an enlarging abdomen, can suggest the presence of ascites. The diagnosis of ascites can be made on physical exam by your caregiver. An abdominal ultrasound examination can confirm that ascites is present, and estimate the amount of fluid. °Once ascites is confirmed, it is important to determine its cause. Again, a history of one of the conditions listed in "CAUSES" provides a strong clue. A physical exam is important, and blood and X-ray tests may be needed. During a procedure called paracentesis, a sample of fluid is removed from the abdomen. This can determine certain key features about the fluid, such as whether or not infection or cancer is present. Your caregiver will determine if a paracentesis is necessary. They will describe the procedure to you. °PREVENTION  °Ascites is a complication of other conditions. Therefore to prevent ascites, you  must seek treatment for any significant health conditions you have. Once ascites is present, careful attention to fluid and salt intake may help prevent it from getting worse. If you have ascites, you should not drink alcohol. °PROGNOSIS  °The prognosis of ascites depends on the underlying disease. If the disease is reversible, such as with certain infections or with heart failure, then ascites may improve or disappear. When ascites is caused by cirrhosis, then it indicates that the liver disease has worsened, and further evaluation and treatment of the liver disease is needed. If your ascites is caused by cancer, then the success or failure of the cancer treatment will determine whether your ascites will improve or worsen. °RISKS AND COMPLICATIONS  °Ascites is likely to worsen if it is not properly diagnosed and treated. A large amount of ascites can cause pain and difficulty breathing. The main complication, besides worsening, is infection (called spontaneous bacterial peritonitis). This requires prompt treatment. °TREATMENT  °The treatment of ascites depends on its cause. When liver disease is your cause, medical management using water pills (diuretics) and decreasing salt intake is often effective. Ascites due to peritoneal inflammation or malignancy (cancer) alone does not respond to salt restriction and diuretics. Hospitalization is sometimes required. °If the treatment of ascites cannot be managed with medications, a number of other treatments are available. Your caregivers will help you decide which will work best for you. Some of these are: °· Removal of fluid from the abdomen (paracentesis). °· Fluid from the abdomen is passed into a vein (peritoneovenous shunting). °·   Liver transplantation. °· Transjugular intrahepatic portosystemic stent shunt. °HOME CARE INSTRUCTIONS  °It is important to monitor body weight and the intake and output of fluids. Weigh yourself at the same time every day. Record your  weights. Fluid restriction may be necessary. It is also important to know your salt intake. The more salt you take in, the more fluid you will retain. Ninety percent of people with ascites respond to this approach. °· Follow any directions for medicines carefully. °· Follow up with your caregiver, as directed. °· Report any changes in your health, especially any new or worsening symptoms. °· If your ascites is from liver disease, avoid alcohol and other substances toxic to the liver. °SEEK MEDICAL CARE IF:  °· Your weight increases more than a few pounds in a few days. °· Your abdominal or waist size increases. °· You develop swelling in your legs. °· You had swelling and it worsens. °SEEK IMMEDIATE MEDICAL CARE IF:  °· You develop a fever. °· You develop new abdominal pain. °· You develop difficulty breathing. °· You develop confusion. °· You have bleeding from the mouth, stomach, or rectum. °MAKE SURE YOU:  °· Understand these instructions. °· Will watch your condition. °· Will get help right away if you are not doing well or get worse. °Document Released: 11/28/2005 Document Revised: 02/20/2012 Document Reviewed: 06/29/2007 °[employer]® Patient Information ©2014 [employer]. ° °

## 2014-03-28 NOTE — Clinical Social Work Note (Signed)
Patient not discharging today per RN. Per ALF, patient can come to facility tomorrow as long as she arrives before 1:00PM.  Roddie McBryant Jerolene Kupfer MSW, [place]CS[place], [place], [phone]

## 2014-03-28 NOTE — Progress Notes (Signed)
We kept pt today due to N/V and Orthostasis. BP fine. KUB ordered. Should be able to d/c to ALF tomorrow if remains stable.

## 2014-03-29 LAB — CBC
HCT: 34.5 % — ABNORMAL LOW (ref 36.0–46.0)
Hemoglobin: 11.8 g/dL — ABNORMAL LOW (ref 12.0–15.0)
MCH: 30.6 pg (ref 26.0–34.0)
MCHC: 34.2 g/dL (ref 30.0–36.0)
MCV: 89.4 fL (ref 78.0–100.0)
Platelets: 97 10*3/uL — ABNORMAL LOW (ref 150–400)
RBC: 3.86 MIL/uL — AB (ref 3.87–5.11)
RDW: 16.4 % — ABNORMAL HIGH (ref 11.5–15.5)
WBC: 10.6 10*3/uL — AB (ref 4.0–10.5)

## 2014-03-29 LAB — COMPREHENSIVE METABOLIC PANEL
ALT: 72 U/L — ABNORMAL HIGH (ref 0–35)
AST: 184 U/L — ABNORMAL HIGH (ref 0–37)
Albumin: 2.5 g/dL — ABNORMAL LOW (ref 3.5–5.2)
Alkaline Phosphatase: 96 U/L (ref 39–117)
BILIRUBIN TOTAL: 3.1 mg/dL — AB (ref 0.3–1.2)
BUN: 86 mg/dL — ABNORMAL HIGH (ref 6–23)
CHLORIDE: 92 meq/L — AB (ref 96–112)
CO2: 19 meq/L (ref 19–32)
CREATININE: 1.7 mg/dL — AB (ref 0.50–1.10)
Calcium: 8.7 mg/dL (ref 8.4–10.5)
GFR calc Af Amer: 35 mL/min — ABNORMAL LOW (ref >90)
GFR, EST NON AFRICAN AMERICAN: 30 mL/min — AB (ref >90)
Glucose, Bld: 91 mg/dL (ref 70–99)
Potassium: 4.6 mEq/L (ref 3.7–5.3)
Sodium: 126 mEq/L — ABNORMAL LOW (ref 137–147)
Total Protein: 5.3 g/dL — ABNORMAL LOW (ref 6.0–8.3)

## 2014-03-29 MED ORDER — METOCLOPRAMIDE HCL 5 MG PO TABS
5.0000 mg | ORAL_TABLET | Freq: Three times a day (TID) | ORAL | Status: DC
  Administered 2014-03-29: 5 mg via ORAL
  Filled 2014-03-29 (×3): qty 1

## 2014-03-29 MED ORDER — SODIUM CHLORIDE 0.9 % IV SOLN
INTRAVENOUS | Status: DC
  Administered 2014-03-29: 11:00:00 via INTRAVENOUS

## 2014-03-29 NOTE — Progress Notes (Signed)
Pt aware of transportation to facility, IV and tele removed, daughter made aware of transportation Archie Balboalivia G Stein, RN

## 2014-03-29 NOTE — Progress Notes (Signed)
NT called RN in the room to look at the redden areas between patient's thighs and groin. Patient stated that it was hurting her because her thighs had been rubbing together for a while. RN ensured patient that barrier cream would be applied. Will continue to monitor

## 2014-03-29 NOTE — Progress Notes (Signed)
CARE MANAGEMENT NOTE 03/29/2014  Patient:  [name],[name]   Account Number:  [id number]  Date Initiated:  03/28/2014  Documentation initiated by:  Letha CapeAYLOR,DEBORAH  Subjective/Objective Assessment:   dx liver failure, azotemia  admit- from [facility].     Action/Plan:   Anticipated DC Date:  03/28/2014   Anticipated DC Plan:  HOME W HOME HEALTH SERVICES  In-house referral  Clinical Social Worker      DC Planning Services  CM consult      [facility]   Choice offered to / List presented to:          HH arranged  HH-2 PT  HH-3 OT      HH agency  OTHER - SEE NOTE   Status of service:  Completed, signed off Medicare Important Message given?   (If response is "NO", the following Medicare IM given date fields will be blank) Date Medicare IM given:   Date Additional Medicare IM given:    Discharge Disposition:  HOME W HOME HEALTH SERVICES  Per UR Regulation:  Reviewed for med. necessity/level of care/duration of stay  If discussed at Long Length of Stay Meetings, dates discussed:    Comments:  03/29/14 12:15 CM on untit when pt chart being assembled for transport with pt back to [facility]; CM printed off HHPT/OT orders to be included in information going back with pt.  CSW to be called for ambulance transport.  No other CM needs were communicated.  Freddy JakschSarah Gabbi Whetstone, BSN, [place] [phone].  03/28/14 1607 Letha Capeeborah Taylor RN, BSN [phone] NCM [facility] , she states they will set up the hhpt/ot for patient , they just need it to be on the fl2 and they need orders in the chart. Informed CSW of this information.

## 2014-03-29 NOTE — Progress Notes (Signed)
Supine: BP 96/59 HR 84  Sitting: BP 102/64 HR 84  Archie Balboalivia G Stein, RN

## 2014-03-29 NOTE — Progress Notes (Signed)
Subjective: She continues to be weak with very minimal food and fluid intake, has early satiety Objective: Vital signs in last 24 hours: Temp:  [97 F (36.1 C)-97.8 F (36.6 C)] 97 F (36.1 C) (04/18 0449) Pulse Rate:  [93-96] 93 (04/18 0449) Resp:  [18] 18 (04/18 0449) BP: (88-109)/(58-67) 88/58 mmHg (04/18 0449) SpO2:  [94 %-99 %] 94 % (04/18 0449) Weight:  [107.321 kg (236 lb 9.6 oz)] 107.321 kg (236 lb 9.6 oz) (04/18 0226) Weight change: -2.177 kg (-4 lb 12.8 oz)   Intake/Output from previous day: 04/17 0701 - 04/18 0700 In: 3 [I.V.:3] Out: -    General appearance: alert, cooperative, no distress and appears older than stated age Resp: clear to auscultation bilaterally Cardio: regular rate and rhythm GI: bowel sounds normal, mild LUQ tenderness without rebound Extremities: bilateral 2+ leg edema, oral mucosa is very dry  Lab Results:  Recent Labs  03/28/14 0657 03/29/14 0529  WBC 7.0 10.6*  HGB 11.0* 11.8*  HCT 31.8* 34.5*  PLT 81* 97*   BMET  Recent Labs  03/28/14 0657 03/29/14 0529  NA 126* 126*  K 4.5 4.6  CL 95* 92*  CO2 16* 19  GLUCOSE 90 91  BUN 83* 86*  CREATININE 1.70* 1.70*  CALCIUM 8.5 8.7   CMET CMP     Component Value Date/Time   NA 126* 03/29/2014 0529   K 4.6 03/29/2014 0529   CL 92* 03/29/2014 0529   CO2 19 03/29/2014 0529   GLUCOSE 91 03/29/2014 0529   BUN 86* 03/29/2014 0529   CREATININE 1.70* 03/29/2014 0529   CALCIUM 8.7 03/29/2014 0529   PROT 5.3* 03/29/2014 0529   ALBUMIN 2.5* 03/29/2014 0529   AST 184* 03/29/2014 0529   ALT 72* 03/29/2014 0529   ALKPHOS 96 03/29/2014 0529   BILITOT 3.1* 03/29/2014 0529   GFRNONAA 30* 03/29/2014 0529   GFRAA 35* 03/29/2014 0529    CBG (last 3)  No results found for this basename: GLUCAP,  in the last 72 hours  INR RESULTS:   Lab Results  Component Value Date   INR 1.44 03/24/2014   INR 1.42 03/02/2014   INR 1.38 02/14/2014     Studies/Results: Dg Abd 1 View  03/28/2014   CLINICAL DATA:   Vomiting and diarrhea.  EXAM: ABDOMEN - 1 VIEW  COMPARISON:  None.  FINDINGS: The patient is rotated to the left on today's images.  There is gas within nondilated small bowel and gas and formed stool in the colon. Vascular calcifications noted in the anatomic pelvis. There is evidence of lumbar spondylosis.  IMPRESSION: 1. Unremarkable bowel gas pattern. 2. Lumbar spondylosis.   Electronically Signed   By: Herbie BaltimoreWalt  Liebkemann M.D.   On: 03/28/2014 18:30   [place] Paracentesis  03/27/2014   CLINICAL DATA:  Cirrhosis, recurrent ascites, renal failure. Request is made for therapeutic paracentesis up to 5 liters.  EXAM: ULTRASOUND GUIDED THERAPEUTIC PARACENTESIS  COMPARISON:  PRIOR PARACENTESIS ON 03/25/2014  PROCEDURE: An ultrasound guided paracentesis was thoroughly discussed with the patient and questions answered. The benefits, risks, alternatives and complications were also discussed. The patient understands and wishes to proceed with the procedure. Written consent was obtained.  Ultrasound was performed to localize and mark an adequate pocket of fluid in the left lower quadrant of the abdomen. The area was then prepped and draped in the normal sterile fashion. 1% Lidocaine was used for local anesthesia. Under ultrasound guidance a 19 gauge Yueh catheter was introduced. Paracentesis was performed.  The catheter was removed and a dressing applied.  Complications: None.  FINDINGS: A total of approximately 5 liters of amber fluid was removed.  IMPRESSION: Successful ultrasound guided therapeutic paracentesis yielding 5 liters of ascites.  Read by: Jeananne RamaKevin Allred ,P.A.-C.   Electronically Signed   By: Oley Balmaniel  Hassell M.D.   On: 03/27/2014 11:18    Medications: I have reviewed the patient's current medications.  Assessment/Plan: #1 Ascites, End-Stage Liver Disease:  She appears to be dehydrated, although dry oral mucosa could be from meds. Will check vital signs when sitting up, possible discharge to ALF today if BP  sitting is reasonable. #2 Protein Calorie Malnutrition: severe, will increase dosing of reglan. Consider Megace although risk of DVT is high with this.  #3 Foul-smelling urine: will check urinalysis and urine culture today, no fever to suggest infection.  LOS: 5 days   Jarome MatinDaniel Sadeel Fiddler 03/29/2014, 10:27 AM

## 2014-03-29 NOTE — Clinical Social Work Note (Addendum)
10:30am- CSW informed by RN of patient d/c. RN stated patient would need ambulance to facility. RN informed CSW she was unable to contact patient's daughter and patient was not appropriate to be transferred to facility via car. CSW contacted [facility] and confirmed patient return to facility.  CSW provided patient RN with number for report.   12:14 pm- CSW contacted by patient's daughter who expressed her anger at ambulance transportation being arranged for patient. CSW informed patient's daughter ambulance transportation had not yet been arranged, and there were several unsuccessful attempts to contact her. Patient's daughter stated she could not afford to pay for an ambulance and would transport patient herself to the facility. CSW apologized to patient's daughter for difficulties incurred with d/c planning process and ensured her that keeping the patient's family informed of d/c plan was a priority.   12:30pm- CSW met with patient's daughter and provided d/c packet to take to facility. CSW again acknowledged patient's daughters frustration with d/c planning process. Patient's daughter took packet and left unit with patient in wheelchair. No further needs identified. CSW signing off.   [place], [place] Weekend Clinical Social Worker [phone]

## 2014-03-29 NOTE — Progress Notes (Signed)
Daughter was made aware of ambulance transportation to facility, pts daughter stated that she did not want ambulance transportation and would pick up pt herself, pt was speaking in an angry tone and stated "social workers here are useless" Archie Balboalivia G Stein, RN

## 2014-03-29 NOTE — Progress Notes (Signed)
Attempted to call pt daughter to alert her of discharge, daughter did not answer phone, will try again Archie Balboalivia G Stein, RN

## 2014-03-29 NOTE — Progress Notes (Signed)
Patient's urine this am is foul smelling. Will pass on to day nurse to notify morning MD.

## 2014-04-01 ENCOUNTER — Inpatient Hospital Stay (HOSPITAL_COMMUNITY)
Admission: EM | Admit: 2014-04-01 | Discharge: 2014-04-09 | DRG: 640 | Disposition: A | Discharge status: Nursing Home (Includes ICF) | Payer: Medicare Other | Attending: Internal Medicine | Admitting: Internal Medicine

## 2014-04-01 ENCOUNTER — Encounter (HOSPITAL_COMMUNITY): Payer: Self-pay | Admitting: Emergency Medicine

## 2014-04-01 DIAGNOSIS — E871 Hypo-osmolality and hyponatremia: Principal | ICD-10-CM | POA: Diagnosis present

## 2014-04-01 DIAGNOSIS — N179 Acute kidney failure, unspecified: Secondary | ICD-10-CM | POA: Diagnosis present

## 2014-04-01 DIAGNOSIS — E43 Unspecified severe protein-calorie malnutrition: Secondary | ICD-10-CM | POA: Diagnosis present

## 2014-04-01 DIAGNOSIS — M1990 Unspecified osteoarthritis, unspecified site: Secondary | ICD-10-CM | POA: Diagnosis present

## 2014-04-01 DIAGNOSIS — Z881 Allergy status to other antibiotic agents status: Secondary | ICD-10-CM

## 2014-04-01 DIAGNOSIS — K219 Gastro-esophageal reflux disease without esophagitis: Secondary | ICD-10-CM | POA: Diagnosis present

## 2014-04-01 DIAGNOSIS — K7689 Other specified diseases of liver: Secondary | ICD-10-CM | POA: Diagnosis present

## 2014-04-01 DIAGNOSIS — E875 Hyperkalemia: Secondary | ICD-10-CM | POA: Diagnosis present

## 2014-04-01 DIAGNOSIS — Z6837 Body mass index (BMI) 37.0-37.9, adult: Secondary | ICD-10-CM

## 2014-04-01 DIAGNOSIS — Z8673 Personal history of transient ischemic attack (TIA), and cerebral infarction without residual deficits: Secondary | ICD-10-CM

## 2014-04-01 DIAGNOSIS — F411 Generalized anxiety disorder: Secondary | ICD-10-CM | POA: Diagnosis present

## 2014-04-01 DIAGNOSIS — E162 Hypoglycemia, unspecified: Secondary | ICD-10-CM | POA: Diagnosis not present

## 2014-04-01 DIAGNOSIS — Z66 Do not resuscitate: Secondary | ICD-10-CM | POA: Diagnosis present

## 2014-04-01 DIAGNOSIS — A498 Other bacterial infections of unspecified site: Secondary | ICD-10-CM | POA: Diagnosis present

## 2014-04-01 DIAGNOSIS — E872 Acidosis, unspecified: Secondary | ICD-10-CM | POA: Diagnosis present

## 2014-04-01 DIAGNOSIS — K449 Diaphragmatic hernia without obstruction or gangrene: Secondary | ICD-10-CM | POA: Diagnosis present

## 2014-04-01 DIAGNOSIS — I10 Essential (primary) hypertension: Secondary | ICD-10-CM | POA: Diagnosis present

## 2014-04-01 DIAGNOSIS — R188 Other ascites: Secondary | ICD-10-CM | POA: Diagnosis present

## 2014-04-01 DIAGNOSIS — F319 Bipolar disorder, unspecified: Secondary | ICD-10-CM | POA: Diagnosis present

## 2014-04-01 DIAGNOSIS — E669 Obesity, unspecified: Secondary | ICD-10-CM | POA: Diagnosis present

## 2014-04-01 DIAGNOSIS — Z880 Allergy status to penicillin: Secondary | ICD-10-CM

## 2014-04-01 DIAGNOSIS — K769 Liver disease, unspecified: Secondary | ICD-10-CM | POA: Diagnosis present

## 2014-04-01 DIAGNOSIS — Z515 Encounter for palliative care: Secondary | ICD-10-CM

## 2014-04-01 DIAGNOSIS — Z79899 Other long term (current) drug therapy: Secondary | ICD-10-CM

## 2014-04-01 DIAGNOSIS — N390 Urinary tract infection, site not specified: Secondary | ICD-10-CM | POA: Diagnosis present

## 2014-04-01 DIAGNOSIS — Z6841 Body Mass Index (BMI) 40.0 and over, adult: Secondary | ICD-10-CM

## 2014-04-01 LAB — COMPREHENSIVE METABOLIC PANEL
ALT: 64 U/L — ABNORMAL HIGH (ref 0–35)
AST: 112 U/L — AB (ref 0–37)
Albumin: 2.6 g/dL — ABNORMAL LOW (ref 3.5–5.2)
Alkaline Phosphatase: 117 U/L (ref 39–117)
BUN: 105 mg/dL — ABNORMAL HIGH (ref 6–23)
CO2: 17 meq/L — AB (ref 19–32)
CREATININE: 1.8 mg/dL — AB (ref 0.50–1.10)
Calcium: 9.3 mg/dL (ref 8.4–10.5)
Chloride: 90 mEq/L — ABNORMAL LOW (ref 96–112)
GFR calc Af Amer: 32 mL/min — ABNORMAL LOW (ref >90)
GFR calc non Af Amer: 28 mL/min — ABNORMAL LOW (ref >90)
Glucose, Bld: 108 mg/dL — ABNORMAL HIGH (ref 70–99)
Potassium: 5.7 mEq/L — ABNORMAL HIGH (ref 3.7–5.3)
Sodium: 124 mEq/L — ABNORMAL LOW (ref 137–147)
Total Bilirubin: 2.7 mg/dL — ABNORMAL HIGH (ref 0.3–1.2)
Total Protein: 6.5 g/dL (ref 6.0–8.3)

## 2014-04-01 LAB — CBC WITH DIFFERENTIAL/PLATELET
BASOS ABS: 0 10*3/uL (ref 0.0–0.1)
Basophils Relative: 0 % (ref 0–1)
Eosinophils Absolute: 0.3 10*3/uL (ref 0.0–0.7)
Eosinophils Relative: 3 % (ref 0–5)
HEMATOCRIT: 38.1 % (ref 36.0–46.0)
HEMOGLOBIN: 13.5 g/dL (ref 12.0–15.0)
LYMPHS ABS: 1 10*3/uL (ref 0.7–4.0)
LYMPHS PCT: 8 % — AB (ref 12–46)
MCH: 31.5 pg (ref 26.0–34.0)
MCHC: 35.4 g/dL (ref 30.0–36.0)
MCV: 88.8 fL (ref 78.0–100.0)
MONOS PCT: 10 % (ref 3–12)
Monocytes Absolute: 1.2 10*3/uL — ABNORMAL HIGH (ref 0.1–1.0)
NEUTROS ABS: 9.9 10*3/uL — AB (ref 1.7–7.7)
Neutrophils Relative %: 79 % — ABNORMAL HIGH (ref 43–77)
Platelets: 148 10*3/uL — ABNORMAL LOW (ref 150–400)
RBC: 4.29 MIL/uL (ref 3.87–5.11)
RDW: 16.4 % — ABNORMAL HIGH (ref 11.5–15.5)
WBC: 12.5 10*3/uL — AB (ref 4.0–10.5)

## 2014-04-01 LAB — PROTIME-INR
INR: 1.48 (ref 0.00–1.49)
Prothrombin Time: 17.5 seconds — ABNORMAL HIGH (ref 11.6–15.2)

## 2014-04-01 LAB — URINALYSIS, ROUTINE W REFLEX MICROSCOPIC
BILIRUBIN URINE: NEGATIVE
Glucose, UA: NEGATIVE mg/dL
HGB URINE DIPSTICK: NEGATIVE
Ketones, ur: NEGATIVE mg/dL
Nitrite: NEGATIVE
PROTEIN: NEGATIVE mg/dL
SPECIFIC GRAVITY, URINE: 1.017 (ref 1.005–1.030)
Urobilinogen, UA: 1 mg/dL (ref 0.0–1.0)
pH: 7 (ref 5.0–8.0)

## 2014-04-01 LAB — AMMONIA: Ammonia: 68 umol/L — ABNORMAL HIGH (ref 11–60)

## 2014-04-01 LAB — URINE MICROSCOPIC-ADD ON

## 2014-04-01 LAB — TROPONIN I: Troponin I: <0.3 ng/mL (ref <0.30)

## 2014-04-01 MED ORDER — POLYETHYLENE GLYCOL 3350 17 G PO PACK: 17.0000 g | PACK | Freq: Every day | ORAL | Status: DC | PRN

## 2014-04-01 MED ORDER — NITROGLYCERIN 0.4 MG SL SUBL: 0.4000 mg | SUBLINGUAL_TABLET | SUBLINGUAL | Status: DC | PRN

## 2014-04-01 MED ORDER — BUSPIRONE HCL 5 MG PO TABS
5.0000 mg | ORAL_TABLET | Freq: Every day | ORAL | Status: DC
  Administered 2014-04-01 – 2014-04-08 (×8): 5 mg via ORAL
  Filled 2014-04-01 (×9): qty 1

## 2014-04-01 MED ORDER — ONDANSETRON 4 MG PO TBDP
8.0000 mg | ORAL_TABLET | Freq: Once | ORAL | Status: AC
  Administered 2014-04-01: 8 mg via ORAL
  Filled 2014-04-01: qty 2

## 2014-04-01 MED ORDER — PANTOPRAZOLE SODIUM 40 MG PO TBEC
40.0000 mg | DELAYED_RELEASE_TABLET | Freq: Two times a day (BID) | ORAL | Status: DC
  Administered 2014-04-01 – 2014-04-09 (×16): 40 mg via ORAL
  Filled 2014-04-01 (×16): qty 1

## 2014-04-01 MED ORDER — PRO-STAT SUGAR FREE PO LIQD
30.0000 mL | Freq: Every day | ORAL | Status: DC
  Administered 2014-04-02 – 2014-04-09 (×8): 30 mL via ORAL
  Filled 2014-04-01 (×10): qty 30

## 2014-04-01 MED ORDER — BOOST / RESOURCE BREEZE PO LIQD
1.0000 | Freq: Three times a day (TID) | ORAL | Status: DC
  Administered 2014-04-02 – 2014-04-09 (×17): 1 via ORAL

## 2014-04-01 MED ORDER — SODIUM CHLORIDE 0.9 % IV SOLN
INTRAVENOUS | Status: DC
  Administered 2014-04-01: 20:00:00 via INTRAVENOUS

## 2014-04-01 MED ORDER — LACTULOSE 10 GM/15ML PO SOLN
20.0000 g | ORAL | Status: DC
  Administered 2014-04-01 – 2014-04-08 (×4): 20 g via ORAL
  Filled 2014-04-01 (×5): qty 30

## 2014-04-01 MED ORDER — ALBUTEROL SULFATE (2.5 MG/3ML) 0.083% IN NEBU: 2.5000 mg | INHALATION_SOLUTION | Freq: Four times a day (QID) | RESPIRATORY_TRACT | Status: DC | PRN

## 2014-04-01 MED ORDER — ONDANSETRON 8 MG PO TBDP
8.0000 mg | ORAL_TABLET | Freq: Three times a day (TID) | ORAL | Status: DC | PRN
  Administered 2014-04-02 – 2014-04-07 (×5): 8 mg via ORAL
  Filled 2014-04-01 (×5): qty 1

## 2014-04-01 MED ORDER — METOCLOPRAMIDE HCL 5 MG PO TABS
5.0000 mg | ORAL_TABLET | Freq: Two times a day (BID) | ORAL | Status: DC
  Administered 2014-04-02 – 2014-04-09 (×16): 5 mg via ORAL
  Filled 2014-04-01 (×18): qty 1

## 2014-04-01 MED ORDER — LACTULOSE ENCEPHALOPATHY 10 GM/15ML PO SOLN: 20.0000 g | ORAL | Status: DC

## 2014-04-01 MED ORDER — DOXEPIN HCL 50 MG PO CAPS
50.0000 mg | ORAL_CAPSULE | Freq: Every day | ORAL | Status: DC
  Administered 2014-04-01 – 2014-04-08 (×8): 50 mg via ORAL
  Filled 2014-04-01 (×9): qty 1

## 2014-04-01 MED ORDER — DOCUSATE SODIUM 100 MG PO CAPS: 100.0000 mg | ORAL_CAPSULE | Freq: Every day | ORAL | Status: DC | PRN

## 2014-04-01 MED ORDER — METHOCARBAMOL 500 MG PO TABS
500.0000 mg | ORAL_TABLET | Freq: Two times a day (BID) | ORAL | Status: DC | PRN
  Administered 2014-04-04 – 2014-04-07 (×3): 500 mg via ORAL
  Filled 2014-04-01 (×3): qty 1

## 2014-04-01 MED ORDER — HEPARIN SODIUM (PORCINE) 5000 UNIT/ML IJ SOLN
5000.0000 [IU] | Freq: Three times a day (TID) | INTRAMUSCULAR | Status: DC
  Administered 2014-04-01 – 2014-04-08 (×20): 5000 [IU] via SUBCUTANEOUS
  Filled 2014-04-01 (×23): qty 1

## 2014-04-01 MED ORDER — MORPHINE SULFATE 2 MG/ML IJ SOLN
1.0000 mg | Freq: Two times a day (BID) | INTRAMUSCULAR | Status: DC | PRN
  Administered 2014-04-02 – 2014-04-07 (×4): 1 mg via INTRAVENOUS
  Filled 2014-04-01 (×4): qty 1

## 2014-04-01 MED ORDER — PRO-STAT SUGAR FREE PO LIQD
30.0000 mL | Freq: Every day | ORAL | Status: DC | PRN
  Administered 2014-04-02: 30 mL via ORAL
  Filled 2014-04-01: qty 30

## 2014-04-01 NOTE — ED Provider Notes (Signed)
CSN: [id number]     Arrival date & time 04/01/14  1156 History   First MD Initiated Contact with Patient 04/01/14 1347     Chief Complaint  Patient presents with  . Weakness     (Consider location/radiation/quality/duration/timing/severity/associated sxs/prior Treatment) HPI patient complains of generalized weakness for approximately the past 2 weeks. Patient was admitted here on 03/24/2014 for azotemia and a lateral abnormalities. Discharged 3 days ago. Reports that she felt generally weak when she left here. Her daughter reports that she is unable to walk unable to get in and out of a car and unable to lift a spoon to feed herself. Patient denies pain anywhere. No other associated symptoms. She was treated with paracentesis while here, during last admission symptoms remain unchanged. Nothing makes symptoms better or worse. Denies shortness of breath. Patient complains of generalized weakness for the pastPast Medical History  Diagnosis Date  . Exogenous obesity   . Hypertension   . Anxiety and depression     chronic  . Hiatal hernia   . Atypical chest pain 06/10/03    normal 2 day adenosine cardiolite  . Memory changes   . Bipolar disorder   . Ascites   . Cirrhosis   . Anginal pain   . Exertional shortness of breath   . Shortness of breath     "all the time right now" (09/19/2013)  . Anemia   . History of blood transfusion 05/2013    "once; related to a stomach ulcer" (09/19/2013)  . Bleeding stomach ulcer 04/22/2013  . GERD (gastroesophageal reflux disease)   . Osteoarthritis   . Arthritis     "all over my body" (09/19/2013)  . Anxiety   . Depression   . Fatty liver disease, nonalcoholic   . CVA (cerebral infarction) 04/21/13    CCT c/w Left cerebellar infarct which appears subacute to remote   Past Surgical History  Procedure Laterality Date  . Tubal ligation  1977  . Cardiac catheterization  1990    normal cath  . Esophagogastroduodenoscopy N/A 04/22/2013    Procedure:  ESOPHAGOGASTRODUODENOSCOPY (EGD);  Surgeon: Shirley Friar, MD;  Location: [facility] ENDOSCOPY;  Service: Endoscopy;  Laterality: N/A;  . Paracentesis  09/13/2013    "just once" (09/19/2013)  . Carpal tunnel release Bilateral 1990's  . Knee arthroscopy w/ acl reconstruction Bilateral ?1990's  . Vaginal hysterectomy  1980  . Salpingoophorectomy Bilateral 1990's   Family History  Problem Relation Age of Onset  . Heart attack Mother   . Heart disease Mother   . Heart disease Father   . Heart attack Father    History  Substance Use Topics  . Smoking status: Never Smoker   . Smokeless tobacco: Never Used  . Alcohol Use: No   OB History   Grav Para Term Preterm Abortions TAB SAB Ect Mult Living                 Review of Systems  HENT: Negative.   Respiratory: Negative.   Cardiovascular: Negative.   Gastrointestinal: Negative.   Musculoskeletal: Negative.   Skin: Negative.   Neurological: Positive for weakness.  Psychiatric/Behavioral: Negative.   All other systems reviewed and are negative.     Allergies  Codeine and Sulfa antibiotics  Home Medications   Prior to Admission medications   Medication Sig Start Date End Date Taking? Authorizing Provider  acetaminophen (TYLENOL) 325 MG tablet Take 1 tablet (325 mg) by mouth 2 every 6 hours as needed for pain 03/28/14  Yes Gwen Pounds, MD  albuterol (PROVENTIL) (2.5 MG/3ML) 0.083% nebulizer solution Take 2.5 mg by nebulization 4 (four) times daily as needed for wheezing.   Yes Historical Provider, MD  Amino Acids-Protein Hydrolys (FEEDING SUPPLEMENT, PRO-STAT SUGAR FREE 64,) LIQD Take 30 mLs by mouth See admin instructions. Take 30 mls by mouth every day after breakfast and offer a dose after dinner   Yes Historical Provider, MD  busPIRone (BUSPAR) 5 MG tablet Take 5 mg by mouth at bedtime.   Yes Historical Provider, MD  cyclobenzaprine (FLEXERIL) 10 MG tablet Take 5 mg by mouth 30 (thirty) minutes before procedure. paracentesis    Yes Historical Provider, MD  docusate sodium (COLACE) 100 MG capsule Take 100 mg by mouth daily as needed (constipation).    Yes Historical Provider, MD  doxepin (SINEQUAN) 50 MG capsule Take 50 mg by mouth at bedtime.   Yes Historical Provider, MD  feeding supplement, RESOURCE BREEZE, (RESOURCE BREEZE) LIQD Take 1 Container by mouth 3 (three) times daily with meals. 03/28/14  Yes Gwen Pounds, MD  furosemide (LASIX) 40 MG tablet Take 40 mg by mouth daily.   Yes Historical Provider, MD  lactulose, encephalopathy, (ENULOSE) 10 GM/15ML SOLN Take 20 g by mouth 3 (three) times a week. Every Tuesday, Thursday and Saturday (watch for diarrhea)   Yes Historical Provider, MD  Melatonin 3 MG TABS Take 3 mg by mouth at bedtime.   Yes Historical Provider, MD  methocarbamol (ROBAXIN) 500 MG tablet Take 1 tablet (500 mg total) by mouth every 12 (twelve) hours as needed for muscle spasms (and 1 hr prior to Paracentesis). 03/28/14  Yes Gwen Pounds, MD  metoCLOPramide (REGLAN) 5 MG tablet Take 5 mg by mouth 2 (two) times daily with breakfast and lunch.    Yes Historical Provider, MD  Multiple Vitamins-Minerals (CERTAVITE/ANTIOXIDANTS) TABS Take 1 tablet by mouth daily. Take on an empty stomach (10am)   Yes Historical Provider, MD  nitroGLYCERIN (NITROSTAT) 0.4 MG SL tablet Place 0.4 mg under the tongue every 5 (five) minutes as needed for chest pain.   Yes Historical Provider, MD  ondansetron (ZOFRAN) 4 MG tablet Take 4 mg by mouth every 6 (six) hours as needed for nausea or vomiting.    Yes Historical Provider, MD  pantoprazole (PROTONIX) 40 MG tablet Take 40 mg by mouth 2 (two) times daily.   Yes Historical Provider, MD  polyethylene glycol (MIRALAX / GLYCOLAX) packet Take 17 g by mouth daily as needed (constipation). Mix with 8 oz of fluid and drink   Yes Historical Provider, MD   BP 117/93  Pulse 99  Temp(Src) 97.3 F (36.3 C) (Oral)  Resp 18  SpO2 98% Physical Exam  Nursing note and vitals  reviewed. Constitutional: She is oriented to person, place, and time. She appears well-developed and well-nourished.  HENT:  Head: Normocephalic and atraumatic.  Mucous membranes dry  Eyes: Conjunctivae are normal. Pupils are equal, round, and reactive to light.  Neck: Neck supple. No tracheal deviation present. No thyromegaly present.  Cardiovascular: Normal rate and regular rhythm.   No murmur heard. Pulmonary/Chest: Effort normal and breath sounds normal.  Abdominal: Soft. Bowel sounds are normal. She exhibits no distension. There is no tenderness.  Morbidly obese  Musculoskeletal: Normal range of motion. She exhibits no edema and no tenderness.  Neurological: She is alert and oriented to person, place, and time. She has normal reflexes. No cranial nerve deficit. Coordination normal.  No asterixis, tremor in hands bilaterally upon  active motion DTRs symmetric bilaterally at knee jerk ankle jerk and biceps toes downward going bilaterally  Skin: Skin is warm and dry. No rash noted.  Psychiatric: She has a normal mood and affect.    ED Course  Procedures (including critical care time) Labs Review Labs Reviewed  CBC WITH DIFFERENTIAL - Abnormal; Notable for the following:    WBC 12.5 (*)    RDW 16.4 (*)    Platelets 148 (*)    Neutrophils Relative % 79 (*)    Neutro Abs 9.9 (*)    Lymphocytes Relative 8 (*)    Monocytes Absolute 1.2 (*)    All other components within normal limits  COMPREHENSIVE METABOLIC PANEL  TROPONIN I  URINALYSIS, ROUTINE W REFLEX MICROSCOPIC  PROTIME-INR  AMMONIA    Imaging Review No results found.   EKG Interpretation   Date/Time:  Tuesday April 01 2014 14:16:47 EDT Ventricular Rate:  98 PR Interval:  140 QRS Duration: 97 QT Interval:  358 QTC Calculation: 457 R Axis:   -14 Text Interpretation:  Sinus rhythm Low voltage, extremity and precordial  leads No significant change since last tracing Confirmed by Ethelda ChickJACUBOWITZ   MD, Claudene Gatliff [phone]) on  04/01/2014 2:49:54 PM     Results for orders placed during the hospital encounter of 04/01/14  CBC WITH DIFFERENTIAL      Result Value Ref Range   WBC 12.5 (*) 4.0 - 10.5 K/uL   RBC 4.29  3.87 - 5.11 MIL/uL   Hemoglobin 13.5  12.0 - 15.0 g/dL   HCT 19.138.1  47.836.0 - 29.546.0 %   MCV 88.8  78.0 - 100.0 fL   MCH 31.5  26.0 - 34.0 pg   MCHC 35.4  30.0 - 36.0 g/dL   RDW 62.116.4 (*) 30.811.5 - 65.715.5 %   Platelets 148 (*) 150 - 400 K/uL   Neutrophils Relative % 79 (*) 43 - 77 %   Neutro Abs 9.9 (*) 1.7 - 7.7 K/uL   Lymphocytes Relative 8 (*) 12 - 46 %   Lymphs Abs 1.0  0.7 - 4.0 K/uL   Monocytes Relative 10  3 - 12 %   Monocytes Absolute 1.2 (*) 0.1 - 1.0 K/uL   Eosinophils Relative 3  0 - 5 %   Eosinophils Absolute 0.3  0.0 - 0.7 K/uL   Basophils Relative 0  0 - 1 %   Basophils Absolute 0.0  0.0 - 0.1 K/uL  COMPREHENSIVE METABOLIC PANEL      Result Value Ref Range   Sodium 124 (*) 137 - 147 mEq/L   Potassium 5.7 (*) 3.7 - 5.3 mEq/L   Chloride 90 (*) 96 - 112 mEq/L   CO2 17 (*) 19 - 32 mEq/L   Glucose, Bld 108 (*) 70 - 99 mg/dL   BUN 846105 (*) 6 - 23 mg/dL   Creatinine, Ser 9.621.80 (*) 0.50 - 1.10 mg/dL   Calcium 9.3  8.4 - 95.210.5 mg/dL   Total Protein 6.5  6.0 - 8.3 g/dL   Albumin 2.6 (*) 3.5 - 5.2 g/dL   AST 841112 (*) 0 - 37 U/L   ALT 64 (*) 0 - 35 U/L   Alkaline Phosphatase 117  39 - 117 U/L   Total Bilirubin 2.7 (*) 0.3 - 1.2 mg/dL   GFR calc non Af Amer 28 (*) >90 mL/min   GFR calc Af Amer 32 (*) >90 mL/min  TROPONIN I      Result Value Ref Range   Troponin I <0.30  <  0.30 ng/mL  URINALYSIS, ROUTINE W REFLEX MICROSCOPIC      Result Value Ref Range   Color, Urine YELLOW  YELLOW   APPearance TURBID (*) CLEAR   Specific Gravity, Urine 1.017  1.005 - 1.030   pH 7.0  5.0 - 8.0   Glucose, UA NEGATIVE  NEGATIVE mg/dL   Hgb urine dipstick NEGATIVE  NEGATIVE   Bilirubin Urine NEGATIVE  NEGATIVE   Ketones, ur NEGATIVE  NEGATIVE mg/dL   Protein, ur NEGATIVE  NEGATIVE mg/dL   Urobilinogen, UA 1.0   0.0 - 1.0 mg/dL   Nitrite NEGATIVE  NEGATIVE   Leukocytes, UA MODERATE (*) NEGATIVE  PROTIME-INR      Result Value Ref Range   Prothrombin Time 17.5 (*) 11.6 - 15.2 seconds   INR 1.48  0.00 - 1.49  AMMONIA      Result Value Ref Range   Ammonia 68 (*) 11 - 60 umol/L  URINE MICROSCOPIC-ADD ON      Result Value Ref Range   Squamous Epithelial / LPF FEW (*) RARE   WBC, UA 7-10  <3 WBC/hpf   RBC / HPF 0-2  <3 RBC/hpf   Bacteria, UA MANY (*) RARE   Urine-Other AMORPHOUS URATES/PHOSPHATES     Dg Chest 2 View  03/02/2014   CLINICAL DATA:  Chest pressure and shortness of breath. Left-sided chest pain.  EXAM: CHEST  2 VIEW  COMPARISON:  10/11/2013  FINDINGS: The cardiac silhouette remains mildly enlarged. Thoracic aortic calcification is noted. There is mild persistent retrocardiac opacity in the left lower lobe, improved from the prior study. The right lung is clear. No definite pleural effusion or pneumothorax is identified. No acute osseous abnormality is identified.  IMPRESSION: Mild left lower lobe opacity, less than on the prior study. This could reflect atelectasis or infectious infiltrate.   Electronically Signed   By: Sebastian AcheAllen  Grady   On: 03/02/2014 20:08   Dg Abd 1 View  03/28/2014   CLINICAL DATA:  Vomiting and diarrhea.  EXAM: ABDOMEN - 1 VIEW  COMPARISON:  None.  FINDINGS: The patient is rotated to the left on today's images.  There is gas within nondilated small bowel and gas and formed stool in the colon. Vascular calcifications noted in the anatomic pelvis. There is evidence of lumbar spondylosis.  IMPRESSION: 1. Unremarkable bowel gas pattern. 2. Lumbar spondylosis.   Electronically Signed   By: Herbie BaltimoreWalt  Liebkemann M.D.   On: 03/28/2014 18:30   Ct Angio Chest Pe W/cm &/or Wo Cm  03/03/2014   CLINICAL DATA:  Severe left chest pain and shortness of breath today.  EXAM: CT ANGIOGRAPHY CHEST WITH CONTRAST  TECHNIQUE: Multidetector CT imaging of the chest was performed using the standard  protocol during bolus administration of intravenous contrast. Multiplanar CT image reconstructions and MIPs were obtained to evaluate the vascular anatomy.  CONTRAST:  70mL OMNIPAQUE IOHEXOL 350 MG/ML SOLN  COMPARISON:  DG CHEST 2 VIEW dated 03/02/2014  FINDINGS: Suboptimal bolus timing, this decreases sensitivity for segmental to subsegmental pulmonary arterial filling defects. No central pulmonary arterial filling defects. Main pulmonary artery is not enlarged.  Heart size is normal, no right heart strain. Thoracic aorta is normal in course and caliber with mild calcific atherosclerosis. Pericardium is unremarkable.  Small hiatal hernia with associated passive atelectasis, no pleural effusions or focal consolidations. Tracheobronchial tree is patent, no pneumothorax.  Included view of the abdomen demonstrates at least moderate amount of ascites, which tracks along the hiatal hernia. Partially imaged cirrhosis.  Severe degenerative change of the included cervical spine and right shoulder, to lesser extent left shoulder.  Review of the MIP images confirms the above findings.  IMPRESSION: Suboptimal bolus timing without central pulmonary arterial filling defect nor acute cardiopulmonary process.  At least moderate ascites, partially imaged, tracking along a small hiatal hernia, which could account for the left lung base opacity seen on prior chest radiograph. Partially imaged cirrhosis.   Electronically Signed   By: Awilda Metro   On: 03/03/2014 00:00   US Paracentesis  03/27/2014   CLINICAL DATA:  Cirrhosis, recurrent ascites, renal failure. Request is made for therapeutic paracentesis up to 5 liters.  EXAM: ULTRASOUND GUIDED THERAPEUTIC PARACENTESIS  COMPARISON:  PRIOR PARACENTESIS ON 03/25/2014  PROCEDURE: An ultrasound guided paracentesis was thoroughly discussed with the patient and questions answered. The benefits, risks, alternatives and complications were also discussed. The patient understands and  wishes to proceed with the procedure. Written consent was obtained.  Ultrasound was performed to localize and mark an adequate pocket of fluid in the left lower quadrant of the abdomen. The area was then prepped and draped in the normal sterile fashion. 1% Lidocaine was used for local anesthesia. Under ultrasound guidance a 19 gauge Yueh catheter was introduced. Paracentesis was performed. The catheter was removed and a dressing applied.  Complications: None.  FINDINGS: A total of approximately 5 liters of amber fluid was removed.  IMPRESSION: Successful ultrasound guided therapeutic paracentesis yielding 5 liters of ascites.  Read by: Jeananne Rama ,P.A.-C.   Electronically Signed   By: Oley Balm M.D.   On: 03/27/2014 11:18   US Paracentesis  03/25/2014   CLINICAL DATA:  Recurrent ascites. Request therapeutic paracentesis.  EXAM: ULTRASOUND GUIDED PARACENTESIS  COMPARISON:  Previous paracentesis  PROCEDURE: An ultrasound guided paracentesis was thoroughly discussed with the patient and questions answered. The benefits, risks, alternatives and complications were also discussed. The patient understands and wishes to proceed with the procedure. Written consent was obtained.  Ultrasound was performed to localize and mark an adequate pocket of fluid in the left lower quadrant of the abdomen. The area was then prepped and draped in the normal sterile fashion. 1% Lidocaine was used for local anesthesia. Under ultrasound guidance a 19 gauge Yueh catheter was introduced. Paracentesis was performed. The catheter was removed and a dressing applied.  COMPLICATIONS: None immediate  FINDINGS: A total of approximately 7.7 L of clear yellow fluid was removed. A fluid sample was not sent for laboratory analysis.  IMPRESSION: Successful ultrasound guided paracentesis yielding 7.7 L of ascites.  Read by: Brayton El PA-C   Electronically Signed   By: Richarda Overlie M.D.   On: 03/25/2014 12:21   US Paracentesis  03/14/2014    CLINICAL DATA:  Cirrhosis, recurrent ascites, request for paracentesis.  EXAM: ULTRASOUND GUIDED therapeutic PARACENTESIS  COMPARISON:  None.  PROCEDURE: An ultrasound guided paracentesis was thoroughly discussed with the patient and questions answered. The benefits, risks, alternatives and complications were also discussed. The patient understands and wishes to proceed with the procedure. Written consent was obtained.  Ultrasound was performed to localize and mark an adequate pocket of fluid in the left middle to lower quadrant of the abdomen. The area was then prepped and draped in the normal sterile fashion. 1% Lidocaine was used for local anesthesia. Under ultrasound guidance a 19 gauge Yueh catheter was introduced. Paracentesis was performed. The catheter was removed and a dressing applied.  Complications: None.  FINDINGS: A total of approximately 8.5 liters  of yellow fluid was removed. A fluid sample was not sent for laboratory analysis.  IMPRESSION: Successful ultrasound guided paracentesis yielding 8.5 liters of ascites.  Read By:  Pattricia Boss PA-C   Electronically Signed   By: Ruel Favors M.D.   On: 03/14/2014 15:09   US Paracentesis  03/04/2014   CLINICAL DATA:  Cirrhosis, ascites, request for paracentesis.  EXAM: ULTRASOUND GUIDED THERAPEUTIC PARACENTESIS  COMPARISON:  None.  PROCEDURE: An ultrasound guided paracentesis was thoroughly discussed with the patient and questions answered. The benefits, risks, alternatives and complications were also discussed. The patient understands and wishes to proceed with the procedure. Written consent was obtained.  Ultrasound was performed to localize and mark an adequate pocket of fluid in the left upper quadrant of the abdomen. The area was then prepped and draped in the normal sterile fashion. 1% Lidocaine was used for local anesthesia. Under ultrasound guidance a 19 gauge Yueh catheter was introduced. Paracentesis was performed. The catheter was removed and  a dressing applied.  Complications: None.  FINDINGS: A total of approximately 8.6 liters of yellow fluid was removed. A fluid sample was not sent for laboratory analysis.  IMPRESSION: Successful ultrasound guided paracentesis yielding 8.6 liters of ascites.  Read By:  Pattricia Boss PA-C   Electronically Signed   By: Ruel Favors M.D.   On: 03/04/2014 15:25   Dg Chest Port 1 View  03/25/2014   CLINICAL DATA:  Shortness of breath, history hypertension  EXAM: PORTABLE CHEST - 1 VIEW  COMPARISON:  Portable exam 0721 hr compared to 03/02/2014  FINDINGS: Upper normal size of cardiac silhouette.  Atherosclerotic calcification aorta.  Moderate-sized hiatal hernia.  Pulmonary vascularity normal.  No definite infiltrate, pleural effusion or pneumothorax.  Minimal atelectasis left base.  IMPRESSION: Minimal left base atelectasis.  Moderate-sized hiatal hernia.   Electronically Signed   By: Ulyses Southward M.D.   On: 03/25/2014 08:17    MDM  Spoke with Dr. Timothy Lasso plan admit medical surgical floor. Patient is DO NOT RESUSCITATE CODE STATUS. Gentle intravenousvenous hydration. Final diagnoses:  None   Diagnosis #1 weakness #2 dehydration #3 chronic renal deficiency #4 hyperkalemia #5 hyponatreima    Doug Sou, MD 04/01/14 2132

## 2014-04-01 NOTE — ED Notes (Signed)
Patient placed on bedpan and asked for some privacy. Patient made aware to use call Yoselin Amerman when nurse is to return. Daughter at bedside.

## 2014-04-01 NOTE — ED Notes (Signed)
Patient placed on the bedpan and asked for some privacy. Daughter at the bedside.

## 2014-04-01 NOTE — H&P (Signed)
[name] is an 68 y.o. female.   PCP:   Gwen Pounds, MD   Chief Complaint:  Continued profound weakness, N/V, Electrolyte abnormalities, deH  HPI: 86 F c known progressive liver failure s/p admission last week c refractory ascites, ARF, and electrolyte issues.  We tapped off 15+ liters and she was ready for D/c on Friday.  She had N/v and was ill so it was postponed till Sat.  Since being back at [facility] she has been very weak.  Cannot lift silverware, cannot walk, continued N/V.  She was supposed to see me tomorrow as outpt but daughter Delaney Meigs called and said she was too weak to make it.  We asked her to call EMS and transport Ms [place] to the ED for eval and Rx. Patient denies pain anywhere. No other associated symptoms.  Denies shortness of breath.  Na back down to 124.  K up again.  Cr 1.8.  LFTs are up but stable.  WBC is up and hopefully not getting a peritonitis. It is felt she is deH and NS at 35cc/hr started and will be provided.   Past Medical History:  Past Medical History  Diagnosis Date  . Exogenous obesity   . Hypertension   . Anxiety and depression     chronic  . Hiatal hernia   . Atypical chest pain 06/10/03    normal 2 day adenosine cardiolite  . Memory changes   . Bipolar disorder   . Ascites   . Cirrhosis   . Anginal pain   . Exertional shortness of breath   . Shortness of breath     "all the time right now" (09/19/2013)  . Anemia   . History of blood transfusion 05/2013    "once; related to a stomach ulcer" (09/19/2013)  . Bleeding stomach ulcer 04/22/2013  . GERD (gastroesophageal reflux disease)   . Osteoarthritis   . Arthritis     "all over my body" (09/19/2013)  . Anxiety   . Depression   . Fatty liver disease, nonalcoholic   . CVA (cerebral infarction) 04/21/13    CCT c/w Left cerebellar infarct which appears subacute to remote    Past Surgical History  Procedure Laterality Date  . Tubal ligation  1977  . Cardiac catheterization   1990    normal cath  . Esophagogastroduodenoscopy N/A 04/22/2013    Procedure: ESOPHAGOGASTRODUODENOSCOPY (EGD);  Surgeon: Shirley Friar, MD;  [facility]: [facility] ENDOSCOPY;  Service: Endoscopy;  Laterality: N/A;  . Paracentesis  09/13/2013    "just once" (09/19/2013)  . Carpal tunnel release Bilateral 1990's  . Knee arthroscopy w/ acl reconstruction Bilateral ?1990's  . Vaginal hysterectomy  1980  . Salpingoophorectomy Bilateral 1990's      Allergies:   Allergies  Allergen Reactions  . Codeine Itching  . Sulfa Antibiotics Other (See Comments)    Pull my hair out     Medications: Prior to Admission medications   Medication Sig Start Date End Date Taking? Authorizing Provider  acetaminophen (TYLENOL) 325 MG tablet Take 1 tablet (325 mg) by mouth 2 every 6 hours as needed for pain 03/28/14  Yes Gwen Pounds, MD  albuterol (PROVENTIL) (2.5 MG/3ML) 0.083% nebulizer solution Take 2.5 mg by nebulization 4 (four) times daily as needed for wheezing.   Yes Historical Provider, MD  Amino Acids-Protein Hydrolys (FEEDING SUPPLEMENT, PRO-STAT SUGAR FREE 64,) LIQD Take 30 mLs by mouth See admin instructions. Take 30 mls by mouth every day after breakfast and offer a  dose after dinner   Yes Historical Provider, MD  busPIRone (BUSPAR) 5 MG tablet Take 5 mg by mouth at bedtime.   Yes Historical Provider, MD  cyclobenzaprine (FLEXERIL) 10 MG tablet Take 5 mg by mouth 30 (thirty) minutes before procedure. paracentesis   Yes Historical Provider, MD  docusate sodium (COLACE) 100 MG capsule Take 100 mg by mouth daily as needed (constipation).    Yes Historical Provider, MD  doxepin (SINEQUAN) 50 MG capsule Take 50 mg by mouth at bedtime.   Yes Historical Provider, MD  feeding supplement, RESOURCE BREEZE, (RESOURCE BREEZE) LIQD Take 1 Container by mouth 3 (three) times daily with meals. 03/28/14  Yes Gwen Pounds, MD  furosemide (LASIX) 40 MG tablet Take 40 mg by mouth daily.   Yes Historical Provider, MD   lactulose, encephalopathy, (ENULOSE) 10 GM/15ML SOLN Take 20 g by mouth 3 (three) times a week. Every Tuesday, Thursday and Saturday (watch for diarrhea)   Yes Historical Provider, MD  Melatonin 3 MG TABS Take 3 mg by mouth at bedtime.   Yes Historical Provider, MD  methocarbamol (ROBAXIN) 500 MG tablet Take 1 tablet (500 mg total) by mouth every 12 (twelve) hours as needed for muscle spasms (and 1 hr prior to Paracentesis). 03/28/14  Yes Gwen Pounds, MD  metoCLOPramide (REGLAN) 5 MG tablet Take 5 mg by mouth 2 (two) times daily with breakfast and lunch.    Yes Historical Provider, MD  Multiple Vitamins-Minerals (CERTAVITE/ANTIOXIDANTS) TABS Take 1 tablet by mouth daily. Take on an empty stomach (10am)   Yes Historical Provider, MD  nitroGLYCERIN (NITROSTAT) 0.4 MG SL tablet Place 0.4 mg under the tongue every 5 (five) minutes as needed for chest pain.   Yes Historical Provider, MD  ondansetron (ZOFRAN) 4 MG tablet Take 4 mg by mouth every 6 (six) hours as needed for nausea or vomiting.    Yes Historical Provider, MD  pantoprazole (PROTONIX) 40 MG tablet Take 40 mg by mouth 2 (two) times daily.   Yes Historical Provider, MD  polyethylene glycol (MIRALAX / GLYCOLAX) packet Take 17 g by mouth daily as needed (constipation). Mix with 8 oz of fluid and drink   Yes Historical Provider, MD      (Not in a hospital admission)   Social History:  reports that she has never smoked. She has never used smokeless tobacco. She reports that she does not drink alcohol or use illicit drugs.  Family History: Family History  Problem Relation Age of Onset  . Heart attack Mother   . Heart disease Mother   . Heart disease Father   . Heart attack Father     Review of Systems:  Review of Systems - Negative x whats in HPI. No CP or SOB   Physical Exam:  Blood pressure 99/68, pulse 97, temperature 97.3 F (36.3 C), temperature source Oral, resp. rate 15, SpO2 94.00%. Filed Vitals:   04/01/14 1530 04/01/14  1615 04/01/14 1645 04/01/14 1715  BP: 101/68 91/56 91/57  99/68  Pulse: 98 94 95 97  Temp:      TempSrc:      Resp: 12 15 13 15   SpO2: 97% 100% 98% 94%   General appearance: Alert and oriented and looks better than expected. Head: Normocephalic, without obvious abnormality, atraumatic OP - Dry Neck: no adenopathy, no carotid bruit, no JVD and thyroid not enlarged, symmetric, no tenderness/mass/nodules Resp: CTA Cardio: Reg GI: soft bloated.  Ascites not tense. Extremities: extremities normal, atraumatic, no cyanosis or edema  Pulses: 2+ and symmetric Lymph nodes:no cervical lymphadenopathy Neurologic: Alert and oriented X 3, generalized weakness.    Labs on Admission:   Recent Labs  04/01/14 1336  NA 124*  K 5.7*  CL 90*  CO2 17*  GLUCOSE 108*  BUN 105*  CREATININE 1.80*  CALCIUM 9.3    Recent Labs  04/01/14 1336  AST 112*  ALT 64*  ALKPHOS 117  BILITOT 2.7*  PROT 6.5  ALBUMIN 2.6*   No results found for this basename: LIPASE, AMYLASE,  in the last 72 hours  Recent Labs  04/01/14 1336  WBC 12.5*  NEUTROABS 9.9*  HGB 13.5  HCT 38.1  MCV 88.8  PLT 148*    Recent Labs  04/01/14 1338  TROPONINI <0.30   Lab Results  Component Value Date   INR 1.48 04/01/2014   INR 1.44 03/24/2014   INR 1.42 03/02/2014     LAB RESULT POCT:  Results for orders placed during the hospital encounter of 04/01/14  CBC WITH DIFFERENTIAL      Result Value Ref Range   WBC 12.5 (*) 4.0 - 10.5 K/uL   RBC 4.29  3.87 - 5.11 MIL/uL   Hemoglobin 13.5  12.0 - 15.0 g/dL   HCT 95.638.1  21.336.0 - 08.646.0 %   MCV 88.8  78.0 - 100.0 fL   MCH 31.5  26.0 - 34.0 pg   MCHC 35.4  30.0 - 36.0 g/dL   RDW 57.816.4 (*) 46.911.5 - 62.915.5 %   Platelets 148 (*) 150 - 400 K/uL   Neutrophils Relative % 79 (*) 43 - 77 %   Neutro Abs 9.9 (*) 1.7 - 7.7 K/uL   Lymphocytes Relative 8 (*) 12 - 46 %   Lymphs Abs 1.0  0.7 - 4.0 K/uL   Monocytes Relative 10  3 - 12 %   Monocytes Absolute 1.2 (*) 0.1 - 1.0 K/uL    Eosinophils Relative 3  0 - 5 %   Eosinophils Absolute 0.3  0.0 - 0.7 K/uL   Basophils Relative 0  0 - 1 %   Basophils Absolute 0.0  0.0 - 0.1 K/uL  COMPREHENSIVE METABOLIC PANEL      Result Value Ref Range   Sodium 124 (*) 137 - 147 mEq/L   Potassium 5.7 (*) 3.7 - 5.3 mEq/L   Chloride 90 (*) 96 - 112 mEq/L   CO2 17 (*) 19 - 32 mEq/L   Glucose, Bld 108 (*) 70 - 99 mg/dL   BUN 528105 (*) 6 - 23 mg/dL   Creatinine, Ser 4.131.80 (*) 0.50 - 1.10 mg/dL   Calcium 9.3  8.4 - 24.410.5 mg/dL   Total Protein 6.5  6.0 - 8.3 g/dL   Albumin 2.6 (*) 3.5 - 5.2 g/dL   AST 010112 (*) 0 - 37 U/L   ALT 64 (*) 0 - 35 U/L   Alkaline Phosphatase 117  39 - 117 U/L   Total Bilirubin 2.7 (*) 0.3 - 1.2 mg/dL   GFR calc non Af Amer 28 (*) >90 mL/min   GFR calc Af Amer 32 (*) >90 mL/min  TROPONIN I      Result Value Ref Range   Troponin I <0.30  <0.30 ng/mL  URINALYSIS, ROUTINE W REFLEX MICROSCOPIC      Result Value Ref Range   Color, Urine YELLOW  YELLOW   APPearance TURBID (*) CLEAR   Specific Gravity, Urine 1.017  1.005 - 1.030   pH 7.0  5.0 - 8.0  Glucose, UA NEGATIVE  NEGATIVE mg/dL   Hgb urine dipstick NEGATIVE  NEGATIVE   Bilirubin Urine NEGATIVE  NEGATIVE   Ketones, ur NEGATIVE  NEGATIVE mg/dL   Protein, ur NEGATIVE  NEGATIVE mg/dL   Urobilinogen, UA 1.0  0.0 - 1.0 mg/dL   Nitrite NEGATIVE  NEGATIVE   Leukocytes, UA MODERATE (*) NEGATIVE  PROTIME-INR      Result Value Ref Range   Prothrombin Time 17.5 (*) 11.6 - 15.2 seconds   INR 1.48  0.00 - 1.49  AMMONIA      Result Value Ref Range   Ammonia 68 (*) 11 - 60 umol/L  URINE MICROSCOPIC-ADD ON      Result Value Ref Range   Squamous Epithelial / LPF FEW (*) RARE   WBC, UA 7-10  <3 WBC/hpf   RBC / HPF 0-2  <3 RBC/hpf   Bacteria, UA MANY (*) RARE   Urine-Other AMORPHOUS URATES/PHOSPHATES        Radiological Exams on Admission: No results found.    Orders placed during the hospital encounter of 04/01/14  . ED EKG  . ED EKG  . EKG 12-LEAD   . EKG 12-LEAD     Assessment/Plan Active Problems:   Hyponatremia  Liver Failure - progressing to end stage. Refractory Ascites - Getting multiple recent Taps and will continue.  We have discussed PleurX catheter. Continue DNR Progressing towards Hospice/Palliative Care Hyponatremia/Azotemia/DeH - For IVF Metabolic Acidosis - low dose fluids. Leukocytosis - monitor. Gen Weakness - PT/OT may need SNF/Rehab Prot cal Malnutrition. DVT Proph.  Will work on gentle hydrate and to correct electrolytes and azotemia.  Get PT/OY/SW - Probably will need SNF.  May need tap by Thursday.  Gwen PoundsJohn M Tekesha Almgren 04/01/2014, 5:32 PM

## 2014-04-01 NOTE — ED Notes (Addendum)
Patient placed on the bedpan and asked for a moment. Daughter at the bedside with patient.

## 2014-04-01 NOTE — ED Notes (Signed)
Report attempted x 1

## 2014-04-01 NOTE — ED Notes (Signed)
Per EMS, patient stayed five days in the hospital last week with complaints of vomiting and generalized weakness. Patient was discharged Saturday, and at the nursing home has seen no improvement. Patient Complains of nausea, vomiting, and generalized weakness. Vitals per EMS: 92/44, 100 HR, 96 on RA, 166 CBG.

## 2014-04-02 DIAGNOSIS — E43 Unspecified severe protein-calorie malnutrition: Secondary | ICD-10-CM | POA: Insufficient documentation

## 2014-04-02 LAB — BASIC METABOLIC PANEL
BUN: 105 mg/dL — AB (ref 6–23)
CHLORIDE: 91 meq/L — AB (ref 96–112)
CO2: 15 meq/L — AB (ref 19–32)
Calcium: 9 mg/dL (ref 8.4–10.5)
Creatinine, Ser: 1.83 mg/dL — ABNORMAL HIGH (ref 0.50–1.10)
GFR calc Af Amer: 32 mL/min — ABNORMAL LOW (ref >90)
GFR calc non Af Amer: 27 mL/min — ABNORMAL LOW (ref >90)
GLUCOSE: 97 mg/dL (ref 70–99)
POTASSIUM: 5.2 meq/L (ref 3.7–5.3)
SODIUM: 124 meq/L — AB (ref 137–147)

## 2014-04-02 LAB — CBC
HCT: 35.5 % — ABNORMAL LOW (ref 36.0–46.0)
Hemoglobin: 12.3 g/dL (ref 12.0–15.0)
MCH: 31.1 pg (ref 26.0–34.0)
MCHC: 34.6 g/dL (ref 30.0–36.0)
MCV: 89.6 fL (ref 78.0–100.0)
Platelets: 126 10*3/uL — ABNORMAL LOW (ref 150–400)
RBC: 3.96 MIL/uL (ref 3.87–5.11)
RDW: 16.3 % — ABNORMAL HIGH (ref 11.5–15.5)
WBC: 8.6 10*3/uL (ref 4.0–10.5)

## 2014-04-02 LAB — LACTIC ACID, PLASMA: LACTIC ACID, VENOUS: 3.2 mmol/L — AB (ref 0.5–2.2)

## 2014-04-02 MED ORDER — ADULT MULTIVITAMIN W/MINERALS CH
1.0000 | ORAL_TABLET | Freq: Every day | ORAL | Status: DC
  Administered 2014-04-03 – 2014-04-09 (×7): 1 via ORAL
  Filled 2014-04-02 (×8): qty 1

## 2014-04-02 NOTE — Clinical Documentation Improvement (Signed)
INITIAL NUTRITION ASSESSMENT  04/02/2014  Pt meets criteria for SEVERE MALNUTRITION in the context of CHRONIC ILLNESS as evidenced by estimated energy intake <75% of estimated energy needs for > 1 month, 16% weight loss in less than 7 months, and severe fluid accumulation.  Possible Clinical Conditions?  Severe Malnutrition   Protein Calorie Malnutrition Severe Protein Calorie Malnutrition    Other Condition Cannot clinically determine  Supporting Information:Inadequate oral intake related to poor appetite, nausea, and vomiting as evidenced by 3% weight loss in less than one week and 16% weight loss in less than 7 months.   Risk Factors:progressive liver failure,refractory ascites, ARF, and electrolyte issues.   Signs & Symptoms:lost 16% of her body weight in the past 6-7 months.  Pt reports having poor appetite for several months and only eating 1-2 bites of food at each meal Subcutaneous Fat:  Orbital Region: mild wasting  Upper Arm Region: mild wasting Muscle:  Temple Region: mild wasting  Clavicle Bone Region: mild wasting  Clavicle and Acromion Bone Region: mild/moderate wasting  Dorsal Hand: moderate wasting Anterior Thigh Region: mild wasting   Edema: +2 RLE and LLE edema  Diagnostics:Height: 5'6"   Weight: 229 lb 11.5 oz   Body mass index is 37.1 kg/  Treatment:INTERVENTION:  Continue Resource Breeze TID  Continue Pro-stats BID  Provide Snacks BID  Provide Multivitamin with minerals daily  RD to continue to monitor  If pt continues with N/V and inability to consume PO's recommend considering J-tube placement for short term post-pyloric feeds Monitor:  PO intake, weight trend, labs    Thank You, [name] ,RN Clinical Documentation Specialist:  [phone]  [facility]- Health Information Management

## 2014-04-02 NOTE — Evaluation (Signed)
Occupational Therapy Evaluation Patient Details Name: Elizabeth BongCheryl D Jacobson MRN: [id number] DOB: 1946-11-26 Today's Date: 04/02/2014    History of Present Illness [age] F c known progressive liver failure s/p admission last week c refractory ascites, ARF, and electrolyte issues. We tapped off 15+ liters and she was ready for D/c on Friday. She had N/v and was ill so it was postponed till Sat. [facility] has been very weak. Per MD note pt progressing towards hospice/palliative care consult secondary to end stage liver failure.     Clinical Impression   Patient presents as an obese female in end satge liver failure.  Patient unable to eat due to severe nausea.  Patient extremely deconditioned.  Patient required assistance with all ADL's prior to this hospitalization, however currently requires total assistance and is unable to even sit at the edge of the bed due to weakness.  Patient will benefit from skilled OT intervention to decrease the level of assistance she will need with ADL and functional mobility in SNF upon discharge    Follow Up Recommendations       Equipment Recommendations       Recommendations for Other Services       Precautions / Restrictions Precautions Precautions: Fall Restrictions Weight Bearing Restrictions: No      Mobility Bed Mobility Overal bed mobility: Needs Assistance;+2 for physical assistance Bed Mobility: Supine to Sit;Sit to Supine     Supine to sit: +2 for physical assistance;Max assist Sit to supine: Mod assist   General bed mobility comments: Patient veryy fatigued at  end of day.  Unable to fully achieve sitting from position with head of bed elevated to seated position  Transfers                      Balance                                            ADL Overall ADL's : Needs assistance/impaired Eating/Feeding: Minimal assistance;Bed level   Grooming: Wash/dry hands;Wash/dry face;Oral  care;Applying deodorant;Brushing hair;Minimal assistance;Bed level   Upper Body Bathing: Moderate assistance;Bed level   Lower Body Bathing: Total assistance;Bed level;+2 for physical assistance           Toilet Transfer:  (Unable to achieve sit at edge of bed safely due to weakness) Toilet Transfer Details (indicate cue type and reason): Not attempted Toileting- Clothing Manipulation and Hygiene: +2 for physical assistance;Total assistance         General ADL Comments: Patient anxious, yet willing to participate.  Patient with movement throughout LE's, but difficult to access functionally for bed mobility, supine to sit     Vision                     Perception Perception Perception Tested?: No   Praxis Praxis Praxis tested?: Not tested    Pertinent Vitals/Pain Denies pain     Hand Dominance Right   Extremity/Trunk Assessment Upper Extremity Assessment Upper Extremity Assessment: RUE deficits/detail;LUE deficits/detail RUE Deficits / Details: AROM shoulder flexion to about 70 degrees - crepitus RUE Coordination: decreased fine motor;decreased gross motor (Tremor) LUE Deficits / Details: AROM shoulder flexion to about 50 degrees - crepitus evident with PROM LUE Coordination: decreased fine motor;decreased gross motor (tremor)   Lower Extremity Assessment Lower Extremity Assessment: Generalized weakness;Defer to PT  evaluation   Cervical / Trunk Assessment Cervical / Trunk Assessment: Kyphotic;Lordotic   Communication Communication Communication: No difficulties   Cognition Arousal/Alertness: Awake/alert Behavior During Therapy: WFL for tasks assessed/performed;Anxious Overall Cognitive Status: Within Functional Limits for tasks assessed                     General Comments       Exercises       Shoulder Instructions      Home Living Family/patient expects to be discharged to:: Assisted living (Patient indicates ALF - with increased  services for nursing care)                             Home Equipment: Walker - 2 wheels;Adaptive equipment;Toilet riser;Grab bars - toilet;Grab bars - tub/shower;Shower seat Adaptive Equipment: Reacher;Sock aid;Long-handled shoe horn;Long-handled sponge Additional Comments: Patient indicates she had assistance for transfers, toileting, and walking has been limited since fall in July 2014      Prior Functioning/Environment Level of Independence: Needs assistance  Gait / Transfers Assistance Needed: for past 4-5 days pt has been total (A); use of hoyer lift for transfers  ADL's / Homemaking Assistance Needed: total (A)    Comments: deconditioning and decline in function recently     OT Diagnosis:     OT Problem List:     OT Treatment/Interventions:      OT Goals(Current goals can be found in the care plan section)    OT Frequency:     Barriers to D/C:            Co-evaluation              End of Session    Activity Tolerance:   Patient left:     Time:  -    Charges:    G-Codes:    Collier SalinaKristin M Jizel Cheeks 04/02/2014, 4:16 PM

## 2014-04-02 NOTE — Evaluation (Signed)
Physical Therapy Evaluation Patient Details Name: Elizabeth BongCheryl D Ragle MRN: [id number] DOB: 07/20/1946 Today's Date: 04/02/2014   History of Present Illness    [age] F c known progressive liver failure s/p admission last week c refractory ascites, ARF, and electrolyte issues. We tapped off 15+ liters and she was ready for D/c on Friday. She had N/v and was ill so it was postponed till Sat. [facility] has been very weak. Per MD note pt progressing towards hospice/palliative care consult secondary to end stage liver failure.    Clinical Impression  Pt adm due to the above. Presents with significant decline in functional mobility and strength. Pt to benefit from skilled acute PT to address deficits listed below and maximize functional mobility. Pt stated during session that she is motivated "to get up and move again". At this time pt only able to tolerate sitting EOB ~3 min and then required returning to supine position due to fatigue and nausea. At this time will recommend SNF for 24/7 (A) and post acute rehab. Will cont to follow and assess POC and goals if palliative is consulted.     Follow Up Recommendations SNF;Supervision/Assistance - 24 hour    Equipment Recommendations  None recommended by PT    Recommendations for Other Services       Precautions / Restrictions Precautions Precautions: Fall Restrictions Weight Bearing Restrictions: No      Mobility  Bed Mobility Overal bed mobility: Needs Assistance Bed Mobility: Supine to Sit;Sit to Supine;Rolling Rolling: Max assist   Supine to sit: Max assist;HOB elevated Sit to supine: Max assist   General bed mobility comments: max (A) to bring LEs off EOB while elevating trunk to sititng position; pt unable to use Rt UE with transfers due to pain in shoulder; requires incr time and max cueing; pt with decreased safety awareness when fatigued after sitting EOB and almost hit her head on handrails due to decreased  control when returning to supine; RN (A) to scoot HOB   Transfers                 General transfer comment: will require lift at this time   Ambulation/Gait                Stairs            Wheelchair Mobility    Modified Rankin (Stroke Patients Only)       Balance Overall balance assessment: Needs assistance Sitting-balance support: Feet supported;Bilateral upper extremity supported Sitting balance-Leahy Scale: Poor Sitting balance - Comments: pt tolerated ~3 min of sitting EOB then requesting return to supine due to nausea and fatigue; pt with posterior lean throughout and required (A) >50% of time to maintain upright posture                                      Pertinent Vitals/Pain No c/o pain. C/o nausea. RN notified.     Home Living Family/patient expects to be discharged to:: Skilled nursing facility               Home Equipment: Dan HumphreysWalker - 2 wheels;Adaptive equipment;Toilet riser;Grab bars - toilet;Grab bars - tub/shower;Shower seat Additional Comments: per pt; up until 4-5 days ago, pt was able to ambulate with RW and transfer independently to/from wheelchair/bed/toilet     Prior Function Level of Independence: Needs assistance   Gait / Transfers Assistance  Needed: for past 4-5 days pt has been total (A); use of hoyer lift for transfers   ADL's / Homemaking Assistance Needed: total (A)   Comments: deconditioning and decline in function recently      Hand Dominance   Dominant Hand: Right    Extremity/Trunk Assessment   Upper Extremity Assessment: Defer to OT evaluation           Lower Extremity Assessment: Generalized weakness      Cervical / Trunk Assessment: Kyphotic  Communication   Communication: No difficulties  Cognition Arousal/Alertness: Awake/alert Behavior During Therapy: Anxious Overall Cognitive Status: Within Functional Limits for tasks assessed                      General Comments       Exercises Low Level/ICU Exercises Ankle Circles/Pumps: AROM;Both;10 reps;Supine Quad Sets: AROM;Both;5 reps;Supine Heel Slides: AAROM;Both;10 reps;Supine      Assessment/Plan    PT Assessment Patient needs continued PT services  PT Diagnosis Difficulty walking;Generalized weakness   PT Problem List Decreased strength;Decreased activity tolerance;Decreased balance;Decreased mobility;Obesity  PT Treatment Interventions DME instruction;Functional mobility training;Therapeutic activities;Therapeutic exercise;Balance training;Patient/family education   PT Goals (Current goals can be found in the Care Plan section) Acute Rehab PT Goals Patient Stated Goal: to get moving again  PT Goal Formulation: With patient Time For Goal Achievement: 04/16/14 Potential to Achieve Goals: Fair    Frequency Min 2X/week   Barriers to discharge        Co-evaluation               End of Session   Activity Tolerance: Patient limited by fatigue Patient left: in bed;with call bell/phone within reach Nurse Communication: Mobility status;Need for lift equipment         Time: 1012-1032 PT Time Calculation (min): 20 min   Charges:   PT Evaluation $Initial PT Evaluation Tier I: 1 Procedure PT Treatments $Therapeutic Activity: 8-22 mins   PT G CodesNadara Mustard:          Herlinda Heady N [place], [place] [phone] 04/02/2014, 10:43 AM

## 2014-04-02 NOTE — Progress Notes (Signed)
Subjective: Still breathing well. Very Weak. Tired. Some N/V/Dry Heaves. Mild Diarrhea   Objective: Vital signs in last 24 hours: Temp:  [97.3 F (36.3 C)-98.1 F (36.7 C)] 97.6 F (36.4 C) (04/22 0554) Pulse Rate:  [94-103] 103 (04/22 0554) Resp:  [12-24] 18 (04/22 0554) BP: (87-117)/(34-93) 98/47 mmHg (04/22 0554) SpO2:  [94 %-100 %] 98 % (04/22 0554) Weight:  [104.2 kg (229 lb 11.5 oz)-105 kg (231 lb 7.7 oz)] 104.2 kg (229 lb 11.5 oz) (04/22 0554) Weight change:  Last BM Date: 04/01/14  CBG (last 3)  No results found for this basename: GLUCAP,  in the last 72 hours  Intake/Output from previous day:  Intake/Output Summary (Last 24 hours) at 04/02/14 [id number] Last data filed at 04/02/14 0549  Gross per 24 hour  Intake    298 ml  Output      0 ml  Net    298 ml   04/21 0701 - 04/22 0700 In: 298 [I.V.:298] Out: -    Physical Exam General appearance: Appears tired Eyes: no scleral icterus Throat: oropharynx moist without erythema Resp: CTA Cardio: Reg GI: soft, Obese, Ascites but not tense Extremities: no clubbing, cyanosis or edema Bruises noted General weakness noted.   Lab Results:  Recent Labs  04/01/14 1336 04/02/14 0350  NA 124* 124*  K 5.7* 5.2  CL 90* 91*  CO2 17* 15*  GLUCOSE 108* 97  BUN 105* 105*  CREATININE 1.80* 1.83*  CALCIUM 9.3 9.0     Recent Labs  04/01/14 1336  AST 112*  ALT 64*  ALKPHOS 117  BILITOT 2.7*  PROT 6.5  ALBUMIN 2.6*     Recent Labs  04/01/14 1336 04/02/14 0350  WBC 12.5* 8.6  NEUTROABS 9.9*  --   HGB 13.5 12.3  HCT 38.1 35.5*  MCV 88.8 89.6  PLT 148* 126*    Lab Results  Component Value Date   INR 1.48 04/01/2014   INR 1.44 03/24/2014   INR 1.42 03/02/2014     Recent Labs  04/01/14 1338  TROPONINI <0.30    No results found for this basename: TSH, T4TOTAL, FREET3, T3FREE, THYROIDAB,  in the last 72 hours  No results found for this basename: VITAMINB12, FOLATE, FERRITIN, TIBC, IRON,  RETICCTPCT,  in the last 72 hours  Micro Results: No results found for this or any previous visit (from the past 240 hour(s)).   Studies/Results: No results found.   Medications: Scheduled: . sodium chloride   Intravenous STAT  . busPIRone  5 mg Oral QHS  . doxepin  50 mg Oral QHS  . feeding supplement (PRO-STAT SUGAR FREE 64)  30 mL Oral QPC breakfast  . feeding supplement (RESOURCE BREEZE)  1 Container Oral TID WC  . heparin  5,000 Units Subcutaneous 3 times per day  . lactulose  20 g Oral Once per day on Tue Thu Sat  . metoCLOPramide  5 mg Oral BID WC  . pantoprazole  40 mg Oral BID   Continuous:    Assessment/Plan: Active Problems:   Hyponatremia   Liver Failure - progressing to end stage. LFTs and INR have been stable.  Mentally she is fine. Ammonia 68 and on Lactulose.  No Asterixis.  Refractory Ascites - Getting multiple recent Taps and will continue. We have discussed PleurX catheter. May need tap by end of week? DNR  Progressing towards Hospice/Palliative Care  N/V/Dry Heave - recent KUB was (-).  She is on Reglan/Zofran.  She is able to get  some food in.  She is on supplements Hyponatremia/Azotemia/DeH - continue low dose IVF for another 12-24 hrs.  Continue free water restriction. Metabolic Acidosis - low dose fluids. Check lactate??  May need NaHCO3 for short time?? Leukocytosis - already resolved - no obvious infection.  Urine dirty - await Cx.  Hard for her to do a true clean catch midstream Gen Weakness - PT/OT ordered and expected recommendation of SNF/Rehab  Prot cal Malnutrition.  DVT Proph - Hep SQ.  Watch platelets. Will continue work on gentle hydration and to correct electrolytes and azotemia. Get PT/OT/SW - Probably will need SNF. May need tap by Thursday.     LOS: 1 day   Elizabeth PoundsJohn Jacobson [name] 04/02/2014, 9:21 AM

## 2014-04-02 NOTE — Progress Notes (Addendum)
INITIAL NUTRITION ASSESSMENT  DOCUMENTATION CODES Per approved criteria  -Severe malnutrition in the context of chronic illness -Obesity Unspecified  Pt meets criteria for SEVERE MALNUTRITION in the context of CHRONIC ILLNESS as evidenced by estimated energy intake <75% of estimated energy needs for > 1 month, 16% weight loss in less than 7 months, and severe fluid accumulation.  INTERVENTION: Continue Resource Breeze TID Continue Pro-stats BID Provide Snacks BID Provide Multivitamin with minerals daily RD to continue to monitor If pt continues with N/V and inability to consume PO's recommend considering J-tube placement for short term post-pyloric feeds  NUTRITION DIAGNOSIS: Inadequate oral intake related to poor appetite, nausea, and vomiting as evidenced by 3% weight loss in less than one week and 16% weight loss in less than 7 months.   Goal: Pt to meet >/= 90% of their estimated nutrition needs   Monitor:  PO intake, weight trend, labs  Reason for Assessment: Consult (Malnutrition)  68 y.o. female  Admitting Dx: <principal problem not specified>  ASSESSMENT: 64 F c known progressive liver failure s/p admission last week with refractory ascites, ARF, and electrolyte issues. We tapped off 15+ liters and she was ready for D/c on Friday. She had N/v and was ill so it was postponed till Sat. Since being back at [facility] she has been very weak. Cannot lift silverware, cannot walk, continued N/V.   Per chart pt is very weak. Some N/V/Dry Heaves today and mild Diarrhea.  RD met with pt October 2014 at which time pt reported dry weight of 270 lbs. Today, pt reports maintaining her weight (dry) at 240 lbs. Weight history shows pt has lost 7 lbs within the past 5 days and she has lost 16% of her body weight in the past 6-7 months. Pt reports having poor appetite for several months and only eating 1-2 bites of food at each meal. Pt reports drinking Resource Breeze once daily and taking  Pro-stat once daily for the past week. Pt sipping on ginger ale at time of visit; breakfast tray at bedside untouched. Encouraged pt to sip on breeze supplements throughout the day- mix with ginger ale to improve tolerance.  RD offered additional supplements but, pt not interested at this time.   Labs: low sodium, high BUN, low GFR, elevated creatinine  Pt unable to lift hands or legs for physical exam due to weakness.   Nutrition Focused Physical Exam:  Subcutaneous Fat:  Orbital Region: mild wasting Upper Arm Region: mild wasting Thoracic and Lumbar Region: NA  Muscle:  Temple Region: mild wasting Clavicle Bone Region: mild wasting Clavicle and Acromion Bone Region: mild/moderate wasting Scapular Bone Region: NA Dorsal Hand: moderate wasting Patellar Region: wnl Anterior Thigh Region: mild wasting Posterior Calf Region: wnl  Edema: +2 RLE and LLE edema   Height: Ht Readings from Last 1 Encounters:  04/01/14 _0  (1.676 m)    Weight: Wt Readings from Last 1 Encounters:  04/02/14 229 lb 11.5 oz (104.2 kg)    Ideal Body Weight: 130 lbs  % Ideal Body Weight: 176%  Wt Readings from Last 10 Encounters:  04/02/14 229 lb 11.5 oz (104.2 kg)  03/29/14 236 lb 9.6 oz (107.321 kg)  02/14/14 244 lb (110.678 kg)  09/25/13 274 lb 0.5 oz (124.3 kg)  08/08/13 302 lb (136.986 kg)  04/22/13 280 lb (127.007 kg)  04/22/13 280 lb (127.007 kg)  05/17/12 270 lb (122.471 kg)  11/09/11 256 lb (116.121 kg)    Usual Body Weight: 270 lbs (October  2014)  % Usual Body Weight: 95%  BMI:  Body mass index is 37.1 kg/(m^2).  Estimated Nutritional Needs: Kcal: 2100-2300  Protein: 120 - 130 grams Fluid: 1.2-1.5 L/day  Skin: +1 generalized edema, +2 RLE and LLE edema  Diet Order: General  EDUCATION NEEDS: -No education needs identified at this time   Intake/Output Summary (Last 24 hours) at 04/02/14 1031 Last data filed at 04/02/14 0549  Gross per 24 hour  Intake    298 ml   Output      0 ml  Net    298 ml    Last BM: 4/21   Labs:   Recent Labs Lab 03/27/14 0350 03/28/14 0657 03/29/14 0529 04/01/14 1336 04/02/14 0350  NA 124* 126* 126* 124* 124*  K 4.6 4.5 4.6 5.7* 5.2  CL 90* 95* 92* 90* 91*  CO2 16* 16* 19 17* 15*  BUN 82* 83* 86* 105* 105*  CREATININE 1.89* 1.70* 1.70* 1.80* 1.83*  CALCIUM 8.3* 8.5 8.7 9.3 9.0  MG 2.4 2.5  --   --   --   PHOS 3.0 3.3  --   --   --   GLUCOSE 82 90 91 108* 97    CBG (last 3)  No results found for this basename: GLUCAP,  in the last 72 hours  Scheduled Meds: . sodium chloride   Intravenous STAT  . busPIRone  5 mg Oral QHS  . doxepin  50 mg Oral QHS  . feeding supplement (PRO-STAT SUGAR FREE 64)  30 mL Oral QPC breakfast  . feeding supplement (RESOURCE BREEZE)  1 Container Oral TID WC  . heparin  5,000 Units Subcutaneous 3 times per day  . lactulose  20 g Oral Once per day on Tue Thu Sat  . metoCLOPramide  5 mg Oral BID WC  . pantoprazole  40 mg Oral BID    Continuous Infusions:   Past Medical History  Diagnosis Date  . Exogenous obesity   . Hypertension   . Anxiety and depression     chronic  . Hiatal hernia   . Atypical chest pain 06/10/03    normal 2 day adenosine cardiolite  . Memory changes   . Bipolar disorder   . Ascites   . Cirrhosis   . Anginal pain   . Exertional shortness of breath   . Shortness of breath     "all the time right now" (09/19/2013)  . Anemia   . History of blood transfusion 05/2013    "once; related to a stomach ulcer" (09/19/2013)  . Bleeding stomach ulcer 04/22/2013  . GERD (gastroesophageal reflux disease)   . Osteoarthritis   . Arthritis     "all over my body" (09/19/2013)  . Anxiety   . Depression   . Fatty liver disease, nonalcoholic   . CVA (cerebral infarction) 04/21/13    CCT c/w Left cerebellar infarct which appears subacute to remote    Past Surgical History  Procedure Laterality Date  . Tubal ligation  1977  . Cardiac catheterization  1990     normal cath  . Esophagogastroduodenoscopy N/A 04/22/2013    Procedure: ESOPHAGOGASTRODUODENOSCOPY (EGD);  Surgeon: Lear Ng, MD;  Location[facility] ENDOSCOPY;  Service: Endoscopy;  Laterality: N/A;  . Paracentesis  09/13/2013    "just once" (09/19/2013)  . Carpal tunnel release Bilateral 1990's  . Knee arthroscopy w/ acl reconstruction Bilateral ?1990's  . Vaginal hysterectomy  1980  . Salpingoophorectomy Bilateral 1990's    Pryor Ochoa RD, LDN  Inpatient Clinical Dietitian Pager: [phone] After Hours Pager: [phone]

## 2014-04-03 ENCOUNTER — Inpatient Hospital Stay (HOSPITAL_COMMUNITY): Payer: Medicare Other

## 2014-04-03 LAB — CBC
HEMATOCRIT: 35 % — AB (ref 36.0–46.0)
Hemoglobin: 12 g/dL (ref 12.0–15.0)
MCH: 31.2 pg (ref 26.0–34.0)
MCHC: 34.3 g/dL (ref 30.0–36.0)
MCV: 90.9 fL (ref 78.0–100.0)
Platelets: 120 10*3/uL — ABNORMAL LOW (ref 150–400)
RBC: 3.85 MIL/uL — AB (ref 3.87–5.11)
RDW: 16.5 % — AB (ref 11.5–15.5)
WBC: 9.6 10*3/uL (ref 4.0–10.5)

## 2014-04-03 LAB — BASIC METABOLIC PANEL
BUN: 110 mg/dL — ABNORMAL HIGH (ref 6–23)
CHLORIDE: 93 meq/L — AB (ref 96–112)
CO2: 17 meq/L — AB (ref 19–32)
CREATININE: 2.01 mg/dL — AB (ref 0.50–1.10)
Calcium: 8.7 mg/dL (ref 8.4–10.5)
GFR calc Af Amer: 28 mL/min — ABNORMAL LOW (ref >90)
GFR calc non Af Amer: 24 mL/min — ABNORMAL LOW (ref >90)
Glucose, Bld: 111 mg/dL — ABNORMAL HIGH (ref 70–99)
Potassium: 5.6 mEq/L — ABNORMAL HIGH (ref 3.7–5.3)
Sodium: 126 mEq/L — ABNORMAL LOW (ref 137–147)

## 2014-04-03 LAB — URINE CULTURE: Colony Count: >=100000

## 2014-04-03 MED ORDER — CIPROFLOXACIN HCL 250 MG PO TABS
250.0000 mg | ORAL_TABLET | Freq: Two times a day (BID) | ORAL | Status: DC
  Administered 2014-04-03 – 2014-04-09 (×13): 250 mg via ORAL
  Filled 2014-04-03 (×14): qty 1

## 2014-04-03 MED ORDER — SODIUM CHLORIDE 0.9 % IV SOLN
INTRAVENOUS | Status: AC
  Administered 2014-04-04: 06:00:00 via INTRAVENOUS

## 2014-04-03 NOTE — Clinical Social Work Note (Signed)
CSW met with pt at bedside to discuss discharge disposition. Full assessment to follow. ° °Emily [place], LCSWA °Clinical Social Worker °[phone] °

## 2014-04-03 NOTE — Progress Notes (Signed)
Subjective: Still breathing well. Very Weak - PT recommending SNF Tired. Some N/V/Dry Heaves.  She is slowly meandering.  Objective: Vital signs in last 24 hours: Temp:  [97.6 F (36.4 C)-98.5 F (36.9 C)] 97.6 F (36.4 C) (04/23 0556) Pulse Rate:  [100-102] 101 (04/23 0556) Resp:  [17-18] 17 (04/23 0556) BP: (85-106)/(35-60) 98/60 mmHg (04/23 0600) SpO2:  [95 %-98 %] 96 % (04/23 0556) Weight:  [106.6 kg (235 lb 0.2 oz)] 106.6 kg (235 lb 0.2 oz) (04/23 0556) Weight change: 1.6 kg (3 lb 8.4 oz) Last BM Date: 04/02/14  CBG (last 3)  No results found for this basename: GLUCAP,  in the last 72 hours  Intake/Output from previous day:  Intake/Output Summary (Last 24 hours) at 04/03/14 0723 Last data filed at 04/02/14 2112  Gross per 24 hour  Intake    480 ml  Output      0 ml  Net    480 ml   04/22 0701 - 04/23 0700 In: 480 [P.O.:240; I.V.:240] Out: -    Physical Exam General appearance: Appears tired Eyes: no scleral icterus Throat: oropharynx moist without erythema Resp: CTA Cardio: Reg GI: soft, Obese, Ascites but not tense Extremities: no clubbing, cyanosis or edema Bruises noted General weakness noted.   Lab Results:  Recent Labs  04/02/14 0350 04/03/14 0332  NA 124* 126*  K 5.2 5.6*  CL 91* 93*  CO2 15* 17*  GLUCOSE 97 111*  BUN 105* 110*  CREATININE 1.83* 2.01*  CALCIUM 9.0 8.7     Recent Labs  04/01/14 1336  AST 112*  ALT 64*  ALKPHOS 117  BILITOT 2.7*  PROT 6.5  ALBUMIN 2.6*     Recent Labs  04/01/14 1336 04/02/14 0350 04/03/14 0332  WBC 12.5* 8.6 9.6  NEUTROABS 9.9*  --   --   HGB 13.5 12.3 12.0  HCT 38.1 35.5* 35.0*  MCV 88.8 89.6 90.9  PLT 148* 126* 120*    Lab Results  Component Value Date   INR 1.48 04/01/2014   INR 1.44 03/24/2014   INR 1.42 03/02/2014     Recent Labs  04/01/14 1338  TROPONINI <0.30    No results found for this basename: TSH, T4TOTAL, FREET3, T3FREE, THYROIDAB,  in the last 72 hours  No  results found for this basename: VITAMINB12, FOLATE, FERRITIN, TIBC, IRON, RETICCTPCT,  in the last 72 hours  Micro Results: Recent Results (from the past 240 hour(s))  URINE CULTURE     Status: None   Collection Time    04/01/14  2:51 PM      Result Value Ref Range Status   Specimen Description URINE, CLEAN CATCH   Final   Special Requests ADDED [id number] [id number]   Final   Culture  Setup Time     Final   Value: 04/01/2014 18:46     Performed at [facility]   Colony Count     Final   Value: >=100,000 COLONIES/ML     Performed at [facility]   Culture     Final   Value: GRAM NEGATIVE RODS     Performed at [facility]   Report Status PENDING   Incomplete     Studies/Results: No results found.   Medications: Scheduled: . sodium chloride   Intravenous STAT  . busPIRone  5 mg Oral QHS  . doxepin  50 mg Oral QHS  . feeding supplement (PRO-STAT SUGAR FREE 64)  30 mL Oral QPC breakfast  .  feeding supplement (RESOURCE BREEZE)  1 Container Oral TID WC  . heparin  5,000 Units Subcutaneous 3 times per day  . lactulose  20 g Oral Once per day on Tue Thu Sat  . metoCLOPramide  5 mg Oral BID WC  . multivitamin with minerals  1 tablet Oral Daily  . pantoprazole  40 mg Oral BID   Continuous:    Assessment/Plan: Active Problems:   Hyponatremia   Protein-calorie malnutrition, severe   Liver Failure - Has been slowly progressing to end stage. LFTs and INR have been stable.  Mentally she is fine. Ammonia 68 and on Lactulose.  No Asterixis.   Refractory Ascites - Getting multiple recent Taps and will continue. We have discussed PleurX catheter. May need tap by end of week?  DNR  Progressing towards Hospice/Palliative Care. Not quite there yet   N/V/Dry Heaves - recent KUB was (-).  She is on Reglan/Zofran.  She is able to get some food in.  She is on supplements. Lactate is 3.2 and gut underperfused?  Hyponatremia/Azotemia/DeH - S/P low dose IVF @ 36 hrs and Cr  US actually worse.  Check Renal US.  Continue free water restriction.  Metabolic Acidosis c Lactic Acidosis from Underperfusion - low dose fluids.   Leukocytosis and Urine showed gram (-) Rod Bactiuria - start Cipro renally dose.  Gen Weakness - PT/OT ordered and recommendation of SNF/Rehab   Severe Prot cal Malnutrition. - See nutrition notes  DVT Proph - Hep SQ.  Watch platelets.  Will continue work on gentle hydration and to correct electrolytes and azotemia. I am concerned that after giving 36+ hrs of low dose IVF her Cr is worse and her Na has not budged much.  Also I will increase rate of hydration but by soing so I am increasing the likliehood of her needing a sooner tap. She clearly is in limbo as she is not getting better or worse at moment. Get Renal US to look at kidneys.  Continue PT/OT/SW - Will need SNF. May need tap soon.    LOS: 2 days   Gwen PoundsJohn M Lida Berkery 04/03/2014, 7:23 AM

## 2014-04-04 LAB — BASIC METABOLIC PANEL
BUN: 99 mg/dL — AB (ref 6–23)
CALCIUM: 8.8 mg/dL (ref 8.4–10.5)
CHLORIDE: 92 meq/L — AB (ref 96–112)
CO2: 17 meq/L — AB (ref 19–32)
CREATININE: 1.94 mg/dL — AB (ref 0.50–1.10)
GFR calc Af Amer: 30 mL/min — ABNORMAL LOW (ref >90)
GFR calc non Af Amer: 26 mL/min — ABNORMAL LOW (ref >90)
Glucose, Bld: 83 mg/dL (ref 70–99)
Potassium: 4.4 mEq/L (ref 3.7–5.3)
Sodium: 124 mEq/L — ABNORMAL LOW (ref 137–147)

## 2014-04-04 LAB — CBC
HEMATOCRIT: 34.2 % — AB (ref 36.0–46.0)
Hemoglobin: 11.5 g/dL — ABNORMAL LOW (ref 12.0–15.0)
MCH: 30.7 pg (ref 26.0–34.0)
MCHC: 33.6 g/dL (ref 30.0–36.0)
MCV: 91.2 fL (ref 78.0–100.0)
Platelets: 116 10*3/uL — ABNORMAL LOW (ref 150–400)
RBC: 3.75 MIL/uL — ABNORMAL LOW (ref 3.87–5.11)
RDW: 16.4 % — ABNORMAL HIGH (ref 11.5–15.5)
WBC: 7.6 10*3/uL (ref 4.0–10.5)

## 2014-04-04 MED ORDER — SODIUM CHLORIDE 0.9 % IV SOLN: INTRAVENOUS | Status: AC

## 2014-04-04 NOTE — Clinical Social Work Psychosocial (Signed)
Clinical Social Work Department BRIEF PSYCHOSOCIAL ASSESSMENT 04/04/2014  Patient:  [name], [name]     Account Number:  [id number]     Admit date:  04/01/2014  Clinical Social Worker:  Donna Christen  Date/Time:  04/04/2014 03:35 PM  Referred by:  Physician  Date Referred:  04/04/2014 Referred for  SNF Placement   Other Referral:   none.   Interview type:  Patient Other interview type:   CSW spoke with pt's daughter, [name], as pt stated she was "feeling ill" and deferred decision making to daughter.    PSYCHOSOCIAL DATA Living Status:  FACILITY Admitted from facility:  [facility] Level of care:  Assisted Living Primary support name:  [name] Primary support relationship to patient:  CHILD, ADULT Degree of support available:   Strong support system.    CURRENT CONCERNS Current Concerns  Post-Acute Placement   Other Concerns:   none.    SOCIAL WORK ASSESSMENT / PLAN CSW met with pt at bedside to discuss discharge planning needs. Pt requested CSW speak with pt's daughter regarding discharge disposition.    CSW spoke with pt's daughter, [name], regarding pt's discharge. Per Tamra, pt is a resident at [facility] but family would like for pt to follow PT recommendations with SNF placement for short term rehabilitation. Tamra stated family interest in [facility]. CSW to continue to follow and assist with discharge planning needs.   Assessment/plan status:  Psychosocial Support/Ongoing Assessment of Needs Other assessment/ plan:   none.   Information/referral to community resources:   SNF bed options.    PATIENTS/FAMILYS RESPONSE TO PLAN OF CARE: [name] understanding and agreeable to CSW plan of care. [name] pleasant to speak with.       [name], [employer] Social Worker [phone]

## 2014-04-04 NOTE — Progress Notes (Signed)
Subjective: Still breathing well despite Ab girth and 9# weight gain since admission. We discussed pros and cons of tap today. Very Weak - PT recommending SNF Some N/V/Dry Heaves. Did better eating yesterday  Objective: Vital signs in last 24 hours: Temp:  [97.3 F (36.3 C)-98.2 F (36.8 C)] 98.2 F (36.8 C) (04/24 0521) Pulse Rate:  [86-95] 86 (04/24 0521) Resp:  [18-21] 18 (04/24 0521) BP: (94-100)/(38-52) 100/52 mmHg (04/24 0521) SpO2:  [94 %-98 %] 94 % (04/24 0521) Weight:  [106.4 kg (234 lb 9.1 oz)] 106.4 kg (234 lb 9.1 oz) (04/24 0521) Weight change: -0.2 kg (-7.1 oz) Last BM Date: 04/02/14  CBG (last 3)  No results found for this basename: GLUCAP,  in the last 72 hours  Intake/Output from previous day:  Intake/Output Summary (Last 24 hours) at 04/04/14 0718 Last data filed at 04/03/14 2000  Gross per 24 hour  Intake   1555 ml  Output      0 ml  Net   1555 ml   04/23 0701 - 04/24 0700 In: 1555 [P.O.:1195; I.V.:360] Out: -    Physical Exam General appearance: Appears tired Eyes: no scleral icterus Throat: oropharynx moist without erythema Resp: CTA Cardio: Reg GI: soft, Obese, More Ascites and bigger abdomen. Extremities: no clubbing, cyanosis or edema Bruises noted General weakness noted.   Lab Results:  Recent Labs  04/03/14 0332 04/04/14 0316  NA 126* 124*  K 5.6* 4.4  CL 93* 92*  CO2 17* 17*  GLUCOSE 111* 83  BUN 110* 99*  CREATININE 2.01* 1.94*  CALCIUM 8.7 8.8     Recent Labs  04/01/14 1336  AST 112*  ALT 64*  ALKPHOS 117  BILITOT 2.7*  PROT 6.5  ALBUMIN 2.6*     Recent Labs  04/01/14 1336  04/03/14 0332 04/04/14 0316  WBC 12.5*  < > 9.6 7.6  NEUTROABS 9.9*  --   --   --   HGB 13.5  < > 12.0 11.5*  HCT 38.1  < > 35.0* 34.2*  MCV 88.8  < > 90.9 91.2  PLT 148*  < > 120* 116*  < > = values in this interval not displayed.  Lab Results  Component Value Date   INR 1.48 04/01/2014   INR 1.44 03/24/2014   INR 1.42  03/02/2014     Recent Labs  04/01/14 1338  TROPONINI <0.30    No results found for this basename: TSH, T4TOTAL, FREET3, T3FREE, THYROIDAB,  in the last 72 hours  No results found for this basename: VITAMINB12, FOLATE, FERRITIN, TIBC, IRON, RETICCTPCT,  in the last 72 hours  Micro Results: Recent Results (from the past 240 hour(s))  URINE CULTURE     Status: None   Collection Time    04/01/14  2:51 PM      Result Value Ref Range Status   Specimen Description URINE, CLEAN CATCH   Final   Special Requests ADDED [id number]   Final   Culture  Setup Time     Final   Value: 04/01/2014 18:46     Performed at [facility]   Colony Count     Final   Value: >=100,000 COLONIES/ML     Performed at [facility]   Culture     Final   Value: ESCHERICHIA COLI     Performed at [facility]   Report Status 04/03/2014 FINAL   Final   Organism ID, Bacteria ESCHERICHIA COLI   Final  Studies/Results: [place] Renal  04/03/2014   CLINICAL DATA:  Worsening creatinine  EXAM: RENAL/URINARY TRACT ULTRASOUND COMPLETE  COMPARISON:  CT abdomen pelvis dated 10/31/2013  FINDINGS: Right Kidney:  Length: 11.9 cm. Echogenic renal parenchyma with cortical thinning, suggesting medical renal disease. No mass or hydronephrosis.  Left Kidney:  Length: 11.0 cm. Echogenic renal parenchyma with cortical thinning, suggesting medical renal disease. No mass or hydronephrosis.  Bladder:  Within normal limits.  Additional comments:  Ascites.  IMPRESSION: Echogenic renal parenchyma with cortical thinning, suggesting medical renal disease.  No hydronephrosis.  Ascites.   Electronically Signed   By: Charline BillsSriyesh  Krishnan M.D.   On: 04/03/2014 10:41     Medications: Scheduled: . busPIRone  5 mg Oral QHS  . ciprofloxacin  250 mg Oral BID  . doxepin  50 mg Oral QHS  . feeding supplement (PRO-STAT SUGAR FREE 64)  30 mL Oral QPC breakfast  . feeding supplement (RESOURCE BREEZE)  1 Container Oral TID WC  .  heparin  5,000 Units Subcutaneous 3 times per day  . lactulose  20 g Oral Once per day on Tue Thu Sat  . metoCLOPramide  5 mg Oral BID WC  . multivitamin with minerals  1 tablet Oral Daily  . pantoprazole  40 mg Oral BID   Continuous: . sodium chloride 45 mL/hr at 04/04/14 [id number]     Assessment/Plan: Active Problems:   Hyponatremia   Protein-calorie malnutrition, severe   Liver Failure - Has been slowly progressing towards end stage. LFTs and INR have been stable.  Mentally she is fine. Ammonia 68 and on Lactulose.  No Asterixis.   Refractory Ascites - Getting multiple recent Taps and will continue. We have discussed PleurX catheter. May need tap today or Monday.  She is comfortable lying flat.  We decided to wait till Monday unless she gets worse over the weekend.  Dr Felipa EthAvva to eval.  DNR  Progressing towards Hospice/Palliative Care. Not quite there yet   N/V/Dry Heaves - recent KUB was (-).  She is on Reglan/Zofran.  She is able to get some food in.  She is on supplements. Lactate is 3.2 and gut underperfused?  Hyponatremia/Azotemia/DeH - Continues on low dose IVF and Cr better than yesterday.  Worse than admit though.  Na still off.   Renal US showed medical renal Dz but nothing reversible or surprising.  Continue free water restriction.Cr went from 1.83 to 2.01 to 1.94  Metabolic Acidosis c Lactic Acidosis from Underperfusion - low dose fluids.   Leukocytosis and Urine Cx showed Pansenesitive E coli UTI - Cipro day 2 renally dose.  Gen Weakness - PT/OT ordered and recommendation of SNF/Rehab   Severe Prot cal Malnutrition. - See nutrition notes  DVT Proph - Hep SQ.  Watch platelets.  I explained 2 options - continue slow IVF hydration/monitoring and work towrds SNF by Mon/Tues but may need Paracentesis on Monday or.. Do a paracentesis today and follow c Albumin and a more aggressive hydration approach.  The second approach may cause more fluid shifts and cause more short/long  term issues?? She clearly has gained weight and Ab girth is worse - She will need tap soon.  Still trying to work on gentle hydration and to correct electrolytes and azotemia. I am concerned that after giving 60+ hrs of low dose IVF her Cr and Na has not budged much.   She clearly is in limbo as she is not getting better or worse at moment. FL2 was not on  the chart for signature.  May need tap soon.    LOS: 3 days   Gwen Pounds 04/04/2014, 7:18 AM

## 2014-04-04 NOTE — Clinical Social Work Placement (Addendum)
Clinical Social Work Department CLINICAL SOCIAL WORK PLACEMENT NOTE 04/04/2014  Patient:  [name],[name]  Account Number:  [id number] Admit date:  04/01/2014  Clinical Social Worker:  Irving BurtonEMILY SUMMERVILLE, LCSWA  Date/time:  04/04/2014 03:40 PM  Clinical Social Work is seeking post-discharge placement for this patient at the following level of care:   SKILLED NURSING   (*CSW will update this form in Epic as items are completed)   04/04/2014  Patient/family provided with [facility] Department of Clinical Social Works list of facilities offering this level of care within the geographic area requested by the patient (or if unable, by the patients family).  04/04/2014  Patient/family informed of their freedom to choose among providers that offer the needed level of care, that participate in Medicare, Medicaid or managed care program needed by the patient, have an available bed and are willing to accept the patient.  04/04/2014  Patient/family informed of [facility] ownership interest in [facility], as well as of the fact that they are under no obligation to receive care at this facility.  PASARR submitted to EDS on  PASARR number received from EDS on   FL2 transmitted to all facilities in geographic area requested by pt/family on  04/04/2014 FL2 transmitted to all facilities within larger geographic area on   Patient informed that his/her managed care company has contracts with or will negotiate with  certain facilities, including the following:     Patient/family informed of bed offers received:  04/09/2014 Patient chooses bed at  [facility] with hospice following Physician recommends and patient chooses bed at    Patient to be transferred to  on  04/09/2014 Patient to be transferred to facility by  [facility]  The following physician request were entered in Epic:   Additional Comments:  Darlyn ChamberEmily Summerville, Theresia MajorsLCSWA Clinical Social Worker [phone]

## 2014-04-04 NOTE — Progress Notes (Signed)
Occupational Therapy Treatment Patient Details Name: Elizabeth BongCheryl D Jacobson MRN: [id number] DOB: Jun 02, 1946 Today's Date: 04/04/2014    History of present illness [age] F c known progressive liver failure s/p admission last week c refractory ascites, ARF, and electrolyte issues. We tapped off 15+ liters and she was ready for D/c on Friday. She had N/v and was ill so it was postponed till Sat. [facility] she has been very weak. Per MD note pt progressing towards hospice/palliative care consult secondary to end stage liver failure.     OT comments  This   Follow Up Recommendations  SNF    Equipment Recommendations  None recommended by OT       Precautions / Restrictions Precautions Precautions: Fall       Mobility Bed Mobility Overal bed mobility: Needs Assistance;+2 for physical assistance Bed Mobility: Rolling;Sidelying to Sit;Sit to Sidelying Rolling: Mod assist;+2 for physical assistance (easier rolling to the R) Sidelying to sit: Mod assist;+2 for physical assistance     Sit to sidelying: Total assist;+2 for physical assistance General bed mobility comments: multiple rolls throughout session with cuing and truncal assist; significant truncal assist R sidelying to sit, but with good pt participation  Transfers Overall transfer level: Needs assistance Equipment used:  (chairback) Transfers: Sit to/from Stand Sit to Stand: Min assist;+2 safety/equipment (times 3)         General transfer comment: no longer requiring lift pad.  Pt came to full erect stance x3 and stood  15 to over 30 secs;  Assist to come forward more than lifting assist    Balance Overall balance assessment: Needs assistance Sitting-balance support: Feet supported;No upper extremity supported Sitting balance-Leahy Scale: Fair Sitting balance - Comments: sat more than 12 min EOB in between standing trial and while getting BP's   Standing balance support: During functional  activity;Bilateral upper extremity supported Standing balance-Leahy Scale: Poor Standing balance comment: used support of Recliner chairback.  Stood with ease fully upright, but not ready to go without support.  Stood 3 times for approx 15 to over 30 secs at most                   ADL Overall ADL's : Needs assistance/impaired                         Toilet Transfer: +2 for safety/equipment;Minimal assistance (sit<>stand from bed x3 (could not do the transfer to due dizziness))           Functional mobility during ADLs: +2 for safety/equipment;Minimal assistance General ADL Comments: Pt getting dizzy quite quickly once went from sit>stand at EOB (not supine to sit)--see addtional section in PN for BPs                Cognition   Behavior During Therapy: [facility] for tasks assessed/performed Overall Cognitive Status: Within Functional Limits for tasks assessed                         Exercises Low Level/ICU Exercises Ankle Circles/Pumps: AROM;Both;10 reps;Supine           Pertinent Vitals/ Pain       BP sitting 90/53; BP sitting after attempt to take in standing (could not tolerate long enough) 89/32          Frequency Min 2X/week     Progress Toward Goals  OT Goals(current goals can now be found in the care  plan section)  Progress towards OT goals: Progressing toward goals  Acute Rehab OT Goals Patient Stated Goal: to return to ALF  Plan Discharge plan remains appropriate    Co-evaluation    PT/OT/SLP Co-Evaluation/Treatment: Yes Reason for Co-Treatment: For patient/therapist safety   OT goals addressed during session: ADL's and self-care;Strengthening/ROM         Activity Tolerance  (limited by dizziness when standing)   Patient Left in bed;with call bell/phone within reach   Nurse Communication  (Nursing secretary--pt needed to use the bedpan)        Time: [id number] OT Time Calculation (min): 27 min  Charges: OT General  Charges $OT Visit: 1 Procedure OT Treatments $Self Care/Home Management : 8-22 mins  Evette GeorgesCatherine Eva Orean Giarratano [phone] 04/04/2014, 5:34 PM

## 2014-04-04 NOTE — Progress Notes (Signed)
Physical Therapy Treatment Patient Details Name: [name] MRN: [id number] DOB: 03/04/1946 Today's Date: 04/04/2014    History of Present Illness [age] F c known progressive liver failure s/p admission last week c refractory ascites, ARF, and electrolyte issues. We tapped off 15+ liters and she was ready for D/c on Friday. She had N/v and was ill so it was postponed till Sat. [facility] weak. Per MD note pt progressing towards hospice/palliative care consult secondary to end stage liver failure.      PT Comments    Much improved mobility today incl. Especially standing at EOB with min assist and 2 person for safety.  Unable to get to chair due to BP dropping each standing trial.  Follow Up Recommendations  SNF;Supervision/Assistance - 24 hour     Equipment Recommendations  None recommended by PT    Recommendations for Other Services       Precautions / Restrictions Precautions Precautions: Fall    Mobility  Bed Mobility Overal bed mobility: Needs Assistance;+2 for physical assistance Bed Mobility: Rolling;Sidelying to Sit;Sit to Sidelying Rolling: Mod assist;+2 for physical assistance (easier rolling to the R) Sidelying to sit: Mod assist;+2 for physical assistance     Sit to sidelying: Total assist;+2 for physical assistance General bed mobility comments: multiple rolls throughout session with cuing and truncal assist; significant truncal assist R sidelying to sit, but with good pt participation  Transfers Overall transfer level: Needs assistance Equipment used:  (chairback) Transfers: Sit to/from Stand Sit to Stand: Min assist;+2 safety/equipment (times 3)         General transfer comment: no longer requiring lift pad.  Pt came to full erect stance x3 and stood  15 to over 30 secs;  Assist to come forward more than lifting assist  Ambulation/Gait                 Stairs            Wheelchair Mobility     Modified Rankin (Stroke Patients Only)       Balance Overall balance assessment: Needs assistance Sitting-balance support: Feet supported;No upper extremity supported Sitting balance-Leahy Scale: Fair Sitting balance - Comments: sat more than 12 min EOB in between standing trial and while getting BP's   Standing balance support: During functional activity;Bilateral upper extremity supported Standing balance-Leahy Scale: Poor Standing balance comment: used support of Recliner chairback.  Stood with ease fully upright, but not ready to go without support.  Stood 3 times for approx 15 to over 30 secs at most                    Cognition Arousal/Alertness: Awake/alert Behavior During Therapy: [facility] for tasks assessed/performed;Anxious Overall Cognitive Status: Within Functional Limits for tasks assessed                      Exercises Low Level/ICU Exercises Ankle Circles/Pumps: AROM;Both;10 reps;Supine    General Comments        Pertinent Vitals/Pain BP sitting 90/53;  BP sitting after attempt to take in standing (without pt last for BP reading)  89/32    Home Living                      Prior Function            PT Goals (current goals can now be found in the care plan section) Acute Rehab PT Goals  Patient Stated Goal: to return to ALF PT Goal Formulation: With patient Time For Goal Achievement: 04/16/14 Potential to Achieve Goals: Fair Progress towards PT goals: Progressing toward goals    Frequency  Min 2X/week    PT Plan Current plan remains appropriate    Co-evaluation             End of Session   Activity Tolerance: Patient tolerated treatment well;Other (comment) (transfer to chair limited by low BP's) Patient left: in bed;with call bell/phone within reach     Time: 9604-54091615-1642 PT Time Calculation (min): 27 min  Charges:  $Therapeutic Activity: 23-37 mins                    G CodesEliseo Gum:      Etoy Mcdonnell V  Takeya Marquis 04/04/2014, 5:13 PM 04/04/2014  Hassell BingKen Necha Harries, PT [phone]  (pager)

## 2014-04-05 ENCOUNTER — Inpatient Hospital Stay (HOSPITAL_COMMUNITY): Payer: Medicare Other

## 2014-04-05 LAB — BASIC METABOLIC PANEL
BUN: 105 mg/dL — AB (ref 6–23)
CALCIUM: 8.8 mg/dL (ref 8.4–10.5)
CO2: 15 mEq/L — ABNORMAL LOW (ref 19–32)
Chloride: 91 mEq/L — ABNORMAL LOW (ref 96–112)
Creatinine, Ser: 2.01 mg/dL — ABNORMAL HIGH (ref 0.50–1.10)
GFR calc Af Amer: 28 mL/min — ABNORMAL LOW (ref >90)
GFR calc non Af Amer: 24 mL/min — ABNORMAL LOW (ref >90)
Glucose, Bld: 89 mg/dL (ref 70–99)
Potassium: 5.1 mEq/L (ref 3.7–5.3)
Sodium: 123 mEq/L — ABNORMAL LOW (ref 137–147)

## 2014-04-05 LAB — CBC
HEMATOCRIT: 35.3 % — AB (ref 36.0–46.0)
Hemoglobin: 12.3 g/dL (ref 12.0–15.0)
MCH: 31.1 pg (ref 26.0–34.0)
MCHC: 34.8 g/dL (ref 30.0–36.0)
MCV: 89.1 fL (ref 78.0–100.0)
PLATELETS: 113 10*3/uL — AB (ref 150–400)
RBC: 3.96 MIL/uL (ref 3.87–5.11)
RDW: 15.9 % — AB (ref 11.5–15.5)
WBC: 8.8 10*3/uL (ref 4.0–10.5)

## 2014-04-05 LAB — GLUCOSE, CAPILLARY: GLUCOSE-CAPILLARY: 194 mg/dL — AB (ref 70–99)

## 2014-04-05 MED ORDER — ALBUMIN HUMAN 25 % IV SOLN
50.0000 g | Freq: Once | INTRAVENOUS | Status: AC
  Administered 2014-04-05: 50 g via INTRAVENOUS
  Filled 2014-04-05: qty 200

## 2014-04-05 NOTE — Progress Notes (Signed)
Subjective: When asked, patient states that she feels about the same as yesterday however significant gain in weight, breathing okay at a slight inclination, appetite marginal, physical therapy recommending skilled nursing facility, continues to be tired, and would appreciate paracentesis to make her feel better given improvement symptomatically with past taps. Admits to continued nausea no vomiting today, some dry heaves. Mental status stable and appears appropriate, patient does not admit to any confusion. Weight from this morning is not in the chart yet however patient has gained 22 pounds from 4/23 through 4/24.   Objective: Vital signs in last 24 hours: Temp:  [97.3 F (36.3 C)-97.6 F (36.4 C)] 97.3 F (36.3 C) (04/25 0644) Pulse Rate:  [88-95] 88 (04/25 0644) Resp:  [18-20] 20 (04/25 0644) BP: (89-102)/(32-53) 98/37 mmHg (04/25 0644) SpO2:  [98 %-100 %] 100 % (04/25 0644) Weight change:  Last BM Date: 04/03/14  CBG (last 3)  No results found for this basename: GLUCAP,  in the last 72 hours  Intake/Output from previous day:  Intake/Output Summary (Last 24 hours) at 04/05/14 1020 Last data filed at 04/05/14 0500  Gross per 24 hour  Intake  579.5 ml  Output      0 ml  Net  579.5 ml   04/24 0701 - 04/25 0700 In: 819.5 [P.O.:600; I.V.:219.5] Out: -    Physical Exam General appearance: Appears tired but appropriate answering questions appropriately Eyes: no scleral icterus Throat: oropharynx moist without erythema Neck supple Resp: CTA Cardio: Reg GI: soft, Obese, Ascites but not tense the patient appears uncomfortable Extremities: no clubbing, cyanosis or edema Bruises noted General weakness noted.   Lab Results:  Recent Labs  04/04/14 0316 04/05/14 0635  NA 124* 123*  K 4.4 5.1  CL 92* 91*  CO2 17* 15*  GLUCOSE 83 89  BUN 99* 105*  CREATININE 1.94* 2.01*  CALCIUM 8.8 8.8    No results found for this basename: AST, ALT, ALKPHOS, BILITOT, PROT, ALBUMIN,   in the last 72 hours   Recent Labs  04/04/14 0316 04/05/14 0635  WBC 7.6 8.8  HGB 11.5* 12.3  HCT 34.2* 35.3*  MCV 91.2 89.1  PLT 116* 113*    Lab Results  Component Value Date   INR 1.48 04/01/2014   INR 1.44 03/24/2014   INR 1.42 03/02/2014    No results found for this basename: CKTOTAL, CKMB, CKMBINDEX, TROPONINI,  in the last 72 hours  No results found for this basename: TSH, T4TOTAL, FREET3, T3FREE, THYROIDAB,  in the last 72 hours  No results found for this basename: VITAMINB12, FOLATE, FERRITIN, TIBC, IRON, RETICCTPCT,  in the last 72 hours  Micro Results: Recent Results (from the past 240 hour(s))  URINE CULTURE     Status: None   Collection Time    04/01/14  2:51 PM      Result Value Ref Range Status   Specimen Description URINE, CLEAN CATCH   Final   Special Requests ADDED [id number]   Final   Culture  Setup Time     Final   Value: 04/01/2014 18:46     Performed at [facility]   Colony Count     Final   Value: >=100,000 COLONIES/ML     Performed at [facility]   Culture     Final   Value: ESCHERICHIA COLI     Performed at [facility]   Report Status 04/03/2014 FINAL   Final   Organism ID, Bacteria ESCHERICHIA COLI  Final     Studies/Results: No results found.   Medications: Scheduled: . busPIRone  5 mg Oral QHS  . ciprofloxacin  250 mg Oral BID  . doxepin  50 mg Oral QHS  . feeding supplement (PRO-STAT SUGAR FREE 64)  30 mL Oral QPC breakfast  . feeding supplement (RESOURCE BREEZE)  1 Container Oral TID WC  . heparin  5,000 Units Subcutaneous 3 times per day  . lactulose  20 g Oral Once per day on Tue Thu Sat  . metoCLOPramide  5 mg Oral BID WC  . multivitamin with minerals  1 tablet Oral Daily  . pantoprazole  40 mg Oral BID   Continuous:    Assessment/Plan: Active Problems:   Hyponatremia   Protein-calorie malnutrition, severe   Liver Failure - Has been slowly progressing to end stage. LFTs and INR have  been stable.  Mentally she is fine. Last Ammonia 68 and on Lactulose.  No Asterixis. Patient appears stable today and answering questions appropriate  Refractory Ascites - Getting multiple recent Taps and will continue. We have discussed PleurX catheter. Given significant weight gain, and worsening sodium levels, we'll re\re tap today, discussed with, will follow with 50 g of albumin as performed previously  DNR  Progressing towards Hospice/Palliative Care. Not quite there yet   N/V/Dry Heaves - recent KUB was (-).  She is on Reglan/Zofran.  She is able to get some food in.  She is on supplements. Lactate is 3.2 and gut underperfused?  Hyponatremia/Azotemia/DeH - S/P low dose IVF @ greater than 2-3 days, will continue to monitor, creatinine inching up to 2.01, sodium slightly lower  Continue free water restriction. Ultrasound consistent with medical renal disease, cortical thinning, no hydronephrosis but positive for ascites on 4/23  Metabolic Acidosis c Lactic Acidosis from Underperfusion - low dose fluids.   Leukocytosis and Urine showed gram (-) Rod Bactiuria - Escherichia coli sensitive to Cipro Cipro renally dose.  Gen Weakness - PT/OT ordered and recommendation of SNF/Rehab   Severe Prot cal Malnutrition. - See nutrition notes  DVT Proph - Hep SQ.  Watch platelets.  Will continue work on gentle hydration and to correct electrolytes and azotemia. I am concerned that after giving 60+ hrs of low dose IVF her Cr is worse and her Na has not budged much.  Also I will increase rate of hydration but by soing so I am increasing the likliehood of her needing a sooner tap. She clearly is in limbo as she is not getting better or worse at moment. Renal ultrasound consistent with medical renal disease no hydronephrosis ascites present  Continue PT/OT/SW - Will need SNF. Will order paracentesis today given weight gain    LOS: 4 days   Oryan Winterton R Jaxden Blyden 04/05/2014, 10:20 AM

## 2014-04-06 LAB — BASIC METABOLIC PANEL
BUN: 98 mg/dL — AB (ref 6–23)
BUN: 103 mg/dL — AB (ref 6–23)
CALCIUM: 8.5 mg/dL (ref 8.4–10.5)
CO2: 16 mEq/L — ABNORMAL LOW (ref 19–32)
CO2: 15 mEq/L — ABNORMAL LOW (ref 19–32)
Calcium: 8.7 mg/dL (ref 8.4–10.5)
Chloride: 89 mEq/L — ABNORMAL LOW (ref 96–112)
Chloride: 87 mEq/L — ABNORMAL LOW (ref 96–112)
Creatinine, Ser: 1.95 mg/dL — ABNORMAL HIGH (ref 0.50–1.10)
Creatinine, Ser: 2.06 mg/dL — ABNORMAL HIGH (ref 0.50–1.10)
GFR calc Af Amer: 28 mL/min — ABNORMAL LOW (ref >90)
GFR, EST AFRICAN AMERICAN: 29 mL/min — AB (ref >90)
GFR, EST NON AFRICAN AMERICAN: 25 mL/min — AB (ref >90)
GFR, EST NON AFRICAN AMERICAN: 24 mL/min — AB (ref >90)
GLUCOSE: 161 mg/dL — AB (ref 70–99)
Glucose, Bld: 60 mg/dL — ABNORMAL LOW (ref 70–99)
POTASSIUM: 4.5 meq/L (ref 3.7–5.3)
Potassium: 4.2 mEq/L (ref 3.7–5.3)
Sodium: 120 mEq/L — CL (ref 137–147)
Sodium: 117 mEq/L — CL (ref 137–147)

## 2014-04-06 LAB — CBC
HCT: 30.6 % — ABNORMAL LOW (ref 36.0–46.0)
Hemoglobin: 10.5 g/dL — ABNORMAL LOW (ref 12.0–15.0)
MCH: 31.1 pg (ref 26.0–34.0)
MCHC: 34.3 g/dL (ref 30.0–36.0)
MCV: 90.5 fL (ref 78.0–100.0)
Platelets: 80 10*3/uL — ABNORMAL LOW (ref 150–400)
RBC: 3.38 MIL/uL — ABNORMAL LOW (ref 3.87–5.11)
RDW: 16.2 % — ABNORMAL HIGH (ref 11.5–15.5)
WBC: 5.8 10*3/uL (ref 4.0–10.5)

## 2014-04-06 LAB — GLUCOSE, CAPILLARY
GLUCOSE-CAPILLARY: 148 mg/dL — AB (ref 70–99)
Glucose-Capillary: 67 mg/dL — ABNORMAL LOW (ref 70–99)
Glucose-Capillary: 121 mg/dL — ABNORMAL HIGH (ref 70–99)

## 2014-04-06 MED ORDER — SODIUM BICARBONATE 8.4 % IV SOLN
INTRAVENOUS | Status: DC
  Administered 2014-04-06 – 2014-04-08 (×3): via INTRAVENOUS
  Filled 2014-04-06 (×4): qty 150

## 2014-04-06 NOTE — Progress Notes (Signed)
Hypoglycemic Event  CBG: 67    Treatment: 15 GM carbohydrate snack  Symptoms: None  Follow-up CBG: Time:0900 CBG Result:121   Possible Reasons for Event: Unknown  Comments/MD notified:Night RN, Mechele CollinKaren Young, notified MD.     Elizabeth OaksLindsey B Nilda Jacobson  Remember to initiate Hypoglycemia Order Set & complete

## 2014-04-06 NOTE — Progress Notes (Signed)
Subjective: Patient feels better compared with 24 hours go status post removing approximately 7.5 L paracentesis, denies shortness breath, resting flat, denies fevers, chills, some subjective abdominal pain which he states is typical after paracentesis. Daughter present, discussed labs from this morning. Did eat breakfast, one episode of hypoglycemia this morning resolved after eating. Asymptomatic with no mental status changes. Appropriate answering questions   patient does not admit to any confusion.  Objective: Vital signs in last 24 hours: Temp:  [97.3 F (36.3 C)-98 F (36.7 C)] 97.8 F (36.6 C) (04/26 0600) Pulse Rate:  [90-96] 90 (04/26 0600) Resp:  [17-18] 18 (04/26 0600) BP: (86-117)/(29-104) 112/43 mmHg (04/26 0600) SpO2:  [92 %-100 %] 100 % (04/26 0600) Weight:  [117.574 kg (259 lb 3.3 oz)] 117.574 kg (259 lb 3.3 oz) (04/26 0600) Weight change:  Last BM Date: 04/05/14 Weight up 2 pounds, question accuracy given paracentesis of 7.2 L CBG (last 3)   Recent Labs  04/05/14 2229 04/06/14 0736 04/06/14 0903  GLUCAP 194* 67* 121*    Intake/Output from previous day:  Intake/Output Summary (Last 24 hours) at 04/06/14 1155 Last data filed at 04/05/14 2216  Gross per 24 hour  Intake     50 ml  Output    100 ml  Net    -50 ml   04/25 0701 - 04/26 0700 In: 50 [P.O.:50] Out: 100 [Urine:100]   Physical Exam General appearance: Appears tired but appropriate answering questions appropriately, discussed lab results Eyes: no scleral icterus Throat: oropharynx moist without erythema Neck supple Resp: CTA Cardio: Reg GI: soft, Obese, Ascites but not tense the patient appears uncomfortable Extremities: no clubbing, cyanosis or edema Bruises noted General weakness noted.   Lab Results:  Recent Labs  04/05/14 0635 04/06/14 0314  NA 123* 120*  K 5.1 4.5  CL 91* 89*  CO2 15* 16*  GLUCOSE 89 60*  BUN 105* 98*  CREATININE 2.01* 1.95*  CALCIUM 8.8 8.7    No results  found for this basename: AST, ALT, ALKPHOS, BILITOT, PROT, ALBUMIN,  in the last 72 hours   Recent Labs  04/05/14 0635 04/06/14 0314  WBC 8.8 5.8  HGB 12.3 10.5*  HCT 35.3* 30.6*  MCV 89.1 90.5  PLT 113* 80*    Lab Results  Component Value Date   INR 1.48 04/01/2014   INR 1.44 03/24/2014   INR 1.42 03/02/2014    No results found for this basename: CKTOTAL, CKMB, CKMBINDEX, TROPONINI,  in the last 72 hours  No results found for this basename: TSH, T4TOTAL, FREET3, T3FREE, THYROIDAB,  in the last 72 hours  No results found for this basename: VITAMINB12, FOLATE, FERRITIN, TIBC, IRON, RETICCTPCT,  in the last 72 hours  Micro Results: Recent Results (from the past 240 hour(s))  URINE CULTURE     Status: None   Collection Time    04/01/14  2:51 PM      Result Value Ref Range Status   Specimen Description URINE, CLEAN CATCH   Final   Special Requests ADDED [id number]   Final   Culture  Setup Time     Final   Value: 04/01/2014 18:46     Performed at [facility]   Colony Count     Final   Value: >=100,000 COLONIES/ML     Performed at [facility]   Culture     Final   Value: ESCHERICHIA COLI     Performed at [facility]   Report Status  04/03/2014 FINAL   Final   Organism ID, Bacteria ESCHERICHIA COLI   Final     Studies/Results: [place] Paracentesis  04/05/2014   CLINICAL DATA:  Cirrhosis, recurrent ascites. Request therapeutic paracentesis  EXAM: ULTRASOUND GUIDED PARACENTESIS  COMPARISON:  Previous paracentesis  PROCEDURE: An ultrasound guided paracentesis was thoroughly discussed with the patient and questions answered. The benefits, risks, alternatives and complications were also discussed. The patient understands and wishes to proceed with the procedure. Written consent was obtained.  Ultrasound was performed to localize and mark an adequate pocket of fluid in the left lower quadrant of the abdomen. The area was then prepped and draped in the normal  sterile fashion. 1% Lidocaine was used for local anesthesia. Under ultrasound guidance a 19 gauge Yueh catheter was introduced. Paracentesis was performed. The catheter was removed and a dressing applied.  COMPLICATIONS: None immediate  FINDINGS: A total of approximately 7.2 L of clear yellow fluid was removed. A fluid sample was not sent for laboratory analysis.  IMPRESSION: Successful ultrasound guided paracentesis yielding 7.2 L of ascites.  Read by: Brayton ElKevin Bruning PA-C   Electronically Signed   By: Simonne ComeJohn  Watts M.D.   On: 04/05/2014 12:40     Medications: Scheduled: . busPIRone  5 mg Oral QHS  . ciprofloxacin  250 mg Oral BID  . doxepin  50 mg Oral QHS  . feeding supplement (PRO-STAT SUGAR FREE 64)  30 mL Oral QPC breakfast  . feeding supplement (RESOURCE BREEZE)  1 Container Oral TID WC  . heparin  5,000 Units Subcutaneous 3 times per day  . lactulose  20 g Oral Once per day on Tue Thu Sat  . metoCLOPramide  5 mg Oral BID WC  . multivitamin with minerals  1 tablet Oral Daily  . pantoprazole  40 mg Oral BID   Continuous:    Assessment/Plan: Active Problems:   Hyponatremia   Protein-calorie malnutrition, severe   Liver Failure - Has been slowly progressing to end stage. LFTs and INR have been stable.  Mentally she is fine. Last Ammonia 68 and on Lactulose.  No Asterixis. Patient appears stable today and answering questions appropriate  Refractory Ascites - Getting multiple recent Taps and will continue. We have discussed PleurX catheter. 7.2 L removed yesterday, sodium level worse this morning, albumin administered, will recheck as below  DNR  Progressing towards Hospice/Palliative Care. Not quite there yet   N/V/Dry Heaves - recent KUB was (-).  She is on Reglan/Zofran.  She is able to get some food in.  She is on supplements. Lactate is 3.2 and gut underperfused?  Hyponatremia/Azotemia/DeH - S/P low dose IVF @ greater than 3-4 days, will continue to monitor, creatinine stable at  1.95, sodium slightly lower at 120 down from 123  Continue free water restriction. Ultrasound consistent with medical renal disease, cortical thinning, no hydronephrosis but positive for ascites on 4/23 home we'll recheck basic metabolic panel this evening, however not sure what can be done with the possibility of administering saline at higher levels resulting in more ascites  Metabolic Acidosis c Lactic Acidosis from Underperfusion - low dose fluids.   Leukocytosis and Urine showed gram (-) Rod Bactiuria - Escherichia coli sensitive to Cipro Cipro renally dose.  Gen Weakness - PT/OT ordered and recommendation of SNF/Rehab   Severe Prot cal Malnutrition. - See nutrition notes  DVT Proph - Hep SQ.  Watch platelets.  Will continue work on gentle hydration and to correct electrolytes and azotemia. I am concerned that  after giving 60+ hrs of low dose IVF her Cr is worse and her Na has not budged much.  If we increase rate of hydration but by soing so I am increasing the likliehood of her needing a sooner tap. She clearly is in limbo as she is not getting better or worse at moment. Renal ultrasound consistent with medical renal disease no hydronephrosis ascites present  Continue PT/OT/SW - Will need SNF. Question validity of weight measurements given withdrawal 7.2 L yesterday    LOS: 5 days   Rolando Whitby R Kela Baccari 04/06/2014, 11:55 AM

## 2014-04-06 NOTE — Progress Notes (Signed)
Relayed Na level results to Dr. Felipa EthAvva. Pt. With end stage liver disease. Pt. Is comfortable in bed, no complaits. CBG this am is 1867- Dr. Felipa EthAvva also aware. Will recheck CBG after breakfast. Will continue to monitor.  KYoung RN

## 2014-04-06 NOTE — Progress Notes (Addendum)
Per Toniann FailWendy weekend [facility] to [facility] Monday [facility]/14. Clinical Child psychotherapistocial Worker (CSW) left message with patient's daughter Barrie Dunkeramra and made patient aware of above. Patient thanked CSW and reported that she would be prepared for D/C to [facility] on Monday.   Jetta LoutBailey Morgan, Theresia MajorsLCSWA Weekend CSW [phone]  Patient's daughter Barrie Dunkeramra called CSW back. CSW made Tamra aware that patient can transfer to [facility] on Monday. Daughter reported that she would like patient to go via EMS. Weekday CSW will assist with D/C.   Jetta LoutBailey Morgan, LCSWA Weekend CSW [phone]

## 2014-04-07 LAB — GLUCOSE, CAPILLARY
Glucose-Capillary: 73 mg/dL (ref 70–99)
Glucose-Capillary: 167 mg/dL — ABNORMAL HIGH (ref 70–99)
Glucose-Capillary: 162 mg/dL — ABNORMAL HIGH (ref 70–99)

## 2014-04-07 LAB — CBC
HEMATOCRIT: 30.9 % — AB (ref 36.0–46.0)
Hemoglobin: 10.9 g/dL — ABNORMAL LOW (ref 12.0–15.0)
MCH: 31.1 pg (ref 26.0–34.0)
MCHC: 35.3 g/dL (ref 30.0–36.0)
MCV: 88.3 fL (ref 78.0–100.0)
Platelets: 98 10*3/uL — ABNORMAL LOW (ref 150–400)
RBC: 3.5 MIL/uL — ABNORMAL LOW (ref 3.87–5.11)
RDW: 16.1 % — AB (ref 11.5–15.5)
WBC: 8.2 10*3/uL (ref 4.0–10.5)

## 2014-04-07 LAB — BASIC METABOLIC PANEL
BUN: 106 mg/dL — AB (ref 6–23)
CO2: 15 mEq/L — ABNORMAL LOW (ref 19–32)
CREATININE: 2.32 mg/dL — AB (ref 0.50–1.10)
Calcium: 8.6 mg/dL (ref 8.4–10.5)
Chloride: 89 mEq/L — ABNORMAL LOW (ref 96–112)
GFR, EST AFRICAN AMERICAN: 24 mL/min — AB (ref >90)
GFR, EST NON AFRICAN AMERICAN: 21 mL/min — AB (ref >90)
Glucose, Bld: 73 mg/dL (ref 70–99)
Potassium: 4.5 mEq/L (ref 3.7–5.3)
Sodium: 120 mEq/L — CL (ref 137–147)

## 2014-04-07 MED ORDER — DEMECLOCYCLINE HCL 150 MG PO TABS
150.0000 mg | ORAL_TABLET | Freq: Two times a day (BID) | ORAL | Status: DC
  Administered 2014-04-07 – 2014-04-09 (×5): 150 mg via ORAL
  Filled 2014-04-07 (×7): qty 1

## 2014-04-07 MED ORDER — ONDANSETRON HCL 4 MG/2ML IJ SOLN
INTRAMUSCULAR | Status: AC
  Administered 2014-04-07: 4 mg
  Filled 2014-04-07: qty 2

## 2014-04-07 MED ORDER — MORPHINE SULFATE 2 MG/ML IJ SOLN
2.0000 mg | Freq: Four times a day (QID) | INTRAMUSCULAR | Status: DC | PRN
  Administered 2014-04-08: 2 mg via INTRAVENOUS
  Filled 2014-04-07: qty 1

## 2014-04-07 MED ORDER — ONDANSETRON HCL 4 MG/2ML IJ SOLN: 4.0000 mg | Freq: Three times a day (TID) | INTRAMUSCULAR | Status: DC | PRN

## 2014-04-07 NOTE — Progress Notes (Signed)
Subjective: She got into trouble on Saturday and had to have a 7.5 L paracentesis. Issues c Hyponatremia, Azotemia, and Acidosis continue. NaHCO3 gtt started last night.  Objective: Vital signs in last 24 hours: Temp:  [97.2 F (36.2 C)-98.3 F (36.8 C)] 98.3 F (36.8 C) (04/27 0523) Pulse Rate:  [90-94] 94 (04/27 0523) Resp:  [18-20] 20 (04/27 0523) BP: (75-125)/(30-88) 84/32 mmHg (04/27 0523) SpO2:  [97 %-100 %] 97 % (04/27 0523) Weight change:  Last BM Date: 04/05/14 Weight up 2 pounds, question accuracy given paracentesis of 7.2 L CBG (last 3)   Recent Labs  04/06/14 0736 04/06/14 0903 04/06/14 2135  GLUCAP 67* 121* 148*    Intake/Output from previous day:  Intake/Output Summary (Last 24 hours) at 04/07/14 0707 Last data filed at 04/07/14 0600  Gross per 24 hour  Intake    990 ml  Output      0 ml  Net    990 ml   04/26 0701 - 04/27 0700 In: 990 [P.O.:480; I.V.:445] Out: -    Physical Exam General appearance: Appears tired but appropriate answering questions appropriately, discussed lab results Eyes: no scleral icterus Throat: oropharynx moist without erythema Neck supple Resp: CTA Cardio: Reg GI: soft, Obese, Ascites but not tense the patient appears uncomfortable Extremities: no clubbing, cyanosis or edema Bruises noted General weakness noted.   Lab Results:  Recent Labs  04/06/14 2005 04/07/14 0457  NA 117* 120*  K 4.2 4.5  CL 87* 89*  CO2 15* 15*  GLUCOSE 161* 73  BUN 103* 106*  CREATININE 2.06* 2.32*  CALCIUM 8.5 8.6    No results found for this basename: AST, ALT, ALKPHOS, BILITOT, PROT, ALBUMIN,  in the last 72 hours   Recent Labs  04/06/14 0314 04/07/14 0457  WBC 5.8 8.2  HGB 10.5* 10.9*  HCT 30.6* 30.9*  MCV 90.5 88.3  PLT 80* 98*    Lab Results  Component Value Date   INR 1.48 04/01/2014   INR 1.44 03/24/2014   INR 1.42 03/02/2014    No results found for this basename: CKTOTAL, CKMB, CKMBINDEX, TROPONINI,  in the  last 72 hours  No results found for this basename: TSH, T4TOTAL, FREET3, T3FREE, THYROIDAB,  in the last 72 hours  No results found for this basename: VITAMINB12, FOLATE, FERRITIN, TIBC, IRON, RETICCTPCT,  in the last 72 hours  Micro Results: Recent Results (from the past 240 hour(s))  URINE CULTURE     Status: None   Collection Time    04/01/14  2:51 PM      Result Value Ref Range Status   Specimen Description URINE, CLEAN CATCH   Final   Special Requests ADDED [id number] [phone]   Final   Culture  Setup Time     Final   Value: 04/01/2014 18:46     Performed at [facility]   Colony Count     Final   Value: >=100,000 COLONIES/ML     Performed at [facility]   Culture     Final   Value: ESCHERICHIA COLI     Performed at [facility]   Report Status 04/03/2014 FINAL   Final   Organism ID, Bacteria ESCHERICHIA COLI   Final     Studies/Results: US Paracentesis  04/05/2014   CLINICAL DATA:  Cirrhosis, recurrent ascites. Request therapeutic paracentesis  EXAM: ULTRASOUND GUIDED PARACENTESIS  COMPARISON:  Previous paracentesis  PROCEDURE: An ultrasound guided paracentesis was thoroughly discussed with the patient and questions  answered. The benefits, risks, alternatives and complications were also discussed. The patient understands and wishes to proceed with the procedure. Written consent was obtained.  Ultrasound was performed to localize and mark an adequate pocket of fluid in the left lower quadrant of the abdomen. The area was then prepped and draped in the normal sterile fashion. 1% Lidocaine was used for local anesthesia. Under ultrasound guidance a 19 gauge Yueh catheter was introduced. Paracentesis was performed. The catheter was removed and a dressing applied.  COMPLICATIONS: None immediate  FINDINGS: A total of approximately 7.2 L of clear yellow fluid was removed. A fluid sample was not sent for laboratory analysis.  IMPRESSION: Successful ultrasound guided  paracentesis yielding 7.2 L of ascites.  Read by: Brayton ElKevin Bruning PA-C   Electronically Signed   By: Simonne ComeJohn  Watts M.D.   On: 04/05/2014 12:40     Medications: Scheduled: . busPIRone  5 mg Oral QHS  . ciprofloxacin  250 mg Oral BID  . doxepin  50 mg Oral QHS  . feeding supplement (PRO-STAT SUGAR FREE 64)  30 mL Oral QPC breakfast  . feeding supplement (RESOURCE BREEZE)  1 Container Oral TID WC  . heparin  5,000 Units Subcutaneous 3 times per day  . lactulose  20 g Oral Once per day on Tue Thu Sat  . metoCLOPramide  5 mg Oral BID WC  . multivitamin with minerals  1 tablet Oral Daily  . pantoprazole  40 mg Oral BID   Continuous: .  sodium bicarbonate  infusion 1000 mL 45 mL/hr at 04/06/14 2218     Assessment/Plan: Active Problems:   Hyponatremia   Protein-calorie malnutrition, severe   Liver Failure - Has been slowly progressing to end stage. LFTs and INR have been stable.  Mentally she is fine. Last Ammonia 68 and on Lactulose.  No Asterixis. Patient continues to appear stable today and answering questions appropriate  Refractory Ascites - Getting multiple recent Taps and will continue. We have discussed PleurX catheter but would do it as an end stage option. 7.2 L removed Saturday. sodium issues continue DNR  Progressing towards Hospice/Palliative Care.    N/V/Dry Heaves - Last KUB was (-).  She is on Reglan/Zofran.  She is able to get some food in.  She is on supplements. Lactate is 3.2 and gut underperfused?  Hyponatremia/Azotemia/DeH - low dose IVF and Free Water restriction was unsuccessful Much worse last night.  Na down to 117.  I started on NaHCO3 and Na 117 to 120. I added Demeclocycline and tightened free Water Restriction to 1.2 L/Day Renal Ultrasound consistent with medical renal disease Creatinine now worse - She has been off BP meds and Diuretics for several weeks. Cr up to 2.32.    Metabolic Acidosis c Lactic Acidosis from Underperfusion - low dose fluids.  NaHCO3  Escherichia coli UTI - sensitive to Cipro Cipro renally dose.  Gen Weakness - PT/OT ordered and recommendation of SNF/Rehab   Severe Prot cal Malnutrition. - See nutrition notes  DVT Proph - Hep SQ.  Watch platelets.   Will continue work on gentle hydration - using NaHCO3 - to correct electrolytes and azotemia. As stated above I added Demeclocycline and tightened free water restriction.  She is clearly not improving and some parameters are worse.  I did discuss with pt and daughter this reality.  Continue PT/OT/SW - Will need SNF. Will get Hospice Palliative care consult to help define goals of care.     LOS: 6 days  Gwen PoundsJohn M Cephas Revard 04/07/2014, 7:07 AM

## 2014-04-07 NOTE — Progress Notes (Signed)
PT Cancellation Note  Patient Details Name: Rodolph BongCheryl D Seidenberg MRN: [id number] DOB: 01-31-1946   Cancelled Treatment:    Reason Eval/Treat Not Completed: Other (comment) (pt fatigued from sleeping poorly, stated she needs to nap right now. Will follow. )   Alvester MorinJennifer K Lavada Langsam 04/07/2014, 1:26 PM [phone]

## 2014-04-07 NOTE — Progress Notes (Signed)
Pt has had increased pain, nausea and vomiting today. Noted large firm area on her left abdomen at previous paracentesis site. It appears to be increasing in size and causing some discomfort. Dr. Wylene Jacobson notified. No new orders. Malen Gauzearol Kashon Kraynak, RN

## 2014-04-07 NOTE — Progress Notes (Signed)
Palliative Medicine Team consult received. A goals of care meeting has been set for 11AM on 4/28 with her daughter Barrie Dunkeramra and Ms. Westall if if she able to participate. Thank you for the consult. We look forward to assisting this patient and family.  Anderson MaltaElizabeth Golding, DO Palliative Medicine

## 2014-04-08 DIAGNOSIS — K7010 Alcoholic hepatitis without ascites: Secondary | ICD-10-CM

## 2014-04-08 DIAGNOSIS — E871 Hypo-osmolality and hyponatremia: Secondary | ICD-10-CM

## 2014-04-08 DIAGNOSIS — R188 Other ascites: Secondary | ICD-10-CM

## 2014-04-08 DIAGNOSIS — Z515 Encounter for palliative care: Secondary | ICD-10-CM

## 2014-04-08 LAB — CBC
HEMATOCRIT: 31.2 % — AB (ref 36.0–46.0)
Hemoglobin: 10.8 g/dL — ABNORMAL LOW (ref 12.0–15.0)
MCH: 30.8 pg (ref 26.0–34.0)
MCHC: 34.6 g/dL (ref 30.0–36.0)
MCV: 88.9 fL (ref 78.0–100.0)
Platelets: 90 10*3/uL — ABNORMAL LOW (ref 150–400)
RBC: 3.51 MIL/uL — ABNORMAL LOW (ref 3.87–5.11)
RDW: 16.6 % — ABNORMAL HIGH (ref 11.5–15.5)
WBC: 11.3 10*3/uL — ABNORMAL HIGH (ref 4.0–10.5)

## 2014-04-08 LAB — BASIC METABOLIC PANEL
BUN: 108 mg/dL — ABNORMAL HIGH (ref 6–23)
CHLORIDE: 88 meq/L — AB (ref 96–112)
CO2: 19 mEq/L (ref 19–32)
Calcium: 8.4 mg/dL (ref 8.4–10.5)
Creatinine, Ser: 2.49 mg/dL — ABNORMAL HIGH (ref 0.50–1.10)
GFR calc non Af Amer: 19 mL/min — ABNORMAL LOW (ref >90)
GFR, EST AFRICAN AMERICAN: 22 mL/min — AB (ref >90)
Glucose, Bld: 97 mg/dL (ref 70–99)
Potassium: 4.8 mEq/L (ref 3.7–5.3)
Sodium: 124 mEq/L — ABNORMAL LOW (ref 137–147)

## 2014-04-08 MED ORDER — PROMETHAZINE HCL 25 MG PO TABS
25.0000 mg | ORAL_TABLET | Freq: Four times a day (QID) | ORAL | Status: DC | PRN
  Administered 2014-04-08 (×2): 25 mg via ORAL
  Administered 2014-04-09: 12.5 mg via ORAL
  Filled 2014-04-08 (×3): qty 1

## 2014-04-08 MED ORDER — ONDANSETRON HCL 4 MG/2ML IJ SOLN
4.0000 mg | Freq: Four times a day (QID) | INTRAMUSCULAR | Status: DC | PRN
  Administered 2014-04-08: 4 mg via INTRAVENOUS
  Filled 2014-04-08: qty 2

## 2014-04-08 MED ORDER — HEPARIN SODIUM (PORCINE) 5000 UNIT/ML IJ SOLN
5000.0000 [IU] | Freq: Two times a day (BID) | INTRAMUSCULAR | Status: DC
  Administered 2014-04-08 – 2014-04-09 (×2): 5000 [IU] via SUBCUTANEOUS
  Filled 2014-04-08 (×2): qty 1

## 2014-04-08 MED ORDER — MORPHINE SULFATE 15 MG PO TABS
15.0000 mg | ORAL_TABLET | ORAL | Status: DC | PRN
  Administered 2014-04-08 – 2014-04-09 (×2): 15 mg via ORAL
  Filled 2014-04-08 (×2): qty 1

## 2014-04-08 NOTE — Progress Notes (Signed)
Full note to follow:  I met today with Ms. Mcmenamin and her three daughters. Their priority is comfort and that she does not suffer. They wish to return to [facility] with hospice (family plans to supplement for 24 hours coverage and care). She does not feel she will benefit from rehabilitation and wants to be made comfortable. I have given and explained MOST form and they will continue to contemplate and we will complete prior to discharge. They had much praise for Dr. Virgina Jock and would like him to continue her care with hospice. I will continue to follow and support.   Vinie Sill, NP Palliative Medicine Team Pager # [phone] (M-F 8a-5p) Team Phone # [phone] (Nights/Weekends)

## 2014-04-08 NOTE — Progress Notes (Signed)
Subjective: She got into trouble on Saturday and had to have a 7.5 L paracentesis. Issues c Hyponatremia, Azotemia, and Acidosis continue. NaHCO3 gtt started. I increased IV Zofran and MSO4 dosing  Objective: Vital signs in last 24 hours: Temp:  [97.3 F (36.3 C)-98 F (36.7 C)] 97.5 F (36.4 C) (04/28 0441) Pulse Rate:  [90-96] 90 (04/28 0441) Resp:  [17-20] 20 (04/28 0441) BP: (87-117)/(36-39) 117/36 mmHg (04/28 0441) SpO2:  [96 %-98 %] 97 % (04/28 0441) Weight change:  Last BM Date: 04/06/14 Weight up 2 Jacobson, question accuracy given paracentesis of 7.2 L CBG (last 3)   Recent Labs  04/07/14 0750 04/07/14 1153 04/07/14 1705  GLUCAP 73 167* 162*    Intake/Output from previous day:  Intake/Output Summary (Last 24 hours) at 04/08/14 0727 Last data filed at 04/08/14 0600  Gross per 24 hour  Intake   1662 ml  Output      0 ml  Net   1662 ml   04/27 0701 - 04/28 0700 In: 1662 [P.O.:582; I.V.:1080] Out: -    Physical Exam General appearance: Appears tired but appropriate answering questions appropriately, discussed lab results Throat: oropharynx moist without erythema Neck supple Resp: CTA Cardio: Reg GI: soft, Obese, Ascites but not tense the patient appears uncomfortable Extremities: no clubbing, cyanosis or edema Bruises noted General weakness noted.   Lab Results:  Recent Labs  04/07/14 0457 04/08/14 0508  NA 120* 124*  K 4.5 4.8  CL 89* 88*  CO2 15* 19  GLUCOSE 73 97  BUN 106* 108*  CREATININE 2.32* 2.49*  CALCIUM 8.6 8.4    No results found for this basename: AST, ALT, ALKPHOS, BILITOT, PROT, ALBUMIN,  in the last 72 hours   Recent Labs  04/07/14 0457 04/08/14 0508  WBC 8.2 11.3*  HGB 10.9* 10.8*  HCT 30.9* 31.2*  MCV 88.3 88.9  PLT 98* 90*    Lab Results  Component Value Date   INR 1.48 04/01/2014   INR 1.44 03/24/2014   INR 1.42 03/02/2014    No results found for this basename: CKTOTAL, CKMB, CKMBINDEX, TROPONINI,  in the  last 72 hours  No results found for this basename: TSH, T4TOTAL, FREET3, T3FREE, THYROIDAB,  in the last 72 hours  No results found for this basename: VITAMINB12, FOLATE, FERRITIN, TIBC, IRON, RETICCTPCT,  in the last 72 hours  Micro Results: Recent Results (from the past 240 hour(s))  URINE CULTURE     Status: None   Collection Time    04/01/14  2:51 PM      Result Value Ref Range Status   Specimen Description URINE, CLEAN CATCH   Final   Special Requests ADDED [id number] [phone]   Final   Culture  Setup Time     Final   Value: 04/01/2014 18:46     Performed at [facility]   Colony Count     Final   Value: >=100,000 COLONIES/ML     Performed at [facility]   Culture     Final   Value: ESCHERICHIA COLI     [facility]formed at [facility]   Report Status 04/03/2014 FINAL   Final   Organism ID, Bacteria ESCHERICHIA COLI   Final     Studies/Results: No results found.   Medications: Scheduled: . busPIRone  5 mg Oral QHS  . ciprofloxacin  250 mg Oral BID  . demeclocycline  150 mg Oral Q12H  . doxepin  50 mg Oral QHS  .  feeding supplement (PRO-STAT SUGAR FREE 64)  30 mL Oral QPC breakfast  . feeding supplement (RESOURCE BREEZE)  1 Container Oral TID WC  . heparin  5,000 Units Subcutaneous 3 times per day  . lactulose  20 g Oral Once per day on Tue Thu Sat  . metoCLOPramide  5 mg Oral BID WC  . multivitamin with minerals  1 tablet Oral Daily  . pantoprazole  40 mg Oral BID   Continuous: .  sodium bicarbonate  infusion 1000 mL 45 mL/hr at 04/07/14 1955     Assessment/Plan: Active Problems:   Hyponatremia   Protein-calorie malnutrition, severe   Liver Failure c Refractory Ascites and now progressive Azotemia - AKI - Has been slowly progressing to end stage.  Mentally she is doing OK. Last Ammonia 68 and on Lactulose.  No Asterixis. Patient continues to appear stable today and answering questions appropriate.  Last Tap was Saturday.  Continue DNR   Hospice/Palliative Care was called in yesterday and there is a meeting scheduled for today.  I increased MSO4 and Zofran dosing yesterday for palliation. Hospice Palliative Care will help me identify goals of care. We all have to decide where we are going on D/c.  At this time SNF seems most likely c Hospice Palliative Care following.  We have to decide on timing of next tap - we will need to continue Paracentesis as when she has Tense Ascites she cannot breathe and she hurts more.  At the time of her next tap we probably will need to put a Pleur-X catheter so she can be drained as needed and with less effort.   N/V/Dry Heaves - Reglan/Zofran.  She has poor nutritional intake.  She is on supplements. Lactate is 3.2 and gut underperfused?  Hyponatremia/Azotemia - low dose IVF and Free Water restriction was unsuccessful Lowest Na down to 117.  I started on NaHCO3 and Na 117 to 120 to 124 today but Cr has worsened.  Bicarb and acidosis is some better though. I added Demeclocycline and tightened free Water Restriction to 1.2 L/Day Renal Ultrasound consistent with medical renal disease Creatinine now worse - She has been off BP meds and Diuretics for several weeks.   Escherichia coli UTI - sensitive to Cipro Cipro renally dosed.  Gen Weakness - PT/OT ordered and recommendation of SNF/Rehab   Severe Prot cal Malnutrition. - See nutrition notes  DVT Proph - Hep SQ.  Watch platelets.   Will continue NaHCO3 at low dose but if @s  do not get better we will have to just D/c it soon. Continue  Demeclocycline and tightened free water restriction.  She is clearly not improving and parameters are even worse.  I did discuss with pt and daughters this reality. Hospice Palliative care consult to help define goals of care and to follow her as outpt when she leaves.     LOS: 7 days   Elizabeth PoundsJohn Jacobson [name] 04/08/2014, 7:27 AM

## 2014-04-08 NOTE — Progress Notes (Signed)
PT Cancellation Note  Patient Details Name: Rodolph BongCheryl D Kuhar MRN: [id number] DOB: [birth year]   Cancelled Treatment:    Reason Eval/Treat Not Completed: Other (comment) (GOC meeting this am... will follow up later this am)   Fabio AsaDevon J Wakisha Alberts 04/08/2014, 8:24 AM Charlotte Crumbevon Mosella Kasa, PT DPT  [phone]

## 2014-04-08 NOTE — Progress Notes (Signed)
OT Cancellation Note  Patient Details Name: [name] MRN: [id number] DOB: 03-08-1946   Cancelled Treatment:    Reason Eval/Treat Not Completed: Other (comment) (Pt is comfort care at this time. OT signing off.)  Earlie RavelingLindsey L Dennise Raabe OTR/L [phone] 04/08/2014, 2:17 PM

## 2014-04-08 NOTE — Consult Note (Signed)
Patient FY:BOFBPZ MERCEDEES CONVERY      DOB: 20-Apr-1946      WCH:[id number]     Consult Note from the Palliative Medicine Team at [facility] Requested by: Dr. Virgina Jock     PCP: Precious Reel, MD Reason for Consultation: [place] and options.      Phone [phone]  Assessment of patients Current state:  Ms. 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 She continues to have worsening weakness, nausea/vomitting, and intermittent shortness of breath.   I met today with Ms. Quesenberry and her three daughters: Nehemiah Massed, and Danae Chen. We discussed the natural progression of her liver disease. They are all very tearful and realistic in expectations. Their priority is comfort and that she does not suffer. They wish to return to [facility] facility with hospice (family plans to supplement for 24 hour coverage and care). She would also wish to have a paracentesis drain placed when time to drain again (they are hoping before discharge). She does not feel she will benefit from rehabilitation and wants to be made comfortable. I have given and explained MOST form and they will continue to contemplate and we will complete prior to discharge. They had much praise for Dr. Virgina Jock and would like him to continue her care with hospice. Ms. Behrle tells me that she is not worried about anything except that she will be comfortable and leaving behind her family. She tells me that she is at peace. I will continue to follow and support Ms. Jimenez and her family.    Goals of Care: 1.  Code Status: DNR   2. Scope of Treatment: 1. Vital Signs: routine  2. Respiratory/Oxygen: yes 3. Nutritional Support/Tube Feeds: no 4. Consults: CSW, spiritual care   4. Disposition: Return to [facility] with hospice.    3. Symptom Management:   1. Pain: Morphine prn. Robaxin prn.   2. Bowel Regimen: Lactulose scheduled. Miralax prn.  3. Nausea/Vomiting: Reglan scheduled. Ondansetron and phenergan prn.   4. Psychosocial: Emotional support provided to patient and family.   5. Spiritual: Spiritual care consulted.    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 She continues to have worsening weakness, nausea/vomitting, and intermittent shortness of breath.    ROS: + nausea, + intermittent abdominal pain, denies diarrhea/constipation    PMH:  Past Medical History  Diagnosis Date  . Exogenous obesity   . Hypertension   . Anxiety and depression     chronic  . Hiatal hernia   . Atypical chest pain 06/10/03    normal 2 day adenosine cardiolite  . Memory changes   . Bipolar disorder   . Ascites   . Cirrhosis   . Anginal pain   . Exertional shortness of breath   . Shortness of breath     "all the time right now" (09/19/2013)  . Anemia   . History of blood transfusion 05/2013    "once; related to a stomach ulcer" (09/19/2013)  . Bleeding stomach ulcer 04/22/2013  . GERD (gastroesophageal reflux disease)   . Osteoarthritis   . Arthritis     "all over my body" (09/19/2013)  . Anxiety   . Depression   . Fatty liver disease, nonalcoholic   . CVA (cerebral infarction) 04/21/13    CCT c/w Left cerebellar infarct which appears  subacute to remote     PSH: Past Surgical History  Procedure Laterality Date  . Tubal ligation  1977  . Cardiac catheterization  1990    normal cath  . Esophagogastroduodenoscopy N/A 04/22/2013    Procedure: ESOPHAGOGASTRODUODENOSCOPY (EGD);  Surgeon: Lear Ng, MD;  Location: [facility] ENDOSCOPY;  Service: Endoscopy;  Laterality: N/A;  . Paracentesis  09/13/2013    "just once" (09/19/2013)  . Carpal tunnel release Bilateral 1990's  . Knee arthroscopy w/ acl reconstruction Bilateral ?1990's  . Vaginal hysterectomy  1980  . Salpingoophorectomy  Bilateral 1990's   I have reviewed the [place] and SH and  If appropriate update it with new information. Allergies  Allergen Reactions  . Codeine Itching  . Sulfa Antibiotics Other (See Comments)    Pull my hair out   Scheduled Meds: . busPIRone  5 mg Oral QHS  . ciprofloxacin  250 mg Oral BID  . demeclocycline  150 mg Oral Q12H  . doxepin  50 mg Oral QHS  . feeding supplement (PRO-STAT SUGAR FREE 64)  30 mL Oral QPC breakfast  . feeding supplement (RESOURCE BREEZE)  1 Container Oral TID WC  . heparin  5,000 Units Subcutaneous Q12H  . lactulose  20 g Oral Once per day on Tue Thu Sat  . metoCLOPramide  5 mg Oral BID WC  . multivitamin with minerals  1 tablet Oral Daily  . pantoprazole  40 mg Oral BID   Continuous Infusions: .  sodium bicarbonate  infusion 1000 mL 40 mL/hr at 04/08/14 0759   PRN Meds:.albuterol, docusate sodium, feeding supplement (PRO-STAT SUGAR FREE 64), methocarbamol, morphine, morphine injection, nitroGLYCERIN, ondansetron (ZOFRAN) IV, ondansetron, polyethylene glycol, promethazine    BP 117/36  Pulse 90  Temp(Src) 97.5 F (36.4 C) (Oral)  Resp 20  Ht $R'5\' 6"'Fn$  (1.676 m)  Wt 117.574 kg (259 lb 3.3 oz)  BMI 41.86 kg/m2  SpO2 97%   PPS: 30%   Intake/Output Summary (Last 24 hours) at 04/08/14 1236 Last data filed at 04/08/14 0600  Gross per 24 hour  Intake   1562 ml  Output      0 ml  Net   1562 ml   LBM: 04/06/14                         Physical Exam:  General: NAD, ill appearing, appears fatigued HEENT: [place]/AT, moist mucous membranes without exudate, no JVD Chest: CTA throughout, labored with activity CVS: RRR, S1 S2 Abdomen: Soft, NT, ND, +BS Ext: No edema, warm to touch Neuro: Alert, oriented x 3  Labs: CBC    Component Value Date/Time   WBC 11.3* 04/08/2014 0508   RBC 3.51* 04/08/2014 0508   HGB 10.8* 04/08/2014 0508   HCT 31.2* 04/08/2014 0508   PLT 90* 04/08/2014 0508   MCV 88.9 04/08/2014 0508   MCH 30.8 04/08/2014 0508   MCHC 34.6  04/08/2014 0508   RDW 16.6* 04/08/2014 0508   LYMPHSABS 1.0 04/01/2014 1336   MONOABS 1.2* 04/01/2014 1336   EOSABS 0.3 04/01/2014 1336   BASOSABS 0.0 04/01/2014 1336    BMET    Component Value Date/Time   NA 124* 04/08/2014 0508   K 4.8 04/08/2014 0508   CL 88* 04/08/2014 0508   CO2 19 04/08/2014 0508   GLUCOSE 97 04/08/2014 0508   BUN 108* 04/08/2014 0508   CREATININE 2.49* 04/08/2014 0508   CALCIUM 8.4 04/08/2014 0508   GFRNONAA 19* 04/08/2014 5364  GFRAA 22* 04/08/2014 0508    CMP     Component Value Date/Time   NA 124* 04/08/2014 0508   K 4.8 04/08/2014 0508   CL 88* 04/08/2014 0508   CO2 19 04/08/2014 0508   GLUCOSE 97 04/08/2014 0508   BUN 108* 04/08/2014 0508   CREATININE 2.49* 04/08/2014 0508   CALCIUM 8.4 04/08/2014 0508   PROT 6.5 04/01/2014 1336   ALBUMIN 2.6* 04/01/2014 1336   AST 112* 04/01/2014 1336   ALT 64* 04/01/2014 1336   ALKPHOS 117 04/01/2014 1336   BILITOT 2.7* 04/01/2014 1336   GFRNONAA 19* 04/08/2014 0508   GFRAA 22* 04/08/2014 0508     Time In Time Out Total Time Spent with Patient Total Overall Time  1105 1255 50min 173min    Greater than 50%  of this time was spent counseling and coordinating care related to the above assessment and plan.  Vinie Sill, NP Palliative Medicine Team Pager # [phone] (M-F 8a-5p) Team Phone # [phone] (Nights/Weekends)

## 2014-04-09 DIAGNOSIS — E871 Hypo-osmolality and hyponatremia: Principal | ICD-10-CM

## 2014-04-09 LAB — BASIC METABOLIC PANEL
BUN: 113 mg/dL — AB (ref 6–23)
CO2: 22 mEq/L (ref 19–32)
CREATININE: 2.63 mg/dL — AB (ref 0.50–1.10)
Calcium: 8.4 mg/dL (ref 8.4–10.5)
Chloride: 88 mEq/L — ABNORMAL LOW (ref 96–112)
GFR calc Af Amer: 21 mL/min — ABNORMAL LOW (ref >90)
GFR, EST NON AFRICAN AMERICAN: 18 mL/min — AB (ref >90)
Glucose, Bld: 123 mg/dL — ABNORMAL HIGH (ref 70–99)
Potassium: 5 mEq/L (ref 3.7–5.3)
SODIUM: 126 meq/L — AB (ref 137–147)

## 2014-04-09 LAB — CBC
HCT: 30.9 % — ABNORMAL LOW (ref 36.0–46.0)
Hemoglobin: 10.5 g/dL — ABNORMAL LOW (ref 12.0–15.0)
MCH: 30.8 pg (ref 26.0–34.0)
MCHC: 34 g/dL (ref 30.0–36.0)
MCV: 90.6 fL (ref 78.0–100.0)
PLATELETS: 100 10*3/uL — AB (ref 150–400)
RBC: 3.41 MIL/uL — ABNORMAL LOW (ref 3.87–5.11)
RDW: 16.6 % — AB (ref 11.5–15.5)
WBC: 10.6 10*3/uL — ABNORMAL HIGH (ref 4.0–10.5)

## 2014-04-09 MED ORDER — PROMETHAZINE HCL 25 MG PO TABS: 25.0000 mg | ORAL_TABLET | Freq: Four times a day (QID) | ORAL | Status: AC | PRN

## 2014-04-09 MED ORDER — MORPHINE SULFATE 15 MG PO TABS: 15.0000 mg | ORAL_TABLET | ORAL | Status: AC | PRN

## 2014-04-09 NOTE — Discharge Summary (Signed)
Physician Discharge Summary  DISCHARGE SUMMARY   Patient ID: [name] MR#: [id number] DOB/AGE: Aug 08, 1946 68 y.o.   Attending Physician:Budd Freiermuth Viviana Simpler  Patient's CHY:IFOYD,XAJO M, MD  Consults:Treatment Team:  Palliative [facility]**  Admit date: 04/01/2014 Discharge date: 04/09/2014  Discharge Diagnoses:  Active Problems:   Hyponatremia   Protein-calorie malnutrition, severe   Patient Active Problem List   Diagnosis Date Noted  . Protein-calorie malnutrition, severe 04/02/2014  . Unspecified protein-calorie malnutrition 03/25/2014  . Hyponatremia 03/25/2014  . Hyperkalemia 03/25/2014  . Azotemia 03/24/2014  . Cirrhosis 09/19/2013  . Ascites 09/19/2013  . Shortness of breath 09/19/2013  . GI bleed 04/22/2013  . Anemia 04/21/2013  . Falls frequently 04/21/2013  . CVA (cerebral infarction) 04/21/2013  . Exogenous obesity   . Anxiety and depression   . Hiatal hernia   . Osteoarthritis   . Atypical chest pain   . Memory changes   . Bipolar disorder   . Benign hypertensive heart disease without heart failure 11/09/2011  . Chest pain 11/09/2011  . Anxiety 11/09/2011   Past Medical History  Diagnosis Date  . Exogenous obesity   . Hypertension   . Anxiety and depression     chronic  . Hiatal hernia   . Atypical chest pain 06/10/03    normal 2 day adenosine cardiolite  . Memory changes   . Bipolar disorder   . Ascites   . Cirrhosis   . Anginal pain   . Exertional shortness of breath   . Shortness of breath     "all the time right now" (09/19/2013)  . Anemia   . History of blood transfusion 05/2013    "once; related to a stomach ulcer" (09/19/2013)  . Bleeding stomach ulcer 04/22/2013  . GERD (gastroesophageal reflux disease)   . Osteoarthritis   . Arthritis     "all over my body" (09/19/2013)  . Anxiety   . Depression   . Fatty liver disease, nonalcoholic   . CVA (cerebral infarction) 04/21/13    CCT c/w Left cerebellar infarct which appears  subacute to remote    Discharged Condition: poor   Discharge Medications:   Medication List    STOP taking these medications       cyclobenzaprine 10 MG tablet  Commonly known as:  FLEXERIL     ENULOSE 10 GM/15ML Soln  Generic drug:  lactulose (encephalopathy)     furosemide 40 MG tablet  Commonly known as:  LASIX     Melatonin 3 MG Tabs     nitroGLYCERIN 0.4 MG SL tablet  Commonly known as:  NITROSTAT      TAKE these medications       acetaminophen 325 MG tablet  Commonly known as:  TYLENOL  Take 1 tablet (325 mg) by mouth 2 every 6 hours as needed for pain     albuterol (2.5 MG/3ML) 0.083% nebulizer solution  Commonly known as:  PROVENTIL  Take 2.5 mg by nebulization 4 (four) times daily as needed for wheezing.     busPIRone 5 MG tablet  Commonly known as:  BUSPAR  Take 5 mg by mouth at bedtime.     CERTAVITE/ANTIOXIDANTS Tabs  Take 1 tablet by mouth daily. Take on an empty stomach (10am)     docusate sodium 100 MG capsule  Commonly known as:  COLACE  Take 100 mg by mouth daily as needed (constipation).     doxepin 50 MG capsule  Commonly known as:  SINEQUAN  Take 50 mg by  mouth at bedtime.     feeding supplement (PRO-STAT SUGAR FREE 64) Liqd  Take 30 mLs by mouth See admin instructions. Take 30 mls by mouth every day after breakfast and offer a dose after dinner     feeding supplement (RESOURCE BREEZE) Liqd  Take 1 Container by mouth 3 (three) times daily with meals.     methocarbamol 500 MG tablet  Commonly known as:  ROBAXIN  Take 1 tablet (500 mg total) by mouth every 12 (twelve) hours as needed for muscle spasms (and 1 hr prior to Paracentesis).     metoCLOPramide 5 MG tablet  Commonly known as:  REGLAN  Take 5 mg by mouth 2 (two) times daily with breakfast and lunch.     morphine 15 MG tablet  Commonly known as:  MSIR  Take 1 tablet (15 mg total) by mouth every 4 (four) hours as needed for moderate pain or severe pain.     ondansetron 4 MG  tablet  Commonly known as:  ZOFRAN  Take 4 mg by mouth every 6 (six) hours as needed for nausea or vomiting.     pantoprazole 40 MG tablet  Commonly known as:  PROTONIX  Take 40 mg by mouth 2 (two) times daily.     polyethylene glycol packet  Commonly known as:  MIRALAX / GLYCOLAX  Take 17 g by mouth daily as needed (constipation). Mix with 8 oz of fluid and drink     promethazine 25 MG tablet  Commonly known as:  PHENERGAN  Take 1 tablet (25 mg total) by mouth every 6 (six) hours as needed for nausea.        Hospital Procedures: Dg Abd 1 View  03/28/2014   CLINICAL DATA:  Vomiting and diarrhea.  EXAM: ABDOMEN - 1 VIEW  COMPARISON:  None.  FINDINGS: The patient is rotated to the left on today's images.  There is gas within nondilated small bowel and gas and formed stool in the colon. Vascular calcifications noted in the anatomic pelvis. There is evidence of lumbar spondylosis.  IMPRESSION: 1. Unremarkable bowel gas pattern. 2. Lumbar spondylosis.   Electronically Signed   By: Sherryl Barters M.D.   On: 03/28/2014 18:30   US Renal  04/03/2014   CLINICAL DATA:  Worsening creatinine  EXAM: RENAL/URINARY TRACT ULTRASOUND COMPLETE  COMPARISON:  CT abdomen pelvis dated 10/31/2013  FINDINGS: Right Kidney:  Length: 11.9 cm. Echogenic renal parenchyma with cortical thinning, suggesting medical renal disease. No mass or hydronephrosis.  Left Kidney:  Length: 11.0 cm. Echogenic renal parenchyma with cortical thinning, suggesting medical renal disease. No mass or hydronephrosis.  Bladder:  Within normal limits.  Additional comments:  Ascites.  IMPRESSION: Echogenic renal parenchyma with cortical thinning, suggesting medical renal disease.  No hydronephrosis.  Ascites.   Electronically Signed   By: Julian Hy M.D.   On: 04/03/2014 10:41   US Paracentesis  04/05/2014   CLINICAL DATA:  Cirrhosis, recurrent ascites. Request therapeutic paracentesis  EXAM: ULTRASOUND GUIDED PARACENTESIS   COMPARISON:  Previous paracentesis  PROCEDURE: An ultrasound guided paracentesis was thoroughly discussed with the patient and questions answered. The benefits, risks, alternatives and complications were also discussed. The patient understands and wishes to proceed with the procedure. Written consent was obtained.  Ultrasound was performed to localize and mark an adequate pocket of fluid in the left lower quadrant of the abdomen. The area was then prepped and draped in the normal sterile fashion. 1% Lidocaine was used for local anesthesia. Under  ultrasound guidance a 19 gauge Yueh catheter was introduced. Paracentesis was performed. The catheter was removed and a dressing applied.  COMPLICATIONS: None immediate  FINDINGS: A total of approximately 7.2 L of clear yellow fluid was removed. A fluid sample was not sent for laboratory analysis.  IMPRESSION: Successful ultrasound guided paracentesis yielding 7.2 L of ascites.  Read by: Ascencion Dike PA-C   Electronically Signed   By: Sandi Mariscal M.D.   On: 04/05/2014 12:40   US Paracentesis  03/27/2014   CLINICAL DATA:  Cirrhosis, recurrent ascites, renal failure. Request is made for therapeutic paracentesis up to 5 liters.  EXAM: ULTRASOUND GUIDED THERAPEUTIC PARACENTESIS  COMPARISON:  PRIOR PARACENTESIS ON 03/25/2014  PROCEDURE: An ultrasound guided paracentesis was thoroughly discussed with the patient and questions answered. The benefits, risks, alternatives and complications were also discussed. The patient understands and wishes to proceed with the procedure. Written consent was obtained.  Ultrasound was performed to localize and mark an adequate pocket of fluid in the left lower quadrant of the abdomen. The area was then prepped and draped in the normal sterile fashion. 1% Lidocaine was used for local anesthesia. Under ultrasound guidance a 19 gauge Yueh catheter was introduced. Paracentesis was performed. The catheter was removed and a dressing applied.   Complications: None.  FINDINGS: A total of approximately 5 liters of amber fluid was removed.  IMPRESSION: Successful ultrasound guided therapeutic paracentesis yielding 5 liters of ascites.  Read by: Rowe Robert ,P.A.-C.   Electronically Signed   By: Arne Cleveland M.D.   On: 03/27/2014 11:18   US Paracentesis  03/25/2014   CLINICAL DATA:  Recurrent ascites. Request therapeutic paracentesis.  EXAM: ULTRASOUND GUIDED PARACENTESIS  COMPARISON:  Previous paracentesis  PROCEDURE: An ultrasound guided paracentesis was thoroughly discussed with the patient and questions answered. The benefits, risks, alternatives and complications were also discussed. The patient understands and wishes to proceed with the procedure. Written consent was obtained.  Ultrasound was performed to localize and mark an adequate pocket of fluid in the left lower quadrant of the abdomen. The area was then prepped and draped in the normal sterile fashion. 1% Lidocaine was used for local anesthesia. Under ultrasound guidance a 19 gauge Yueh catheter was introduced. Paracentesis was performed. The catheter was removed and a dressing applied.  COMPLICATIONS: None immediate  FINDINGS: A total of approximately 7.7 L of clear yellow fluid was removed. A fluid sample was not sent for laboratory analysis.  IMPRESSION: Successful ultrasound guided paracentesis yielding 7.7 L of ascites.  Read by: Ascencion Dike PA-C   Electronically Signed   By: Markus Daft M.D.   On: 03/25/2014 12:21   US Paracentesis  03/14/2014   CLINICAL DATA:  Cirrhosis, recurrent ascites, request for paracentesis.  EXAM: ULTRASOUND GUIDED therapeutic PARACENTESIS  COMPARISON:  None.  PROCEDURE: An ultrasound guided paracentesis was thoroughly discussed with the patient and questions answered. The benefits, risks, alternatives and complications were also discussed. The patient understands and wishes to proceed with the procedure. Written consent was obtained.  Ultrasound was  performed to localize and mark an adequate pocket of fluid in the left middle to lower quadrant of the abdomen. The area was then prepped and draped in the normal sterile fashion. 1% Lidocaine was used for local anesthesia. Under ultrasound guidance a 19 gauge Yueh catheter was introduced. Paracentesis was performed. The catheter was removed and a dressing applied.  Complications: None.  FINDINGS: A total of approximately 8.5 liters of yellow fluid was removed. A  fluid sample was not sent for laboratory analysis.  IMPRESSION: Successful ultrasound guided paracentesis yielding 8.5 liters of ascites.  Read By:  Tsosie Billing PA-C   Electronically Signed   By: Daryll Brod M.D.   On: 03/14/2014 15:09   Dg Chest Port 1 View  03/25/2014   CLINICAL DATA:  Shortness of breath, history hypertension  EXAM: PORTABLE CHEST - 1 VIEW  COMPARISON:  Portable exam 0721 hr compared to 03/02/2014  FINDINGS: Upper normal size of cardiac silhouette.  Atherosclerotic calcification aorta.  Moderate-sized hiatal hernia.  Pulmonary vascularity normal.  No definite infiltrate, pleural effusion or pneumothorax.  Minimal atelectasis left base.  IMPRESSION: Minimal left base atelectasis.  Moderate-sized hiatal hernia.   Electronically Signed   By: Lavonia Dana M.D.   On: 03/25/2014 08:17    History of Present Illness: 54 F c known progressive liver failure s/p admission 1 week prior to this current admission for refractory ascites, ARF, and electrolyte issues. We tapped off 15+ liters and she was ready for D/c on Friday April 17. She had N/v and was ill so it was postponed till Sat April 18. Back at [facility] she has became weaker, could not lift silverware, could not walk, and had continued N/V. She was supposed to see me st my office as outpt but daughter Jonelle Sidle called and said she was too weak to make it. We asked her to call EMS and transport Ms Laitinen to the ED for eval and Rx.  She denied pain anywhere. No other associated  symptoms. Denied shortness of breath.  ED W/up showed Na back down to 124. K up again. Cr 1.8. LFTs are up but stable. WBC is up and hopefully not getting a peritonitis.  It is felt she was deH and NS at 35cc/hr started.     Hospital Course: Ms Tegeler was readmitted 4/21 c continued profound weakness, N/V, Electrolyte abnormalities, deH.  She was started on Low dose NS and she was found to have an E coli UTI - treated with Cipro. Her Hyponatremia/Azotemia/DeH worsened throughout the hospital stay despite Rx.  The fluids I gave her made her ascites worse.  She had to have a paracentesis on Sat 4/25 c 7.5 L removed.  This was followed c Albumin infusion. After this her sodium, Acid/base status, and Cr continued to worsen.  I moved her fluids over to a bicarb drip.  This fixed the acid issue, brought her sodium to 126 but her Kidney failure worsened daily.  Her Cr today is 2.63 and the worst it has been.  On Monday 4/27 I had a talk c Ms Cuervo and Jonelle Sidle her daughter and told them there is nothing further the medical community has to offer except a Hospice Palliative care approach.  They met c Hospice yesterday.  They are excited to be able to be D/ced today.  DNR signed.  Hospice in place.  At this moment it looks like she will be D/ced back to [facility] c Hospice.  Today her Ascites looks worse but she is not having any issues breathing, her nausea is under control, she is eating with the aid of daughters and she says she does not feel tight.  We will hold and not do a paracentesis today. They will keep me informed @ need for the procedure - for palliation - and the next time we do the procedure we will place a Pleur-X catheter in for continued outpt draining as needed.  Today I taylored her med  list to be only what is necessary.  Morphine PO added and script signed.  Liver Failure c Refractory Ascites and now progressive Azotemia - AKI - Now end stage. Mentally she is doing better than  expected. Last Ammonia 68 and was on Lactulose. No Asterixis. I stopped the lactulose and will keep colace and Miralax prn - more for constipation Rx.  I stopped the Demeclocycline. DNR  Hospice/Palliative Care met c pt and will transition c her to the outpt setting.  N/V/Dry Heaves - Reglan/Zofran/Protonix/Phenergan. She has poor nutritional intake. She is on supplements. She feels OK today.  Hyponatremia/Azotemia - low dose IVF, Sodium bicarb gtt, and Free Water restriction was unsuccessful  Lowest Na down to 117 now 126.  Cr up to 2.63. We had her on a Free Water Restriction to 1.2 L/Day.  This is probably not necessary as she has been deemed terminal Renal Ultrasound consistent with medical renal disease.   She has been off BP meds and Diuretics for several weeks.   Escherichia coli UTI - s/p renally dosed cipro .  Gen Weakness - PT/OT done in hospital but due to terminal state NTD Severe Prot cal Malnutrition. Supplement and provide nutrition until she no longer desires.   DVT Proph - Hep SQ was provided.   She was seen on am rounds 4/29 and she was upbeat and in good spirits.  Eating better than prior.  I updated her and family that Cr was worse.  Ab looked a little more distended. However we have reached the end of her hospital stay as i cannot fix her and what we have left to offer is comfort measures.  This will be pursued at [facility].     Day of Discharge Exam BP 92/44  Pulse 88  Temp(Src) 97.8 F (36.6 C) (Oral)  Resp 18  Ht _0  (1.676 m)  Wt 117.574 kg (259 lb 3.3 oz)  BMI 41.86 kg/m2  SpO2 99%  Physical Exam: General appearance: Appears a bit more energetic this am.  Eating better. Answering questions appropriately, discussed lab results  Throat: oropharynx moist without erythema  Neck supple  Resp: CTA  Cardio: Reg  GI: soft, Obese, Ascites but not tense the patient appears uncomfortable.  A little more distended today  Extremities: no clubbing, cyanosis  or edema  General weakness noted.  Discharge Labs:  Recent Labs  04/08/14 0508 04/09/14 0329  NA 124* 126*  K 4.8 5.0  CL 88* 88*  CO2 19 22  GLUCOSE 97 123*  BUN 108* 113*  CREATININE 2.49* 2.63*  CALCIUM 8.4 8.4   No results found for this basename: AST, ALT, ALKPHOS, BILITOT, PROT, ALBUMIN,  in the last 72 hours  Recent Labs  04/08/14 0508 04/09/14 0329  WBC 11.3* 10.6*  HGB 10.8* 10.5*  HCT 31.2* 30.9*  MCV 88.9 90.6  PLT 90* 100*   No results found for this basename: CKTOTAL, CKMB, CKMBINDEX, TROPONINI,  in the last 72 hours No results found for this basename: TSH, T4TOTAL, FREET3, T3FREE, THYROIDAB,  in the last 72 hours No results found for this basename: VITAMINB12, FOLATE, FERRITIN, TIBC, IRON, RETICCTPCT,  in the last 72 hours Lab Results  Component Value Date   INR 1.48 04/01/2014   INR 1.44 03/24/2014   INR 1.42 03/02/2014       Discharge instructions:  04-Intermediate Care Facility    Disposition: [facility]ospice  Follow-up Appts: Follow-up by phone in @ 2-3 days  Condition on Discharge: Terminal  Tests Needing Follow-up: None  Time spent in discharge (includes decision making & examination of pt): 40 min  Signed: Precious Reel 04/09/2014, 9:20 AM

## 2014-04-09 NOTE — Progress Notes (Signed)
Chaplain responded to end of life consult. Pt present with her two daughters. One daughter is in [place] but is coming back soon. Pt stated that she is at peace and that "today is a good day." Chaplain helped pt and family process their feelings and provided compassionate presence. Will follow up as necessary.

## 2014-04-09 NOTE — Progress Notes (Signed)
PT Cancellation Note  Patient Details Name: [name] MRN: [id number] DOB: June 24, 1946   Cancelled Treatment:    Reason Eval/Treat Not Completed: Medical issues which prohibited therapy .  Pt nauseated, just given meds. 04/09/2014  [place] BingKen Samiah Ricklefs, PT [phone]  (pager)   Eliseo GumKenneth V Christee Mervine 04/09/2014, 12:13 PM

## 2014-04-09 NOTE — Care Management Note (Signed)
  Page 1 of 1   04/09/2014     10:51:44 AM CARE MANAGEMENT NOTE 04/09/2014  Patient:  [name],[name]   Account Number:  [id number]  Date Initiated:  04/08/2014  Documentation initiated by:  Ronny FlurryWILE,Antwyne Pingree  Subjective/Objective Assessment:     Action/Plan:   Anticipated DC Date:     Anticipated DC Plan:  ASSISTED LIVING / REST HOME  In-house referral  Clinical Social Worker  Hospice / Palliative Care      DC Planning Services  CM consult      Choice offered to / List presented to:  C-1 Patient           HH agency  [facility]   Status of service:   Medicare Important Message given?   (If response is "NO", the following Medicare IM given date fields will be blank) Date Medicare IM given:   Date Additional Medicare IM given:    Discharge Disposition:    Per UR Regulation:    If discussed at Long Length of Stay Meetings, dates discussed:   04/08/2014    Comments:  04-08-14 Patient confirmed face sheet information . Representative from [facility] schedule to assess patinet at 1300 today , for discharge back to [facility] . Spoke with patient she would like [facility] , referral made to same. Ronny FlurryHeather Trinidad Petron RN SBn [phone]

## 2014-04-09 NOTE — Progress Notes (Signed)
Patient WU:[id number]:[name]      DOB: [birth year]      YNW:[id number]  Checked in with patient this am, after yesterday goals of care.  Patient slept well and is in good spirits.  Daughter affirms that they would like to return to ALF with Hospice. They are still working on MOST form and may need Dr. Ferd Hibbsusso's help to complete once they are ready to fill it out.  Plan to transition to ALF with hospice when medically stable.  Bhavesh Vazquez L. Ladona Ridgelaylor, MD MBA The Palliative Medicine Team at [facility] Team Phone: [phone] Pager: [phone]

## 2014-04-09 NOTE — Progress Notes (Signed)
Report called to Laquita at [facility].

## 2014-04-09 NOTE — Discharge Planning (Addendum)
Patient discharged to SNF in stable condition.  

## 2014-04-09 NOTE — Progress Notes (Addendum)
Not[facility] by [facility], patient and family request services of [facility] after discharge.  Pt information reviewed with Dr Smith MinceMazzocchi, [facility] staff physician, eligibility confirmed hospice dx: Cirrhosis Spoke with pt and daughter Elizabeth Dunkeramra at bedside to initiate education related to hospice services, philosophy and team approach to care; pt and dtr voiced good understanding of information provided.  Per discussion/notes plan is to d/c today by non-emergent transport; please send completed GOLD DNR form back to ALF with pt.  [facility] assessment RN will plan to see pt after return to [facility]. [facility] representative Melissa at room on this writer's second visit and had spoken with pt and daughter to complete assessment needs; Melissa confirmed to Clinical research associatewriter, ALF will be receiving pt at discharge, Melissa informed, and dtr [name] agreed that, at this time, pt has needed DME at ALF ([facility] bed[facility] using [facility] lift to assist pt OOB when needed)- Daughter [name] requesting list of private CG agencies and writer asked CMRN Herbert SetaHeather to print list and give to daughter at bedside.     [facility]  Please notify [facility] when patient is ready to leave unit at d/c call [phone] (or [phone] if after 5 pm);  [facility] information and contact numbers also given to daughter [name] during visit.   Above information shared with [facility] Please call with any questions or concerns   Elizabeth DavidMargie Acasia Skilton, RN 04/09/2014, 1:35 PM [facility] RN Liaison [phone]

## 2014-04-29 NOTE — Consult Note (Signed)
I have reviewed and discussed the care of this patient in detail with the nurse practitioner including pertinent patient records, physical exam findings and data. I agree with details of this encounter.  

## 2014-05-12 DEATH — deceased

## 2015-06-25 IMAGING — US US PARACENTESIS
1 series · 5 of 5 positions shown · non-contrast
Comparison: Previous paracentesis

CLINICAL DATA: Recurrent ascites. Request therapeutic paracentesis.

EXAM:
ULTRASOUND GUIDED PARACENTESIS

[Series 1: us paracentesis · 0.27mm/px · 5 of 5 slices shown]
[im 1/5]
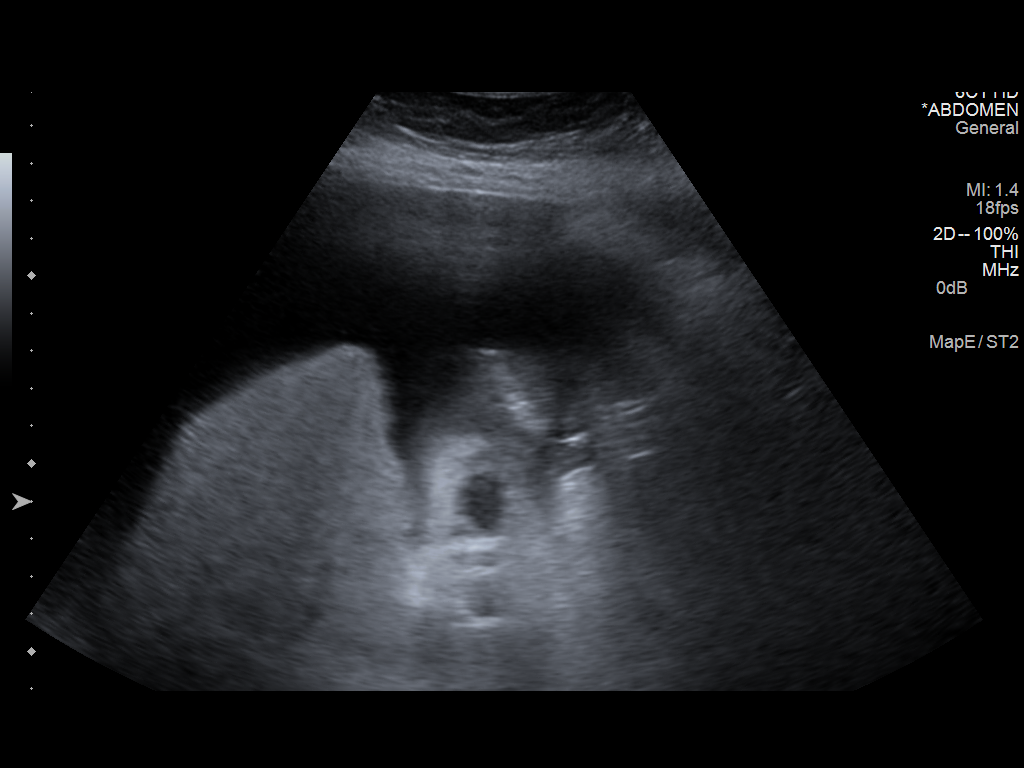
[im 2/5]
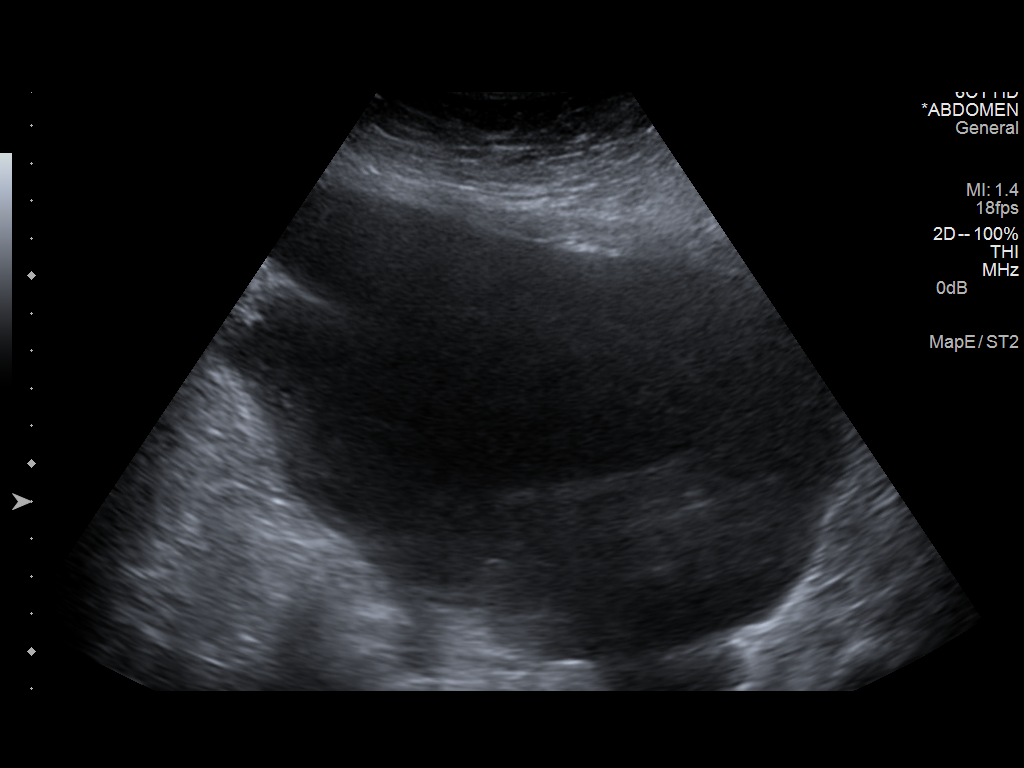
[im 3/5]
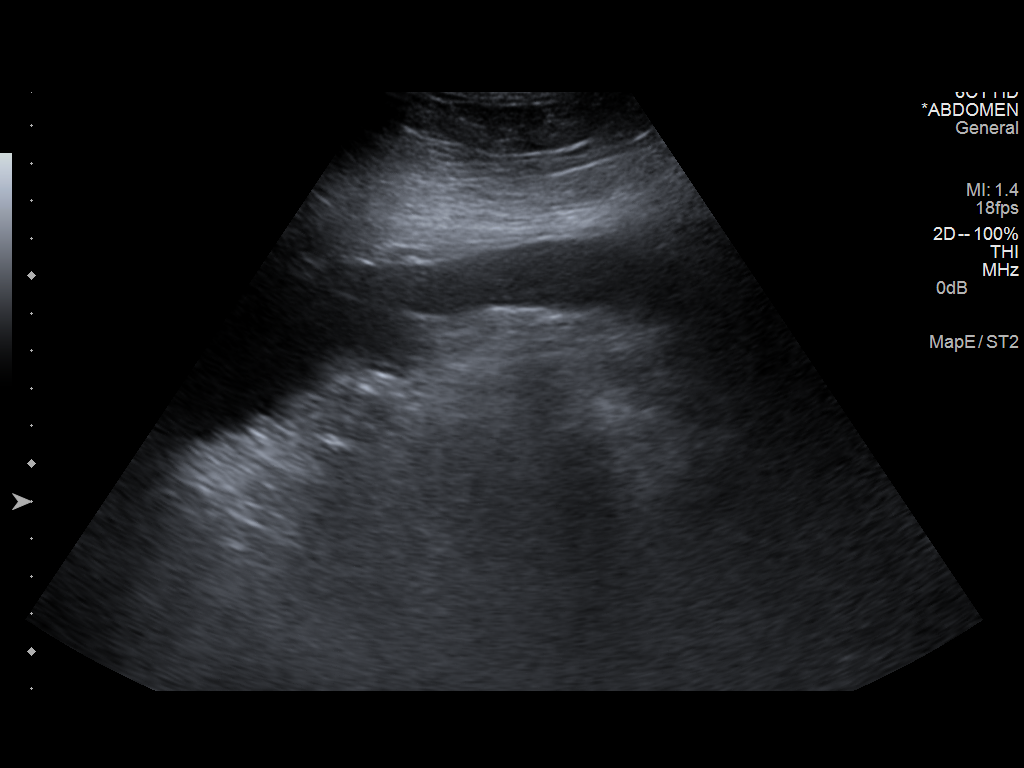
[im 4/5]
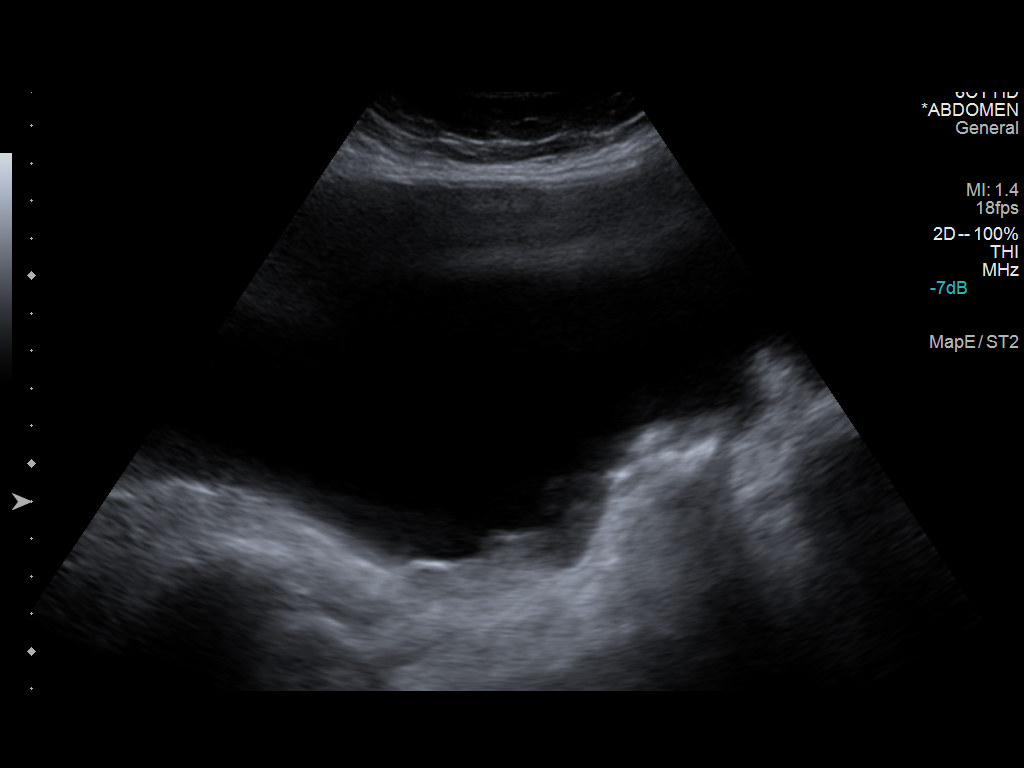
[im 5/5]
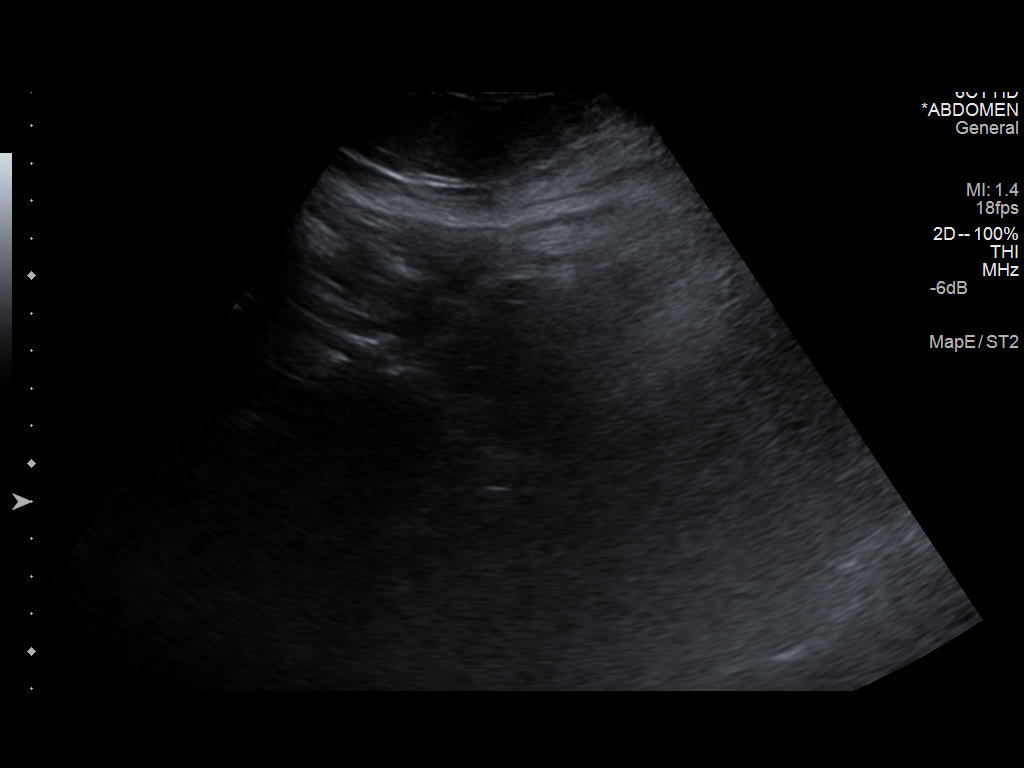

[5 of 5 positions shown; findings below may reference images not displayed]

PROCEDURE:
An ultrasound guided paracentesis was thoroughly discussed with the
patient and questions answered. The benefits, risks, alternatives
and complications were also discussed. The patient understands and
wishes to proceed with the procedure. Written consent was obtained.

Ultrasound was performed to localize and mark an adequate pocket of
fluid in the left lower quadrant of the abdomen. The area was then
prepped and draped in the normal sterile fashion. 1% Lidocaine was
used for local anesthesia. Under ultrasound guidance a 19 gauge Yueh
catheter was introduced. Paracentesis was performed. The catheter
was removed and a dressing applied.

COMPLICATIONS:
None immediate
FINDINGS: A total of approximately 7.7 L of clear yellow fluid was removed. A
fluid sample was not sent for laboratory analysis.
IMPRESSION: Successful ultrasound guided paracentesis yielding 7.7 L of ascites.

## 2015-06-27 IMAGING — US US PARACENTESIS
2 series · 10 of 10 positions shown · non-contrast
Comparison: PRIOR PARACENTESIS ON 03/25/2014

CLINICAL DATA: Cirrhosis, recurrent ascites, renal failure. Request
is made for therapeutic paracentesis up to 5 liters.

EXAM:
ULTRASOUND GUIDED THERAPEUTIC PARACENTESIS

[Series 1: us paracentesis · 0.28mm/px · 2 of 2 slices shown (1 of 2)]
[im 1/2]
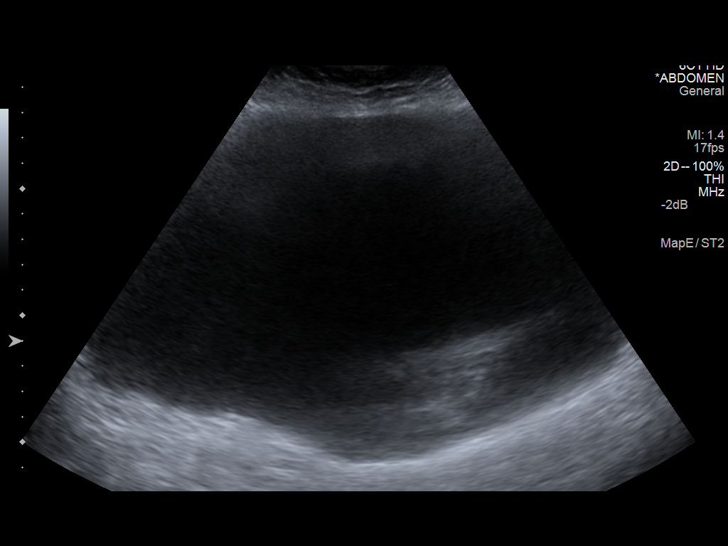
[im 2/2]
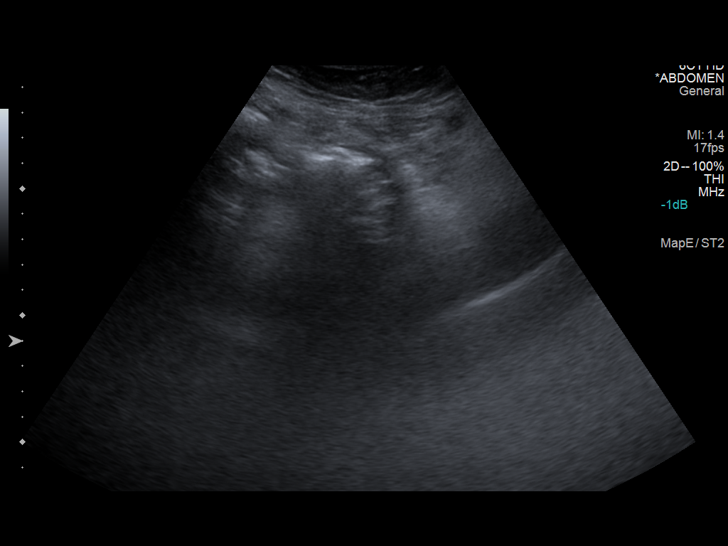

[Series 1: us paracentesis · 0.27mm/px · 8 of 8 slices shown (2 of 2)]
[im 1/8]
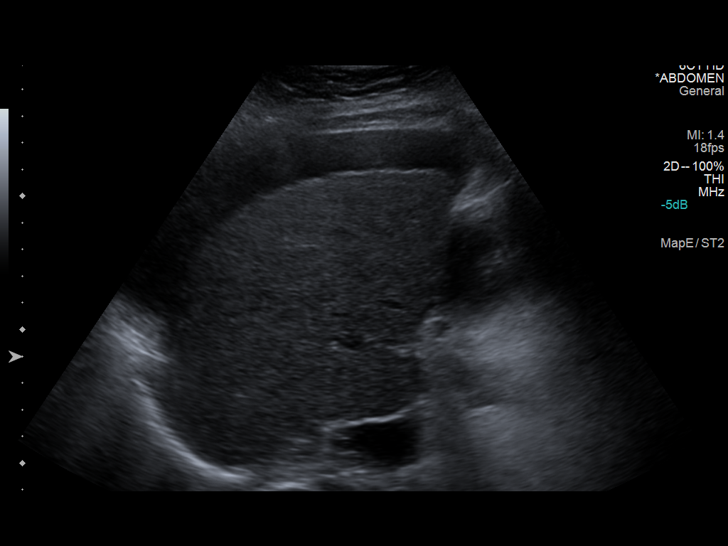
[im 2/8]
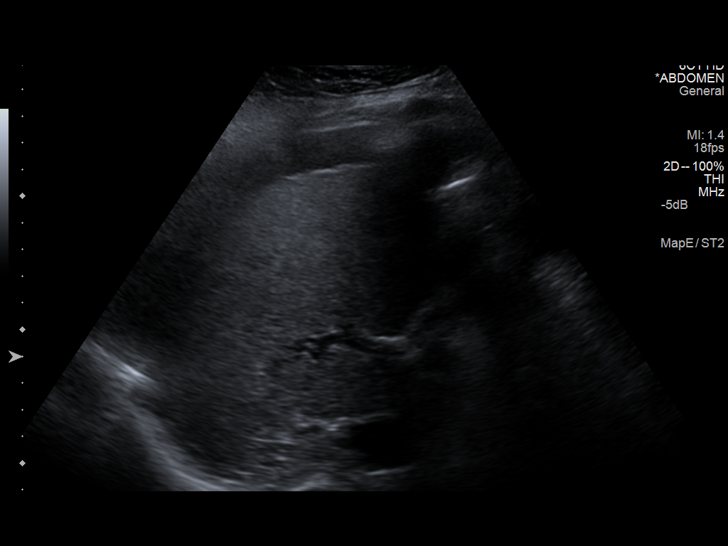
[im 3/8]
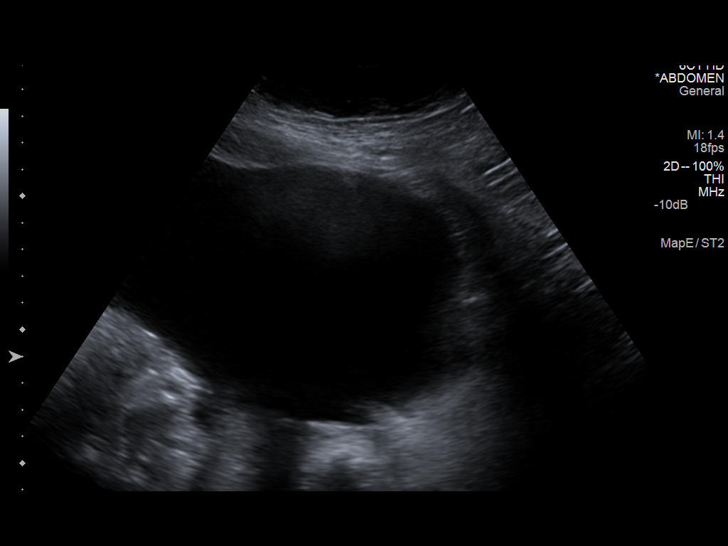
[im 4/8]
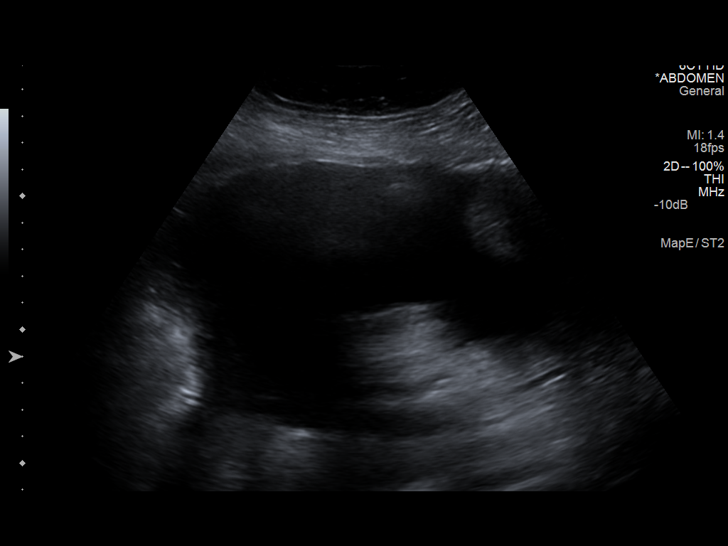
[im 5/8]
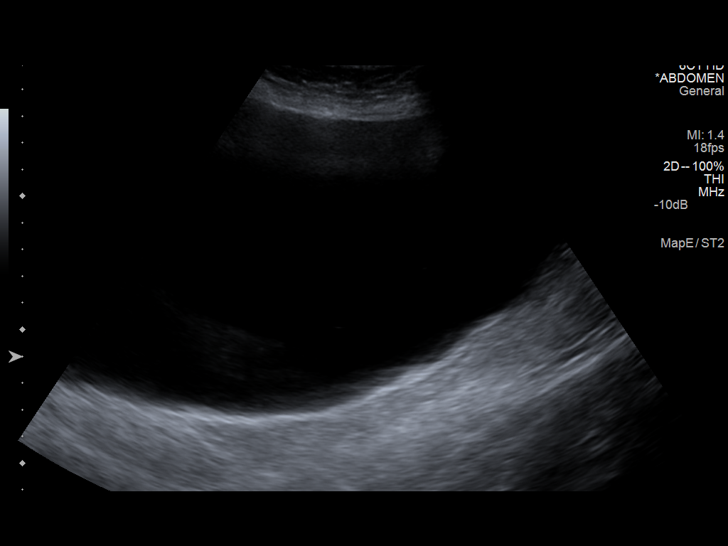
[im 6/8]
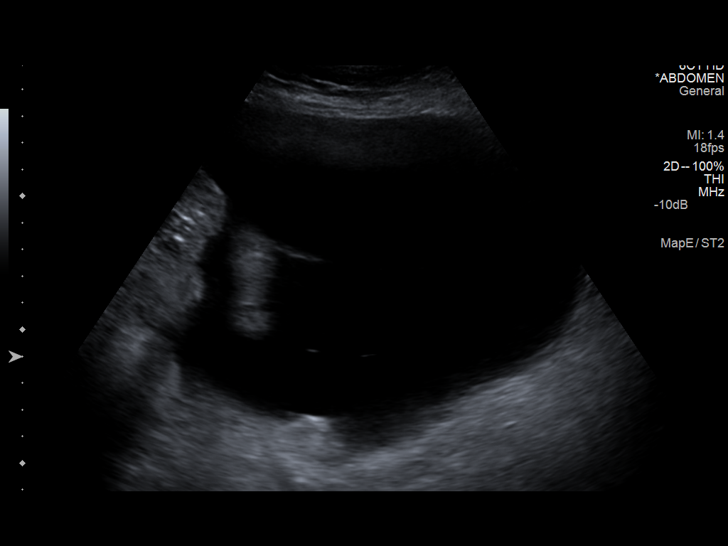
[im 7/8]
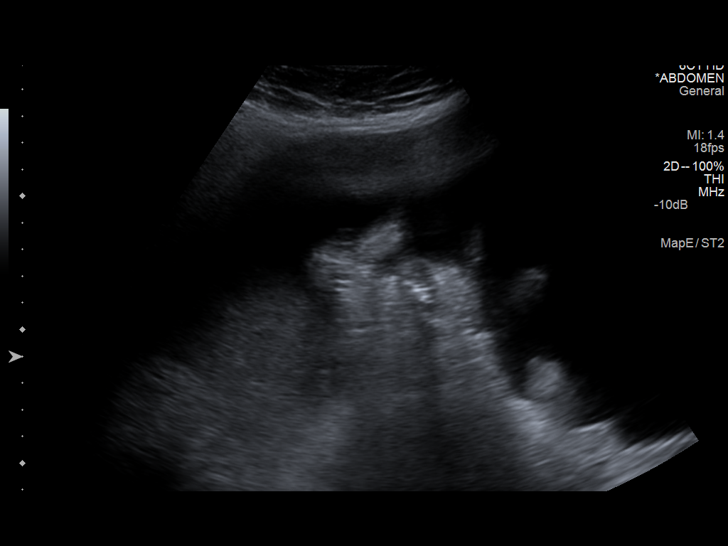
[im 8/8]
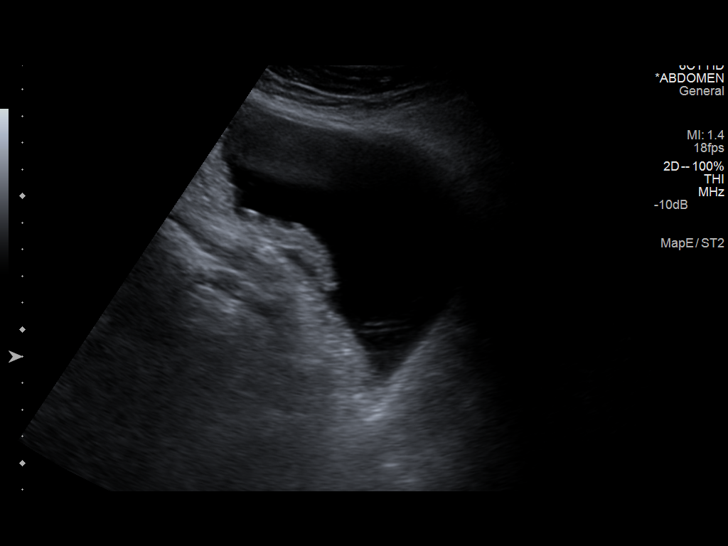

[10 of 10 positions shown; findings below may reference images not displayed]

PROCEDURE:
An ultrasound guided paracentesis was thoroughly discussed with the
patient and questions answered. The benefits, risks, alternatives
and complications were also discussed. The patient understands and
wishes to proceed with the procedure. Written consent was obtained.

Ultrasound was performed to localize and mark an adequate pocket of
fluid in the left lower quadrant of the abdomen. The area was then
prepped and draped in the normal sterile fashion. 1% Lidocaine was
used for local anesthesia. Under ultrasound guidance a 19 gauge Yueh
catheter was introduced. Paracentesis was performed. The catheter
was removed and a dressing applied.

Complications: None.
FINDINGS: A total of approximately 5 liters of amber fluid was removed.
IMPRESSION: Successful ultrasound guided therapeutic paracentesis yielding 5
liters of ascites.

## 2015-06-28 IMAGING — CR DG ABDOMEN 1V
2 series · 2 of 2 positions shown · non-contrast
Comparison: None.

CLINICAL DATA: Vomiting and diarrhea.

EXAM:
ABDOMEN - 1 VIEW

[t abdomen supine (1 of 2)]
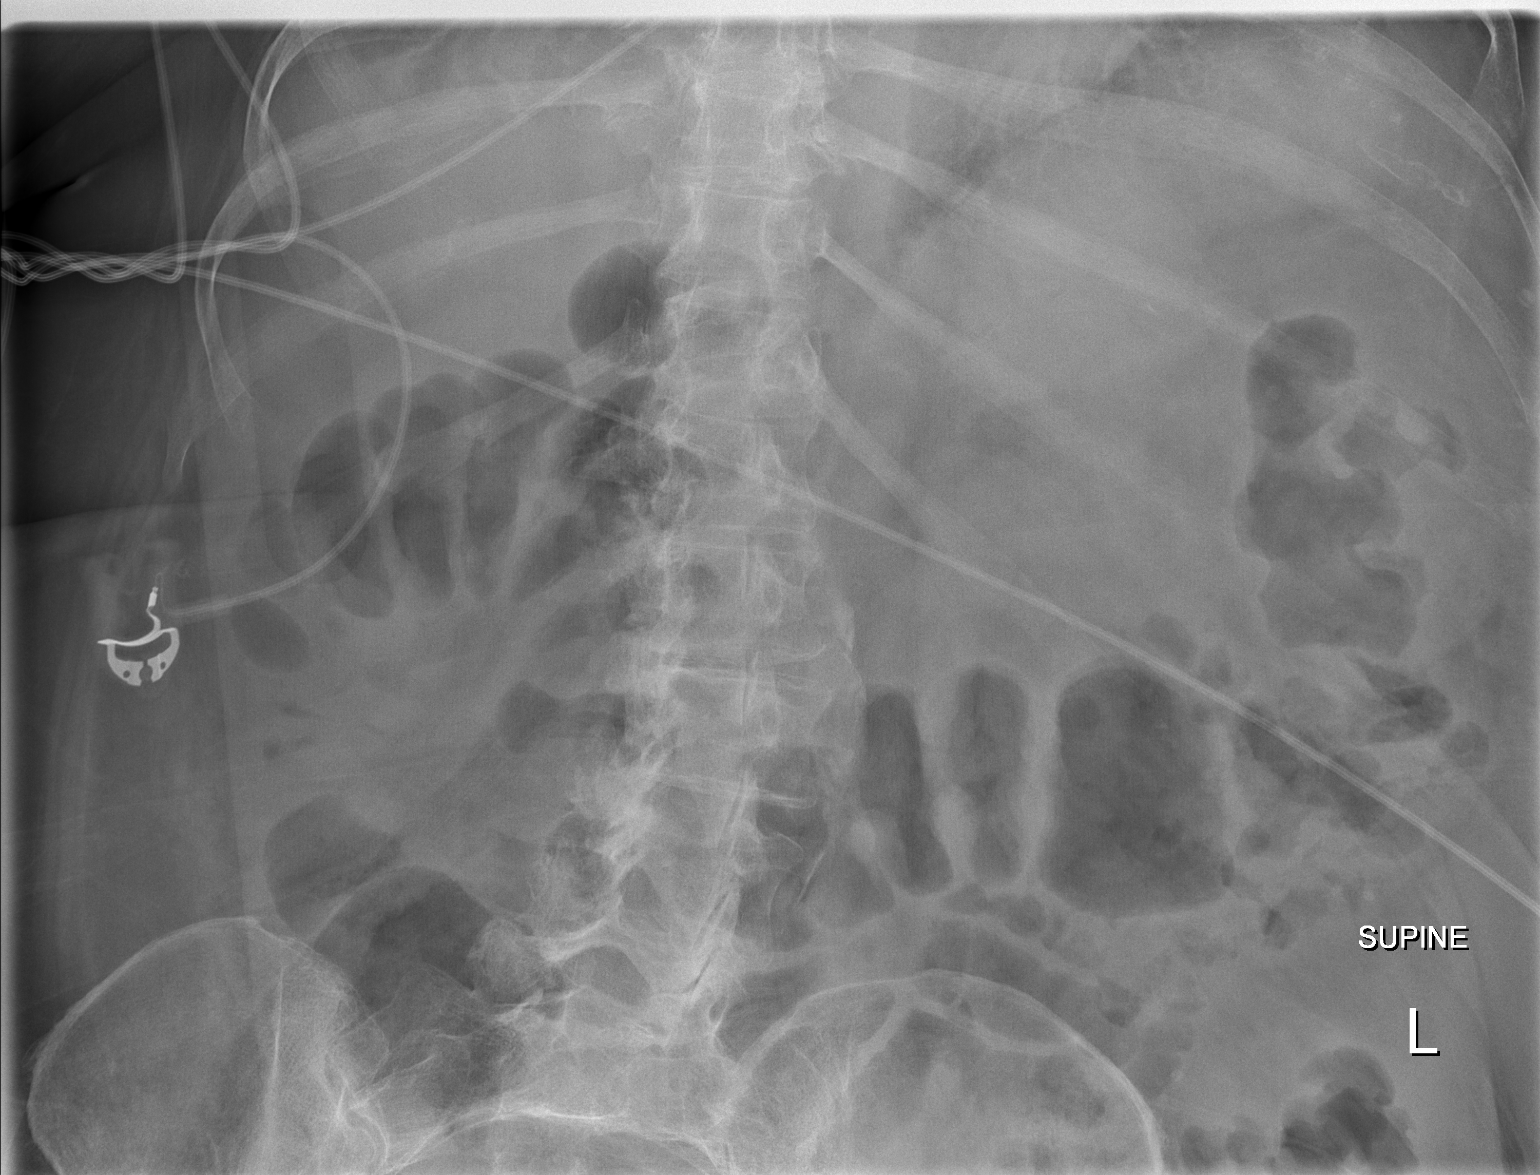

[t abdomen supine (2 of 2)]
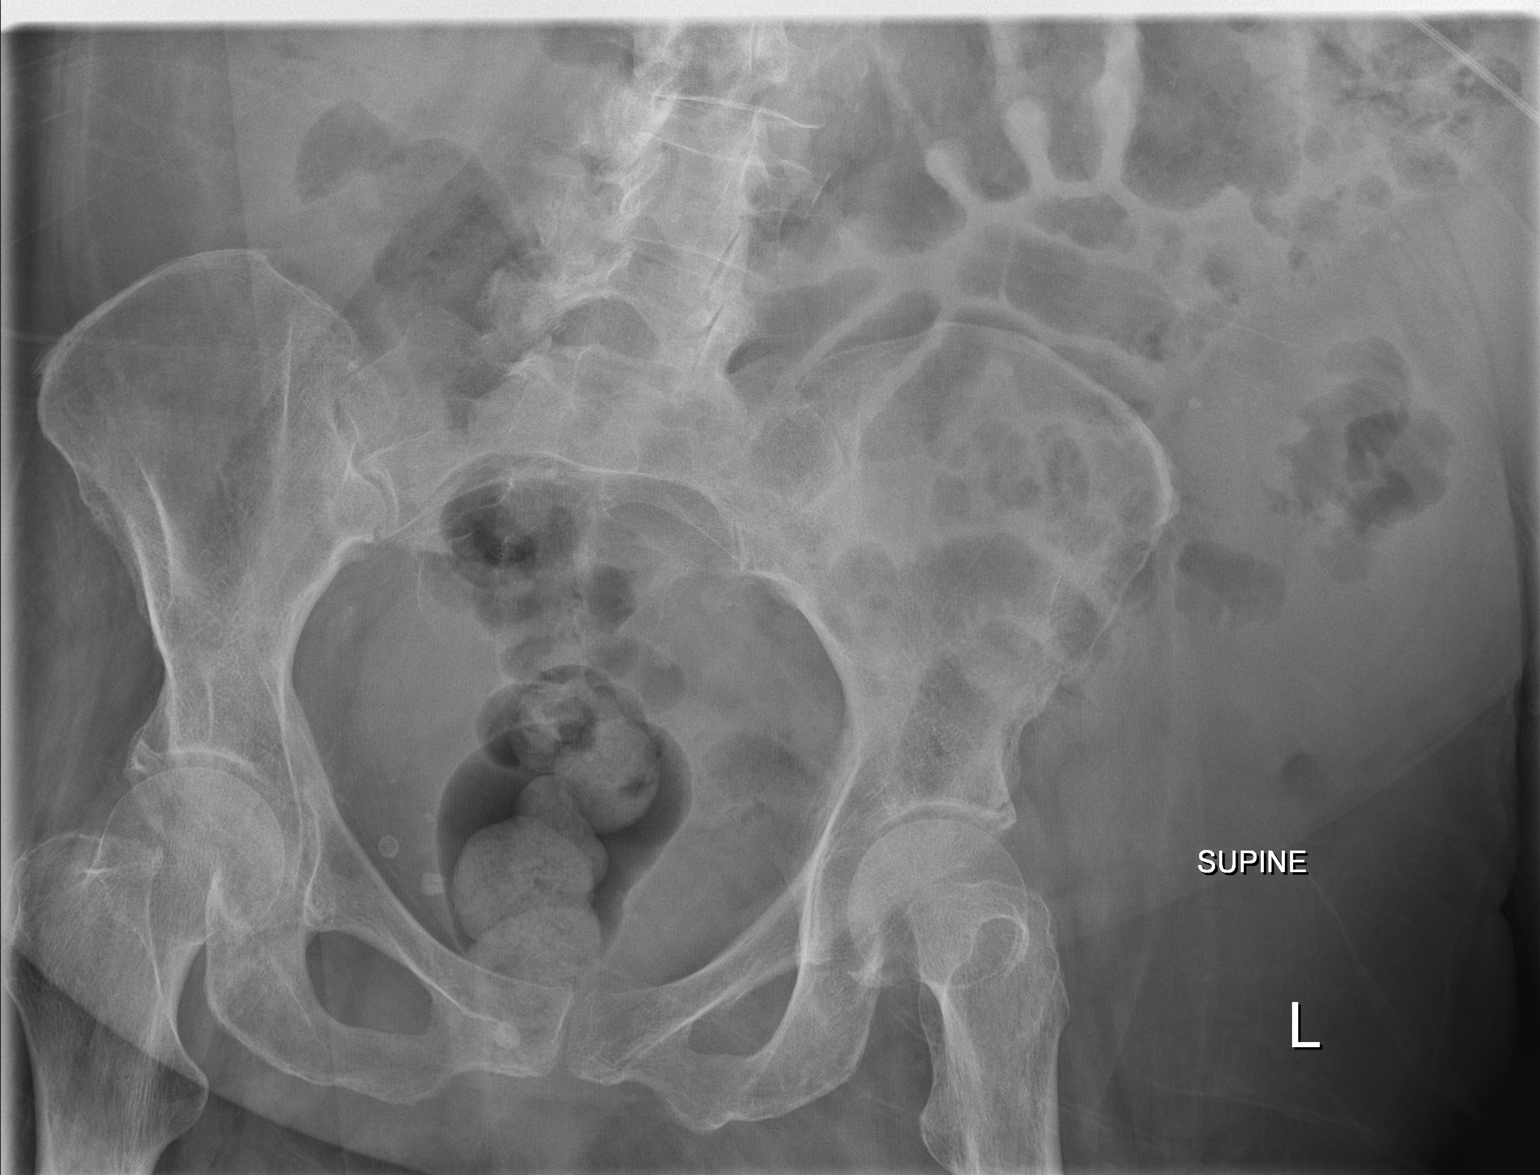

[2 of 2 positions shown; findings below may reference images not displayed]

FINDINGS: The patient is rotated to the left on today's images.

There is gas within nondilated small bowel and gas and formed stool
in the colon. Vascular calcifications noted in the anatomic pelvis.
There is evidence of lumbar spondylosis.
IMPRESSION: 1. Unremarkable bowel gas pattern.
2. Lumbar spondylosis.
